# Patient Record
Sex: Female | Born: 1955
Health system: Southern US, Community
[De-identification: ages and names within clinical notes are randomized; demographics above are authoritative.]

## PROBLEM LIST (undated history)

## (undated) DIAGNOSIS — D696 Thrombocytopenia, unspecified: Secondary | ICD-10-CM

## (undated) DIAGNOSIS — R51 Headache: Secondary | ICD-10-CM

## (undated) DIAGNOSIS — M1712 Unilateral primary osteoarthritis, left knee: Secondary | ICD-10-CM

## (undated) DIAGNOSIS — E039 Hypothyroidism, unspecified: Secondary | ICD-10-CM

## (undated) DIAGNOSIS — K219 Gastro-esophageal reflux disease without esophagitis: Secondary | ICD-10-CM

## (undated) DIAGNOSIS — C801 Malignant (primary) neoplasm, unspecified: Secondary | ICD-10-CM

## (undated) DIAGNOSIS — E119 Type 2 diabetes mellitus without complications: Secondary | ICD-10-CM

## (undated) DIAGNOSIS — K589 Irritable bowel syndrome without diarrhea: Secondary | ICD-10-CM

## (undated) HISTORY — DX: Type 2 diabetes mellitus without complications: E11.9

## (undated) HISTORY — DX: Unilateral primary osteoarthritis, left knee: M17.12

## (undated) HISTORY — DX: Thrombocytopenia, unspecified: D69.6

## (undated) HISTORY — PX: ABDOMINAL HYSTERECTOMY: SHX81

## (undated) HISTORY — PX: OTHER SURGICAL HISTORY: SHX169

## (undated) HISTORY — DX: Malignant (primary) neoplasm, unspecified: C80.1

## (undated) HISTORY — PX: TONSILLECTOMY AND ADENOIDECTOMY: SHX28

---
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
## 1989-12-09 HISTORY — PX: APPENDECTOMY: SHX54

## 2004-04-17 ENCOUNTER — Encounter: Payer: Self-pay | Admitting: Internal Medicine

## 2004-05-16 ENCOUNTER — Ambulatory Visit (HOSPITAL_COMMUNITY): Admission: RE | Admit: 2004-05-16 | Discharge: 2004-05-16 | Payer: Self-pay | Admitting: Internal Medicine

## 2005-10-02 ENCOUNTER — Ambulatory Visit: Payer: Self-pay | Admitting: Internal Medicine

## 2009-02-20 ENCOUNTER — Encounter: Payer: Self-pay | Admitting: Internal Medicine

## 2009-03-22 ENCOUNTER — Encounter: Payer: Self-pay | Admitting: Internal Medicine

## 2010-12-24 ENCOUNTER — Ambulatory Visit
Admission: RE | Admit: 2010-12-24 | Discharge: 2010-12-24 | Payer: Self-pay | Source: Home / Self Care | Attending: Internal Medicine | Admitting: Internal Medicine

## 2010-12-24 ENCOUNTER — Other Ambulatory Visit: Payer: Self-pay | Admitting: Internal Medicine

## 2010-12-24 LAB — URINALYSIS
Bilirubin Urine: NEGATIVE
Ketones, ur: NEGATIVE
Leukocytes, UA: NEGATIVE
Nitrite: NEGATIVE
Specific Gravity, Urine: 1.01 (ref 1.000–1.030)
Total Protein, Urine: NEGATIVE
Urine Glucose: NEGATIVE
Urobilinogen, UA: 0.2 (ref 0.0–1.0)
pH: 6 (ref 5.0–8.0)

## 2010-12-24 LAB — BASIC METABOLIC PANEL
BUN: 10 mg/dL (ref 6–23)
CO2: 26 mEq/L (ref 19–32)
Calcium: 8.7 mg/dL (ref 8.4–10.5)
Chloride: 103 mEq/L (ref 96–112)
Creatinine, Ser: 1 mg/dL (ref 0.4–1.2)
GFR: 63.68 mL/min (ref >60.00)
Glucose, Bld: 91 mg/dL (ref 70–99)
Potassium: 3.9 mEq/L (ref 3.5–5.1)
Sodium: 137 mEq/L (ref 135–145)

## 2010-12-24 LAB — CBC WITH DIFFERENTIAL/PLATELET
Basophils Absolute: 0.1 10*3/uL (ref 0.0–0.1)
Basophils Relative: 0.8 % (ref 0.0–3.0)
Eosinophils Absolute: 0.3 10*3/uL (ref 0.0–0.7)
Eosinophils Relative: 5 % (ref 0.0–5.0)
HCT: 45.3 % (ref 36.0–46.0)
Hemoglobin: 15.6 g/dL — ABNORMAL HIGH (ref 12.0–15.0)
Lymphocytes Relative: 35.7 % (ref 12.0–46.0)
Lymphs Abs: 2.3 10*3/uL (ref 0.7–4.0)
MCHC: 34.4 g/dL (ref 30.0–36.0)
MCV: 92.3 fl (ref 78.0–100.0)
Monocytes Absolute: 0.6 10*3/uL (ref 0.1–1.0)
Monocytes Relative: 9.9 % (ref 3.0–12.0)
Neutro Abs: 3.1 10*3/uL (ref 1.4–7.7)
Neutrophils Relative %: 48.6 % (ref 43.0–77.0)
Platelets: 149 10*3/uL — ABNORMAL LOW (ref 150.0–400.0)
RBC: 4.91 Mil/uL (ref 3.87–5.11)
RDW: 13.8 % (ref 11.5–14.6)
WBC: 6.5 10*3/uL (ref 4.5–10.5)

## 2010-12-24 LAB — HEPATIC FUNCTION PANEL
ALT: 66 U/L — ABNORMAL HIGH (ref 0–35)
AST: 48 U/L — ABNORMAL HIGH (ref 0–37)
Albumin: 4 g/dL (ref 3.5–5.2)
Alkaline Phosphatase: 72 U/L (ref 39–117)
Bilirubin, Direct: 0.2 mg/dL (ref 0.0–0.3)
Total Bilirubin: 1 mg/dL (ref 0.3–1.2)
Total Protein: 6.8 g/dL (ref 6.0–8.3)

## 2010-12-24 LAB — LIPID PANEL
Cholesterol: 144 mg/dL (ref 0–200)
HDL: 63.3 mg/dL (ref >39.00)
LDL Cholesterol: 63 mg/dL (ref 0–99)
Total CHOL/HDL Ratio: 2
Triglycerides: 88 mg/dL (ref 0.0–149.0)
VLDL: 17.6 mg/dL (ref 0.0–40.0)

## 2010-12-24 LAB — TSH: TSH: 1.83 u[IU]/mL (ref 0.35–5.50)

## 2011-01-07 ENCOUNTER — Encounter: Payer: Self-pay | Admitting: Internal Medicine

## 2011-01-07 ENCOUNTER — Ambulatory Visit
Admission: RE | Admit: 2011-01-07 | Discharge: 2011-01-07 | Payer: Self-pay | Source: Home / Self Care | Attending: Internal Medicine | Admitting: Internal Medicine

## 2011-01-07 DIAGNOSIS — K760 Fatty (change of) liver, not elsewhere classified: Secondary | ICD-10-CM | POA: Insufficient documentation

## 2011-01-07 DIAGNOSIS — E039 Hypothyroidism, unspecified: Secondary | ICD-10-CM | POA: Insufficient documentation

## 2011-01-07 DIAGNOSIS — R079 Chest pain, unspecified: Secondary | ICD-10-CM | POA: Insufficient documentation

## 2011-01-07 DIAGNOSIS — K589 Irritable bowel syndrome without diarrhea: Secondary | ICD-10-CM | POA: Insufficient documentation

## 2011-01-15 ENCOUNTER — Other Ambulatory Visit: Payer: Self-pay | Admitting: Internal Medicine

## 2011-01-16 NOTE — Assessment & Plan Note (Signed)
Summary: NEW PT CPX/ CHEST PAIN WHEN EXERCISES/NWS  #   Vital Signs:  Patient profile:   55 year old female Height:      68 inches (172.72 cm) Weight:      251.12 pounds (114.15 kg) BMI:     38.32 O2 Sat:      95 % on Room air Temp:     98.4 degrees F (36.89 degrees C) oral Pulse rate:   78 / minute BP sitting:   142 / 88  (left arm) Cuff size:   regular  Vitals Entered By: Orlan Leavens RMA (January 07, 2011 2:03 PM)  O2 Flow:  Room air CC: New patient CPX Is Patient Diabetic? No Pain Assessment Patient in pain? no      Comments PT WANT SHINGLE INJECTION ALSO   Primary Care Provider:  Newt Lukes MD  CC:  New patient CPX.  History of Present Illness: new pt to me and our practice, here to est care  patient is here today for annual physical. Patient feels well and has no complaints.   c/o CP with exettion -  onset   Preventive Screening-Counseling & Management  Alcohol-Tobacco     Alcohol drinks/day: 0     Alcohol Counseling: not indicated; patient does not drink     Smoking Status: never     Tobacco Counseling: not indicated; no tobacco use  Caffeine-Diet-Exercise     Nutrition Referrals: no     Does Patient Exercise: no     Exercise Counseling: to improve exercise regimen     Depression Counseling: not indicated; screening negative for depression  Safety-Violence-Falls     Seat Belt Counseling: not indicated; patient wears seat belts     Helmet Counseling: not applicable     Firearm Counseling: not indicated; uses recommended firearm safety measures     Violence Counseling: not indicated; no violence risk noted     Fall Risk Counseling: not indicated; no significant falls noted  Clinical Review Panels:  Prevention   [facility] Impression:. Small external hemorrhoid. Otherwise normal colonscopy (05/16/2004)  Immunizations   Last Tetanus Booster:  Tdap (01/07/2011)   Last Flu Vaccine:  Historical (10/09/2010)  Lipid  Management   Cholesterol:  144 (12/24/2010)   LDL (bad choesterol):  63 (12/24/2010)   HDL (good cholesterol):  63.30 (12/24/2010)  CBC   WBC:  6.5 (12/24/2010)   RBC:  4.91 (12/24/2010)   Hgb:  15.6 (12/24/2010)   Hct:  45.3 (12/24/2010)   Platelets:  149.0 (12/24/2010)   MCV  92.3 (12/24/2010)   MCHC  34.4 (12/24/2010)   RDW  13.8 (12/24/2010)   PMN:  48.6 (12/24/2010)   Lymphs:  35.7 (12/24/2010)   Monos:  9.9 (12/24/2010)   Eosinophils:  5.0 (12/24/2010)   Basophil:  0.8 (12/24/2010)  Complete Metabolic Panel   Glucose:  91 (12/24/2010)   Sodium:  137 (12/24/2010)   Potassium:  3.9 (12/24/2010)   Chloride:  103 (12/24/2010)   CO2:  26 (12/24/2010)   BUN:  10 (12/24/2010)   Creatinine:  1.0 (12/24/2010)   Albumin:  4.0 (12/24/2010)   Total Protein:  6.8 (12/24/2010)   Calcium:  8.7 (12/24/2010)   Total Bili:  1.0 (12/24/2010)   Alk Phos:  72 (12/24/2010)   SGPT (ALT):  66 (12/24/2010)   SGOT (AST):  48 (12/24/2010)   Current Medications (verified): 1)  Levothyroxine Sodium 100 Mcg Tabs (Levothyroxine Sodium) .... Take 1 By Mouth Once Daily 2)  Hyoscyamine Sulfate 0.125 Mg Tabs (Hyoscyamine Sulfate) .... Take 1 By Mouth Once Daily  Allergies (verified): 1)  ! Darvocet 2)  ! 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 I have reviewed all other systems and they were negative.   Physical Exam  General:  overweight-appearing.  alert, well-developed, well-nourished, and cooperative to  examination.    Head:  Normocephalic and atraumatic without obvious abnormalities. No apparent alopecia or balding. Eyes:  vision grossly intact; pupils equal, round and reactive to light.  conjunctiva and lids normal.    Ears:  normal pinnae bilaterally, without erythema, swelling, or tenderness to palpation. TMs clear, without effusion, or cerumen impaction. Hearing grossly normal bilaterally  Mouth:  teeth and gums in good repair; mucous membranes moist, without lesions or ulcers. oropharynx clear without exudate, no erythema.  Neck:  supple, full ROM, no masses, no thyromegaly; no thyroid nodules or tenderness. no JVD or carotid bruits.   Lungs:  normal respiratory effort, no intercostal retractions or use of accessory muscles; normal breath sounds bilaterally - no crackles and no wheezes.    Heart:  normal rate, regular rhythm, no murmur, and no rub. BLE without edema.  Abdomen:  soft, non-tender, normal bowel sounds, no distention; no masses and no appreciable hepatomegaly or splenomegaly.   Genitalia:  defer gyn Msk:  No deformity or scoliosis noted of thoracic or lumbar spine.   Neurologic:  alert & oriented X3 and cranial nerves II-XII symetrically intact.  strength normal in all extremities, sensation intact to light touch, and gait normal. speech fluent without dysarthria or aphasia; follows commands with good comprehension.  Skin:  no rashes, vesicles, ulcers, or erythema. No nodules or irregularity to palpation.  Psych:  Oriented X3, memory intact for recent and remote, normally interactive, good eye contact, not anxious appearing, not depressed appearing, and not agitated.      Complete Medication List: 1)  Levothyroxine Sodium 100 Mcg Tabs (Levothyroxine sodium) .... Take 1 by mouth once daily 2)  Hyoscyamine Sulfate 0.125 Mg Tabs (Hyoscyamine sulfate) .... Take 1 by mouth once daily  Other Orders: Tdap => 30yrs IM (81191) Admin 1st Vaccine (47829) EKG w/ Interpretation  (93000) Cardiolite (Cardiolite) Radiology Referral (Radiology)  Patient Instructions: 1)  it was good to see you today. 2)  exam and labs reviewed today - chronic elevation liver tests but other tests ok - 3)  we'll make referral for cardiac stress test and for abdominal ultrasound. Our office will contact you regarding these appointments once made.  4)  No aggressive increase in exercise until clear from cardiac testing 5)  Please schedule a follow-up appointment in 6 months to monitor thyroid, call sooner if problems.    Orders Added: 1)  Tdap => 38yrs IM [90715] 2)  Admin 1st Vaccine [90471] 3)  EKG w/ Interpretation [93000] 4)  Cardiolite [Cardiolite] 5)  Radiology Referral [Radiology]   Immunization History:  Influenza Immunization History:    Influenza:  historical (10/09/2010)  Immunizations Administered:  Tetanus Vaccine:    Vaccine Type: Tdap    Site: right deltoid    Mfr: GlaxoSmithKline    Dose: 0.5 ml    Route: IM    Given by: Orlan Leavens RMA    Exp. Date: 09/27/2012    Lot #: [id number]    VIS given: 01/07/11   Immunization History:  Influenza Immunization History:    Influenza:  Historical (10/09/2010)  Immunizations Administered:  Tetanus Vaccine:    Vaccine Type: Tdap    Site: right deltoid    Mfr: GlaxoSmithKline    Dose: 0.5 ml    Route: IM    Given by: Orlan Leavens RMA    Exp. Date: 09/27/2012    Lot #: [id number]    VIS given: 01/07/11    Appended Document: NEW PT CPX/ CHEST PAIN WHEN EXERCISES/NWS  # above note accidentally signed rather than placed on hold -- here is addendum:  HPI -  new pt to me, here to est care patient is also here today for annual physical. Patient feels well and has no complaints.   also c/o CP onexertion onset 6 mo ago, course progressive - no symptoms at rest, only precipitated by treadmill activity pain symptoms located across SS chest  a/w DOE and occ dizzy symptoms, no syncope - +hx same but no prior  cardiac eval -  PMH/SurgHx/FH and Soc Hxas above ROS as above PE as above - labs and review as above  A/P - 1) CPX (v70.0) - Patient has been counseled on age-appropriate routine health concerns for screening and prevention. These are reviewed and up-to-date. Immunizations are up-to-date or declined. Labs and ECG reviewed.  2) CP (786.50) - nonsp exam but few mo progressive exertional CP - symptoms resolve with cessation of treadmill exercise -  may be deconditioning --but given nonsp TWI on EKG and age, feel approp to risk startify with stress test - see order above for cardiolyte - hold on plans for intense exercise routine until cardiac eval complete - consider GI eval and PPI or tx IBS if no ischemia on stress test - explained same to pt who understands and agrees  3) elev LFTs - chronic - dates back to 04/2004 per prior GI records and labs brought with pt today no clear GI or bilary symptoms - ?related to exertion CP nontender exam - ?NASH per prior GI note [place] 02/2008 reviewed - normal anatomy w/o stones, mass recehck [place] now and considerHIDA pt reports neg eval for hepatitis and other "liver probes" in 2005 by GI doc - will send for copy of these records - no EtOH or hx IVDA  4) hypothyroid (244.9)- labs reviewed - cont same   Clinical Lists Changes  Orders: Added new Service order of New Patient 40-64 years (41324) - Signed Added new Service order of New Patient Level III [phone]) - Signed Observations: Added new observation of BASOPHIL %: 1 % (03/22/2009 16:24) Added new observation of % EOS AUTO: 3 % (03/22/2009 16:24) Added new observation of MONOCYTE %: 8 % (03/22/2009 16:24) Added new observation of LYMPH %: 46 % (03/22/2009 16:24) Added new observation of PMN %: 42 % (03/22/2009 16:24) Added new observation of RDW: 13.5 % (03/22/2009 16:24) Added new observation of MCV: 92 fL (03/22/2009 16:24) Added new observation of PLATELETK/UL: 177 K/uL (03/22/2009 16:24) Added  new observation of RBC M/UL: 4.91 M/uL (03/22/2009 16:24) Added new observation of HCT: 45.3 % (03/22/2009 16:24) Added new observation of HGB: 15.2 g/dL (72/53/6644 03:47) Added  new observation of WBC COUNT: 6.0 10*3/microliter (03/22/2009 16:24) Added new observation of TSH: 1.690 microintl units/mL (03/22/2009 16:23) Added new observation of TRIGLYCERIDE: 122 mg/dL (65/78/4696 29:52) Added new observation of HDL: 59 mg/dL (84/13/2440 10:27) Added new observation of LDL: 54 mg/dL (25/36/6440 34:74) Added new observation of CHOLESTEROL: 137 mg/dL (25/95/6387 56:43) Added new observation of ALBUMIN: 4.1 g/dL (32/95/1884 16:60) Added new observation of PROTEIN, TOT: 6.6 g/dL (63/12/6008 93:23) Added new observation of CALCIUM: 8.6 mg/dL (55/73/2202 54:27) Added new observation of ALK PHOS: 80 units/L (03/22/2009 16:23) Added new observation of BILI TOTAL: 0.6 mg/dL (06/01/7627 31:51) Added new observation of SGPT (ALT): 49 units/L (03/22/2009 16:23) Added new observation of SGOT (AST): 29 units/L (03/22/2009 16:23) Added new observation of CO2 PLSM/SER: 18 meq/L (03/22/2009 16:23) Added new observation of CL SERUM: 109 meq/L (03/22/2009 16:23) Added new observation of K SERUM: 4.2 meq/L (03/22/2009 16:23) Added new observation of NA: 139 meq/L (03/22/2009 16:23) Added new observation of CREATININE: 0.81 mg/dL (76/16/0737 10:62) Added new observation of BUN: 9 mg/dL (69/48/5462 70:35) Added new observation of BG RANDOM: 105 mg/dL (00/93/8182 99:37) Added new observation of ABDOM US: Done @ [facility] in [place]  Normal examination  (02/20/2009 16:21)       [place] of Abdomen  Procedure date:  02/20/2009  Findings:      Done @ [facility] in [place]  Normal examination    -  Date:  03/22/2009    Triglycerides: 122    Lymphs: 46

## 2011-01-17 ENCOUNTER — Other Ambulatory Visit: Payer: Self-pay | Admitting: Internal Medicine

## 2011-01-17 DIAGNOSIS — R079 Chest pain, unspecified: Secondary | ICD-10-CM

## 2011-01-17 DIAGNOSIS — R7401 Elevation of levels of liver transaminase levels: Secondary | ICD-10-CM

## 2011-01-21 ENCOUNTER — Ambulatory Visit
Admission: RE | Admit: 2011-01-21 | Discharge: 2011-01-21 | Disposition: A | Discharge status: Home / Self Care | Payer: BC Managed Care – PPO | Source: Ambulatory Visit | Attending: Internal Medicine | Admitting: Internal Medicine

## 2011-01-21 DIAGNOSIS — R7401 Elevation of levels of liver transaminase levels: Secondary | ICD-10-CM

## 2011-01-21 DIAGNOSIS — R079 Chest pain, unspecified: Secondary | ICD-10-CM

## 2011-01-24 NOTE — Letter (Signed)
Summary: [facility]  [facility]   Imported By: Sherian Rein 01/18/2011 10:32:28  _____________________________________________________________________  External Attachment:    Type:   Image     Comment:   External Document

## 2011-02-04 ENCOUNTER — Telehealth (INDEPENDENT_AMBULATORY_CARE_PROVIDER_SITE_OTHER): Payer: Self-pay | Admitting: Radiology

## 2011-02-05 ENCOUNTER — Encounter: Payer: Self-pay | Admitting: Cardiology

## 2011-02-05 ENCOUNTER — Ambulatory Visit (HOSPITAL_COMMUNITY): Payer: BC Managed Care – PPO | Attending: Internal Medicine

## 2011-02-05 DIAGNOSIS — R079 Chest pain, unspecified: Secondary | ICD-10-CM | POA: Insufficient documentation

## 2011-02-05 DIAGNOSIS — R0789 Other chest pain: Secondary | ICD-10-CM

## 2011-02-05 DIAGNOSIS — R0989 Other specified symptoms and signs involving the circulatory and respiratory systems: Secondary | ICD-10-CM

## 2011-02-05 DIAGNOSIS — R0602 Shortness of breath: Secondary | ICD-10-CM

## 2011-02-06 ENCOUNTER — Ambulatory Visit (HOSPITAL_COMMUNITY): Payer: BC Managed Care – PPO

## 2011-02-06 ENCOUNTER — Encounter: Payer: Self-pay | Admitting: Cardiology

## 2011-02-06 DIAGNOSIS — R0989 Other specified symptoms and signs involving the circulatory and respiratory systems: Secondary | ICD-10-CM

## 2011-02-14 NOTE — Assessment & Plan Note (Signed)
Summary: Cardiology Nuclear Testing  Nuclear Med Background Indications for Stress Test: Evaluation for Ischemia     Symptoms: Chest Pressure, Chest Pressure with Exertion, Chest Tightness, Chest Tightness with Exertion, Dizziness, DOE, Fatigue, Palpitations, SOB    Nuclear Pre-Procedure Caffeine/Decaff Intake: none NPO After: 6:30 PM Lungs: clear IV 0.9% NS with Angio Cath: 20g     IV Site: R Hand IV Started by: Milana Na, EMT-P Chest Size (in) 44     Cup Size DD     Height (in): 68 Weight (lb): 248 BMI: 37.84  Nuclear Med Study 1 or 2 day study:  2 day     Stress Test Type:  Stress Reading MD:  Marca Ancona, MD     Referring MD:  V.Leschber Resting Radionuclide:  Technetium 34m Tetrofosmin     Resting Radionuclide Dose:  33.0 mCi  Stress Radionuclide:  Technetium 92m Tetrofosmin     Stress Radionuclide Dose:  33.0 mCi   Stress Protocol Exercise Time (min):  6:30 min     Max HR:  160 bpm     Predicted Max HR:  163 bpm  Max Systolic BP: 194 mm Hg     Percent Max HR:  98.16 %     METS: 7.7 Rate Pressure Product:  60454    Stress Test Technologist:  Milana Na, EMT-P     Nuclear Technologist:  Domenic Polite, CNMT  Rest Procedure  Myocardial perfusion imaging was performed at rest 45 minutes following the intravenous administration of Technetium 60m Tetrofosmin.  Stress Procedure  The patient exercised for 6:30. The patient stopped due to fatigue, sob,and denied any chest pain.  There were non significant ST-T wave changes.  Technetium 15m Tetrofosmin was injected at peak exercise and myocardial perfusion imaging was performed after a brief delay.  QPS Raw Data Images:  Normal; no motion artifact; normal heart/lung ratio. Stress Images:  Normal homogeneous uptake in all areas of the myocardium. Rest Images:  Normal homogeneous uptake in all areas of the myocardium. Subtraction (SDS):  There is no evidence of scar or ischemia. Transient Ischemic Dilatation:   1.06  (Normal <1.22)  Lung/Heart Ratio:  0.32  (Normal <0.45)  Quantitative Gated Spect Images QGS EDV:  63 ml QGS ESV:  15 ml QGS EF:  76 % QGS cine images:  Normal wall motion   Overall Impression  Exercise Capacity: Fair exercise capacity. BP Response: Hypertensive blood pressure response. Clinical Symptoms: Short of breath ECG Impression: No significant ST segment change suggestive of ischemia. Overall Impression: Normal stress nuclear study.

## 2011-02-14 NOTE — Progress Notes (Signed)
Summary: Nuc Pre-Procedure  Phone Note Outgoing Call Call back at Home Phone [phone]   Call placed by: Henrine Screws Call placed to: Patient Reason for Call: Confirm/change Appt Summary of Call: Left message with information on [employer] Information Sheet (see scanned document for details).      Nuclear Med Background Indications for Stress Test: Evaluation for Ischemia     Symptoms: Chest Pain with Exertion, Dizziness, DOE    Nuclear Pre-Procedure Height (in): 68

## 2011-02-25 ENCOUNTER — Telehealth: Payer: Self-pay | Admitting: Internal Medicine

## 2011-02-26 ENCOUNTER — Telehealth: Payer: Self-pay

## 2011-02-26 NOTE — Telephone Encounter (Signed)
Pt called to request results of stress test. Per VAL,results reviewed - normal nuc stress test - no ischemia or heart problems noted - ok to increase exercise program as tolerated - thanks.  Pt advised of same and requested results of [place] And. Pt states she is still having mild chest tightning and believes it is related to her stomach. Please advise if you have seen [place] results, pt was scheduled 02/13.

## 2011-02-26 NOTE — Telephone Encounter (Signed)
i reviewed [place] results from 01/2011 - normal - if continued pain sx, will need ROV to review next step - thanks

## 2011-03-07 NOTE — Progress Notes (Signed)
Summary: test results?  Phone Note Call from Patient Call back at Work Phone [phone]   Caller: Patient Summary of Call: Pt called requesting stress test results. Initial call taken by: Margaret Pyle, CMA,  February 25, 2011 2:32 PM  Follow-up for Phone Call        results reviewed - normal nuc stress test - no ischemia or heart problems noted - ok to increase exercise program as tolerated - thanks Follow-up by: Newt Lukes MD,  February 26, 2011 9:48 AM  Additional Follow-up for Phone Call Additional follow up Details #1::        Done per [facility] Additional Follow-up by: Margaret Pyle, CMA,  February 26, 2011 2:58 PM

## 2011-03-27 ENCOUNTER — Encounter: Payer: Self-pay | Admitting: Internal Medicine

## 2011-04-26 NOTE — Op Note (Signed)
NAME:  Shelly [name], Shelly [name]                      ACCOUNT NO.:  [id number]   MEDICAL RECORD NO.:  [id number]                   PATIENT TYPE:  AMB   LOCATION:  DAY                                  FACILITY:  [facility]   PHYSICIAN:  Lionel December, M.D.                 DATE OF BIRTH:  1956/10/25   DATE OF PROCEDURE:  05/16/2004  DATE OF DISCHARGE:                                 OPERATIVE REPORT   PROCEDURE:  Total colonoscopy.   INDICATIONS FOR PROCEDURE:  The patient is a 56 year old Caucasian female  who is undergoing screening colonoscopy.  While she does not have a first-  degree relative with colon carcinoma, she had a maternal aunt and uncle who  had colon carcinoma in their 13s or 20s.  The procedure and risks were  reviewed with the patient, and informed consent was obtained.   PREOPERATIVE MEDICATIONS:  Demerol 50 mg IV, Versed 6 mg IV in divided dose.   FINDINGS:  The procedure was performed in the endoscopy suite.  The  patient's vital signs and O2 saturations were monitored during the procedure  and remained stable.  The patient was placed in the left lateral recumbent  position and rectal examination performed.  No  Abnormality noted on external or digital exam.  Please note that since I saw  her in the office she had noted some pressure or fullness in her anorectal  area, but this has gotten better.  The Olympus videoscope was placed into  the rectum and advanced into the region of the sigmoid colon and beyond.  The preparation was satisfactory.  The cecum had to be washed free of the  stool to see the underlying mucosa as well as the appendiceal orifice and  ileocecal valve.  Pictures were taken for the record.  A short segment of TI  was also examined and was normal.  As the scope was withdrawn, the colonic  mucosa was once again carefully examined and was normal throughout.  The  rectal mucosa similarly was normal.  The scope was retroflexed to examine  the anorectal  junction, and she had a tiny papilla and hemorrhoid below the  dentate line.  The endoscope was straightened and withdrawn.  The patient  tolerated the procedure well.   FINAL DIAGNOSIS:  Small external hemorrhoid.  Otherwise normal colonoscopy.   RECOMMENDATIONS:  1. High fiber diet plus Citrucel one tablespoon full daily.  2. Anusol-HC suppository, one per rectum at bedtime for one to two weeks.     Prescription given for 15 with a refill.  3. She should continue yearly Hemoccults and consider having next exam in 10     years from now.      ___________________________________________  Lionel December, M.D.   NR/MEDQ  D:  05/16/2004  T:  05/17/2004  Job:  [id number]

## 2011-06-03 ENCOUNTER — Ambulatory Visit (INDEPENDENT_AMBULATORY_CARE_PROVIDER_SITE_OTHER): Payer: BC Managed Care – PPO | Admitting: Internal Medicine

## 2011-06-03 DIAGNOSIS — K6289 Other specified diseases of anus and rectum: Secondary | ICD-10-CM

## 2011-07-01 ENCOUNTER — Encounter (INDEPENDENT_AMBULATORY_CARE_PROVIDER_SITE_OTHER): Payer: Self-pay | Admitting: *Deleted

## 2011-07-03 ENCOUNTER — Telehealth (INDEPENDENT_AMBULATORY_CARE_PROVIDER_SITE_OTHER): Payer: Self-pay | Admitting: *Deleted

## 2011-07-03 NOTE — Telephone Encounter (Signed)
Caryn Bee called about Shelly [name]. She went to Pharmacy to ask about over the counter medications to help with hemorrhoids. When he was going over the ones ,she had already tried them. He is wanting to know if she could try the Rectal Rocket, this is something that they do compound and it requires a RX.

## 2011-07-03 NOTE — Telephone Encounter (Signed)
Rx for Rectal Rocket has been called in to [facility] pharmacy

## 2011-07-19 DIAGNOSIS — K6289 Other specified diseases of anus and rectum: Secondary | ICD-10-CM

## 2011-07-19 DIAGNOSIS — K644 Residual hemorrhoidal skin tags: Secondary | ICD-10-CM

## 2011-07-19 DIAGNOSIS — D126 Benign neoplasm of colon, unspecified: Secondary | ICD-10-CM

## 2011-07-23 ENCOUNTER — Encounter (HOSPITAL_COMMUNITY)
Admission: RE | Disposition: A | Discharge status: Home / Self Care | Payer: Self-pay | Source: Ambulatory Visit | Attending: Internal Medicine

## 2011-07-23 ENCOUNTER — Ambulatory Visit (HOSPITAL_COMMUNITY)
Admission: RE | Admit: 2011-07-23 | Discharge: 2011-07-23 | Disposition: A | Discharge status: Home / Self Care | Payer: BC Managed Care – PPO | Source: Ambulatory Visit | Attending: Internal Medicine | Admitting: Internal Medicine

## 2011-07-23 ENCOUNTER — Other Ambulatory Visit (INDEPENDENT_AMBULATORY_CARE_PROVIDER_SITE_OTHER): Payer: Self-pay | Admitting: Internal Medicine

## 2011-07-23 ENCOUNTER — Encounter (HOSPITAL_COMMUNITY): Payer: Self-pay | Admitting: *Deleted

## 2011-07-23 DIAGNOSIS — D126 Benign neoplasm of colon, unspecified: Secondary | ICD-10-CM | POA: Insufficient documentation

## 2011-07-23 DIAGNOSIS — K644 Residual hemorrhoidal skin tags: Secondary | ICD-10-CM | POA: Insufficient documentation

## 2011-07-23 DIAGNOSIS — K6289 Other specified diseases of anus and rectum: Secondary | ICD-10-CM | POA: Insufficient documentation

## 2011-07-23 HISTORY — DX: Hypothyroidism, unspecified: E03.9

## 2011-07-23 HISTORY — DX: Irritable bowel syndrome, unspecified: K58.9

## 2011-07-23 HISTORY — DX: Gastro-esophageal reflux disease without esophagitis: K21.9

## 2011-07-23 HISTORY — PX: FLEXIBLE SIGMOIDOSCOPY: SHX5431

## 2011-07-23 HISTORY — DX: Headache: R51

## 2011-07-23 LAB — HM COLONOSCOPY

## 2011-07-23 SURGERY — SIGMOIDOSCOPY, FLEXIBLE
Anesthesia: Moderate Sedation

## 2011-07-23 MED ORDER — MIDAZOLAM HCL 5 MG/5ML IJ SOLN
INTRAMUSCULAR | Status: AC
  Filled 2011-07-23: qty 10

## 2011-07-23 MED ORDER — MEPERIDINE HCL 25 MG/ML IJ SOLN
INTRAMUSCULAR | Status: DC | PRN
  Administered 2011-07-23 (×2): 25 mg via INTRAVENOUS

## 2011-07-23 MED ORDER — MEPERIDINE HCL 50 MG/ML IJ SOLN
INTRAMUSCULAR | Status: AC
  Filled 2011-07-23: qty 1

## 2011-07-23 MED ORDER — SODIUM CHLORIDE 0.45 % IV SOLN
Freq: Once | INTRAVENOUS | Status: AC
  Administered 2011-07-23: 07:00:00 via INTRAVENOUS

## 2011-07-23 MED ORDER — MIDAZOLAM HCL 5 MG/5ML IJ SOLN
INTRAMUSCULAR | Status: DC | PRN
  Administered 2011-07-23: 3 mg via INTRAVENOUS
  Administered 2011-07-23: 2 mg via INTRAVENOUS

## 2011-07-23 NOTE — H&P (Signed)
Shelly [name] is an 55 y.o. female.   Chief Complaint: Recurrent rectal pain; patient is here for diagnostic flexible sigmoidoscopy HPI: Patient is 55 year old female who's been experiencing intermittent rectal pain which started suddenly about 3 months ago he has not noted any change in her bowel habits melena or rectal bleeding he has history of IBS and has diarrhea constipation normal stool. She remains on high fiber diet. She denies vaginal discharge or bleeding. Recent pelvic exam was normal. She has been using preparation H. which has helped somewhat; this symptom is worse if she sits for prolonged periods. Rectal exam in the office 7 weeks ago was normal and stool was guaiac-negative Patient's loss colonoscopy was in June 2005 revealing external hemorrhoids only.  Past Medical History  Diagnosis Date  . Hypothyroidism   . Headache   . GERD (gastroesophageal reflux disease)   . Irritable bowel syndrome (IBS)     Past Surgical History  Procedure Date  . Abdominal hysterectomy   . Cyst removed     right arm    Family History  Problem Relation Age of Onset  . Colon cancer Maternal Aunt   . Colon cancer Maternal Uncle    Social History:  reports that she has never smoked. She does not have any smokeless tobacco history on file. She reports that she drinks alcohol. She reports that she does not use illicit drugs.  Allergies:  Allergies  Allergen Reactions  . Propoxyphene Hcl   . Propoxyphene N-Acetaminophen     Medications Prior to Admission  Medication Dose Route Frequency Provider Last Rate Last Dose  . 0.45 % sodium chloride infusion   Intravenous Once Malissa Hippo, MD 20 mL/hr at 07/23/11 0727    . meperidine (DEMEROL) 50 MG/ML injection           . midazolam (VERSED) 5 MG/5ML injection            Medications Prior to Admission  Medication Sig Dispense Refill  . hyoscyamine (LEVBID) 0.375 MG 12 hr tablet Take 0.375 mg by mouth daily.        Marland Kitchen levothyroxine  (SYNTHROID, LEVOTHROID) 100 MCG tablet Take 100 mcg by mouth daily.        Marland Kitchen aluminum hydroxide-magnesium-alginic acid (GENATON 80-20) chewable tablet Chew 1 tablet by mouth 3 (three) times daily as needed.        Marland Kitchen ibuprofen (ADVIL,MOTRIN) 200 MG tablet Take 200 mg by mouth every 6 (six) hours as needed.          No results found for this or any previous visit (from the past 48 hour(s)). No results found.  Review of Systems  Constitutional: Negative for fever and weight loss.  Gastrointestinal: Positive for diarrhea and constipation. Negative for nausea, vomiting, abdominal pain, blood in stool and melena. Heartburn: occasional heartburn with certain foods.    Blood pressure 147/81, pulse 76, temperature 97 F (36.1 C), temperature source Oral, resp. rate 22, height 5\' 8"  (1.727 m), weight 250 lb (113.399 kg), SpO2 97.00%. Physical Exam  Constitutional: She appears well-developed.       Mildly obese  HENT:  Mouth/Throat: Oropharynx is clear and moist.  Eyes: Conjunctivae are normal. No scleral icterus.  Neck: No thyromegaly present.  Cardiovascular: Normal rate, regular rhythm and normal heart sounds.   No murmur heard. Respiratory: Breath sounds normal.  GI: Soft. She exhibits no distension and no mass. There is no tenderness.  Musculoskeletal: She exhibits no edema.  Lymphadenopathy:  She has no cervical adenopathy.  Neurological: She is alert.  Skin: Skin is warm and dry.     Assessment/Plan Recurrent rectal pain. Diagnostic flexible sigmoidoscopy  Erendida Wrenn U 07/23/2011, 7:42 AM

## 2011-07-23 NOTE — Op Note (Signed)
PROCEDURE REPORT   PATIENT:  Shelly [name]  MR#:  [id number] Birthdate:  [birth year], 55 y.o., female Endoscopist:  Dr. Malissa Hippo, MD Procedure Date: 07/23/2011  Procedure:   Flexible Sigmoidoscopy.  Indications: Intermittent rectal pain for the last 3 months. Patient's last colonoscopy was in June 2005 revealing external hemorrhoids.  Informed Consent:  Procedure and risks were reviewed with the patient and informed consent was obtained.  Medications:  Demerol 50  mg IV Versed 5  mg IV  Description of procedure: Patient was placed in left lateral recumbent position and rectal examination performed. No abnormality noted on external or digital exam. Pantex video scope was placed in the rectum and advanced  into the sigmoid colon and beyond.  Scope was passed into mid transverse colon where  she had formed stool. As the scope was slowly withdrawn mucosa was carefully examined. There was 5-6 mm polyp at splenic flexure. It was ablated via cold biopsy. Mucosa of rest of the colon was normal. Rectal mucosa similarly was normal. Scope was retroflexed to examine the anorectal junction and small hemorrhoids noted below the dentate line.  Scope was withdrawn. Patient tolerated the procedure well.  Findings:  5-6 mm polyp at splenic flexure. Mall external hemorrhoids.  Therapeutic/Diagnostic Maneuvers Performed:  Polyp ablated via cold biopsy  Complications:  None    Impression:  No evidence of proctitis. Single small polyp ablated via cold biopsy from splenic flexure. Mall external hemorrhoids.  Recommendations:  Resume usual medications. Continue high-fiber diet. Physician will contact you with biopsy results and further recommendations.  REHMAN,NAJEEB U  07/23/2011 8:10 AM  CC: Dr. No primary provider on file. & Dr. No ref. provider found

## 2011-07-30 ENCOUNTER — Encounter (INDEPENDENT_AMBULATORY_CARE_PROVIDER_SITE_OTHER): Payer: Self-pay | Admitting: *Deleted

## 2011-07-30 ENCOUNTER — Encounter (HOSPITAL_COMMUNITY): Payer: Self-pay | Admitting: Internal Medicine

## 2011-08-08 ENCOUNTER — Telehealth (INDEPENDENT_AMBULATORY_CARE_PROVIDER_SITE_OTHER): Payer: Self-pay | Admitting: *Deleted

## 2011-08-08 NOTE — Telephone Encounter (Signed)
Dr. Karilyn Cota , Lanora Manis left a voicemail for me stating that she was calling you with a progress report after having been on Ibuprofen for 10 days. She says that this did not help her pain and thought from a previous conversation with you that if it did not you were going to order other test. Please advise , Thank You.  Patient also left this number [phone].

## 2011-08-10 NOTE — Telephone Encounter (Signed)
Shelly [name]  please schedule her for abdominopelvic CT with contrast.

## 2011-08-13 ENCOUNTER — Other Ambulatory Visit (INDEPENDENT_AMBULATORY_CARE_PROVIDER_SITE_OTHER): Payer: Self-pay | Admitting: Internal Medicine

## 2011-08-13 DIAGNOSIS — R52 Pain, unspecified: Secondary | ICD-10-CM

## 2011-08-13 NOTE — Telephone Encounter (Signed)
CT A/P sch'd 08/15/11 @ 4:15 (4:00), need to pick up contrast day before -- auth# [id number], patient aware

## 2011-08-13 NOTE — Telephone Encounter (Signed)
Forwarded to Ann Tilley to address 

## 2011-08-15 ENCOUNTER — Ambulatory Visit (HOSPITAL_COMMUNITY)
Admission: RE | Admit: 2011-08-15 | Discharge: 2011-08-15 | Disposition: A | Discharge status: Home / Self Care | Payer: BC Managed Care – PPO | Source: Ambulatory Visit | Attending: Internal Medicine | Admitting: Internal Medicine

## 2011-08-15 DIAGNOSIS — K6289 Other specified diseases of anus and rectum: Secondary | ICD-10-CM | POA: Insufficient documentation

## 2011-08-15 DIAGNOSIS — R52 Pain, unspecified: Secondary | ICD-10-CM

## 2011-08-15 MED ORDER — IOHEXOL 300 MG/ML  SOLN
100.0000 mL | Freq: Once | INTRAMUSCULAR | Status: AC | PRN
  Administered 2011-08-15: 100 mL via INTRAVENOUS

## 2012-05-09 LAB — HM PAP SMEAR

## 2012-05-10 LAB — HM MAMMOGRAPHY

## 2013-03-10 LAB — BASIC METABOLIC PANEL: Potassium: 4.2 mmol/L (ref 3.4–5.3)

## 2013-03-10 LAB — LIPID PANEL
Cholesterol: 147 mg/dL (ref 0–200)
HDL: 63 mg/dL (ref 35–70)
LDL Cholesterol: 59 mg/dL

## 2013-03-10 LAB — CBC AND DIFFERENTIAL
Hemoglobin: 15.3 g/dL (ref 12.0–16.0)
Platelets: 152 10*3/uL (ref 150–399)

## 2013-03-10 LAB — HEPATIC FUNCTION PANEL: ALT: 49 U/L — AB (ref 7–35)

## 2013-04-22 ENCOUNTER — Other Ambulatory Visit (INDEPENDENT_AMBULATORY_CARE_PROVIDER_SITE_OTHER): Payer: Self-pay | Admitting: Internal Medicine

## 2013-06-18 ENCOUNTER — Encounter: Payer: Self-pay | Admitting: Internal Medicine

## 2013-06-18 ENCOUNTER — Ambulatory Visit (INDEPENDENT_AMBULATORY_CARE_PROVIDER_SITE_OTHER): Payer: BC Managed Care – PPO | Admitting: Internal Medicine

## 2013-06-18 ENCOUNTER — Other Ambulatory Visit (INDEPENDENT_AMBULATORY_CARE_PROVIDER_SITE_OTHER): Payer: BC Managed Care – PPO

## 2013-06-18 VITALS — BP 126/88 | HR 82 | Temp 98.2°F | Resp 16 | Wt 269.8 lb

## 2013-06-18 DIAGNOSIS — R2241 Localized swelling, mass and lump, right lower limb: Secondary | ICD-10-CM

## 2013-06-18 DIAGNOSIS — M722 Plantar fascial fibromatosis: Secondary | ICD-10-CM

## 2013-06-18 DIAGNOSIS — R229 Localized swelling, mass and lump, unspecified: Secondary | ICD-10-CM

## 2013-06-18 DIAGNOSIS — R7309 Other abnormal glucose: Secondary | ICD-10-CM

## 2013-06-18 DIAGNOSIS — E039 Hypothyroidism, unspecified: Secondary | ICD-10-CM

## 2013-06-18 DIAGNOSIS — R739 Hyperglycemia, unspecified: Secondary | ICD-10-CM

## 2013-06-18 LAB — HEMOGLOBIN A1C: Hgb A1c MFr Bld: 6.8 % — ABNORMAL HIGH (ref 4.6–6.5)

## 2013-06-18 NOTE — Progress Notes (Signed)
  Subjective:    Patient ID: Shelly [name], female    DOB: 12/08/1956, 57 y.o.   MRN: [id number]  HPI  Last OV with me 12/2010 See CC above  Past Medical History  Diagnosis Date  . Hypothyroidism   . Headache(784.0)   . GERD (gastroesophageal reflux disease)   . Irritable bowel syndrome (IBS)     Review of Systems  Constitutional: Positive for unexpected weight change. Negative for fever and fatigue.  Cardiovascular: Positive for leg swelling. Negative for chest pain and palpitations.  Skin: Negative for color change, pallor, rash and wound.       Objective:   Physical Exam BP 126/88  Pulse 82  Temp(Src) 98.2 F (36.8 C) (Oral)  Resp 16  Wt 269 lb 12.8 oz (122.38 kg)  BMI 41.03 kg/m2  SpO2 95% Wt Readings from Last 3 Encounters:  06/18/13 269 lb 12.8 oz (122.38 kg)  07/23/11 250 lb (113.399 kg)  07/23/11 250 lb (113.399 kg)   Constitutional: She is overweight, but appears well-developed and well-nourished. No distress. Neck: Normal range of motion. Neck supple. No JVD present. No thyromegaly present.  Cardiovascular: Normal rate, regular rhythm and normal heart sounds.  No murmur heard. No BLE edema. Pulmonary/Chest: Effort normal and breath sounds normal. No respiratory distress. She has no wheezes.  Musculoskeletal: palpable and visible soft tissue mass right leg, distal to knee on medial surface. Covers proximal tibial region 8 cm x 10 cm -nontender to palpation. Knee with normal range of motion, no joint effusions. No gross deformities Psychiatric: She has a normal mood and affect. Her behavior is normal. Judgment and thought content normal.   Lab Results  Component Value Date   WBC 6.5 12/24/2010   HGB 15.6* 12/24/2010   HCT 45.3 12/24/2010   PLT 149.0* 12/24/2010   GLUCOSE 91 12/24/2010   CHOL 144 12/24/2010   TRIG 88.0 12/24/2010   HDL 63.30 12/24/2010   LDLCALC 63 12/24/2010   ALT 66* 12/24/2010   AST 48* 12/24/2010   NA 137 12/24/2010   K 3.9 12/24/2010   CL  103 12/24/2010   CREATININE 1.0 12/24/2010   BUN 10 12/24/2010   CO2 26 12/24/2010   TSH 1.83 12/24/2010          Assessment & Plan:   Soft tissue mass, right leg, distal to knee/prox tibia, medial side  Suspect lipoma - reports slowly increasing size greater than 12 months Check MRI to rule out lipoma, cyst, sarcoma or other abnormality Masses otherwise asymptomatic, advised reassurance and conservative observation if lipoma or cyst Further evaluation if MRI suggest other abnormality

## 2013-06-18 NOTE — Assessment & Plan Note (Signed)
Fasting glucose 135 03/2013 reviewed With patient's BMI, high risk for diabetes Check A1c now, initiate DM therapy if greater than 7 No results found for this basename: HGBA1C   The patient is asked to make an attempt to improve diet and exercise patterns to aid in medical management of this problem.

## 2013-06-18 NOTE — Assessment & Plan Note (Signed)
Lab Results  Component Value Date   TSH 3.21 03/10/2013   The current medical regimen is effective;  continue present plan and medications.

## 2013-06-18 NOTE — Patient Instructions (Signed)
It was good to see you today. We have reviewed your prior records including labs and tests today Test(s) ordered today. Your results will be released to MyChart (or called to you) after review, usually within 72hours after test completion. If any changes need to be made, you will be notified at that same time. we'll make referral for MRI of your lef to evaluate the swelling . Our office will contact you regarding appointment(s) once made. You will then be contacted regarding results after review Work on lifestyle changes as discussed (low fat, low carb, increased protein diet; improved exercise efforts; weight loss) to control sugar, blood pressure and cholesterol levels and/or reduce risk of developing other medical problems. Look into [email or web address] or other type of food journal to assist you in this process.

## 2013-07-01 ENCOUNTER — Ambulatory Visit
Admission: RE | Admit: 2013-07-01 | Discharge: 2013-07-01 | Disposition: A | Discharge status: Home / Self Care | Payer: BC Managed Care – PPO | Source: Ambulatory Visit | Attending: Internal Medicine | Admitting: Internal Medicine

## 2013-07-01 DIAGNOSIS — R2241 Localized swelling, mass and lump, right lower limb: Secondary | ICD-10-CM

## 2013-07-01 MED ORDER — GADOBENATE DIMEGLUMINE 529 MG/ML IV SOLN
10.0000 mL | Freq: Once | INTRAVENOUS | Status: AC | PRN
  Administered 2013-07-01: 10 mL via INTRAVENOUS

## 2013-08-20 ENCOUNTER — Other Ambulatory Visit (INDEPENDENT_AMBULATORY_CARE_PROVIDER_SITE_OTHER): Payer: Self-pay | Admitting: Internal Medicine

## 2013-10-20 ENCOUNTER — Encounter: Payer: Self-pay | Admitting: Internal Medicine

## 2013-11-03 ENCOUNTER — Encounter: Payer: Self-pay | Admitting: Internal Medicine

## 2013-12-16 ENCOUNTER — Other Ambulatory Visit (INDEPENDENT_AMBULATORY_CARE_PROVIDER_SITE_OTHER): Payer: Self-pay | Admitting: Internal Medicine

## 2013-12-16 DIAGNOSIS — K589 Irritable bowel syndrome without diarrhea: Secondary | ICD-10-CM

## 2014-03-23 ENCOUNTER — Other Ambulatory Visit (INDEPENDENT_AMBULATORY_CARE_PROVIDER_SITE_OTHER): Payer: Self-pay | Admitting: Internal Medicine

## 2014-03-23 LAB — BASIC METABOLIC PANEL WITH GFR
BUN: 14 mg/dL (ref 4–21)
Creatinine: 0.8 mg/dL (ref 0.5–1.1)
Glucose: 132 mg/dL
Potassium: 4.3 mmol/L (ref 3.4–5.3)
Sodium: 140 mmol/L (ref 137–147)

## 2014-03-23 LAB — LIPID PANEL
Cholesterol: 158 mg/dL (ref 0–200)
HDL: 69 mg/dL (ref 35–70)
LDL Cholesterol: 64 mg/dL
TRIGLYCERIDES: 124 mg/dL (ref 40–160)

## 2014-03-23 LAB — HEPATIC FUNCTION PANEL
ALT: 44 U/L — AB (ref 7–35)
AST: 37 U/L — AB (ref 13–35)
Alkaline Phosphatase: 91 U/L (ref 25–125)
BILIRUBIN, TOTAL: 0.6 mg/dL

## 2014-03-23 LAB — CBC AND DIFFERENTIAL
HCT: 48 % — AB (ref 36–46)
Hemoglobin: 15.7 g/dL (ref 12.0–16.0)
PLATELETS: 149 10*3/uL — AB (ref 150–399)
WBC: 6.5 10^3/mL

## 2014-03-23 LAB — TSH: TSH: 2.7 u[IU]/mL (ref 0.41–5.90)

## 2014-03-23 LAB — HEMOGLOBIN A1C: HEMOGLOBIN A1C: 6.4 % — AB (ref 4.0–6.0)

## 2014-03-24 NOTE — Telephone Encounter (Signed)
Per Dr.Rehman may refill . 

## 2014-06-02 ENCOUNTER — Encounter (INDEPENDENT_AMBULATORY_CARE_PROVIDER_SITE_OTHER): Payer: Self-pay | Admitting: Internal Medicine

## 2014-06-02 ENCOUNTER — Ambulatory Visit (INDEPENDENT_AMBULATORY_CARE_PROVIDER_SITE_OTHER): Payer: BC Managed Care – PPO | Admitting: Internal Medicine

## 2014-06-02 VITALS — BP 100/72 | HR 64 | Temp 97.9°F | Ht 68.0 in | Wt 260.2 lb

## 2014-06-02 DIAGNOSIS — K219 Gastro-esophageal reflux disease without esophagitis: Secondary | ICD-10-CM | POA: Insufficient documentation

## 2014-06-02 MED ORDER — OMEPRAZOLE 40 MG PO CPDR: 40.0000 mg | DELAYED_RELEASE_CAPSULE | Freq: Every day | ORAL | Status: DC

## 2014-06-02 NOTE — Patient Instructions (Signed)
H. Pylori. Omeprazole 40mg  daily

## 2014-06-02 NOTE — Progress Notes (Signed)
Subjective:     Patient ID: Shelly [name], female   DOB: [birth year], 58 y.o.   MRN: [id number]  HPI Presents today with c/o.  Retired in February.   She tells me when you stop the Levsin she started having multiple BMs. She was having 8 BMs a day with urgency. This was around the 1st of March. She started the Levsin within a week.  She says it took about 3 weeks before she was regulated. She tells me she now has burning epigastric region about 2 months ago. Does not occur during the day. If she take the Zantac before she goes to bed, she will not have the pain.  She does not have acid reflux. She avoids gravy, carbonated drinks, tomato based sauces.  Her appetite is good. No unintentional weight loss. No epigastric pain today.  Usually has a BM about twice a day. No melena or bright red rectal bleeding  07/23/2011 Colonoscopy: rectal pain: Impression:  No evidence of proctitis.  Single small polyp ablated via cold biopsy from splenic flexure.  Mall external hemorrhoids. Biopsy: Tubular adenoma.  Next colonoscopy in 5 yrs.   Review of Systems Past Medical History  Diagnosis Date  . Hypothyroidism   . Headache(784.0)   . GERD (gastroesophageal reflux disease)   . Irritable bowel syndrome (IBS)     Past Surgical History  Procedure Laterality Date  . Abdominal hysterectomy    . Cyst removed      right arm  . Flexible sigmoidoscopy  07/23/2011    Procedure: FLEXIBLE SIGMOIDOSCOPY;  Surgeon: Rogene Houston, MD;  Location: [facility];  Service: Endoscopy;  Laterality: N/A;    Allergies  Allergen Reactions  . Propoxyphene Hcl   . Propoxyphene N-Acetaminophen     Current Outpatient Prescriptions on File Prior to Visit  Medication Sig Dispense Refill  . hyoscyamine (LEVSIN SL) 0.125 MG SL tablet PLACE 1 TABLET UNDER THE TONGUE AS NEEDED.  90 tablet  0  . levothyroxine (SYNTHROID, LEVOTHROID) 100 MCG tablet Take 100 mcg by mouth daily.         No current facility-administered  medications on file prior to visit.        Objective:   Physical Exam  Filed Vitals:   06/02/14 1028  BP: 100/72  Pulse: 64  Temp: 97.9 F (36.6 C)  Height: 5\' 8"  (1.727 m)  Weight: 260 lb 3.2 oz (118.026 kg)  Alert and oriented. Skin warm and dry. Oral mucosa is moist.   . Sclera anicteric, conjunctivae is pink. Thyroid not enlarged. No cervical lymphadenopathy. Lungs clear. Heart regular rate and rhythm.  Abdomen is soft. Bowel sounds are positive. No hepatomegaly. No abdominal masses felt. No tenderness.  No edema to lower extremities.        Assessment:    GERD. No symptoms during the day. Some epigastric tenderness at night.     Plan:     H. Pylori. omperazole 40mg  daily. HOB up. GERD diet reviewed with patient.  Do not eat after 8pm.

## 2014-06-03 LAB — H. PYLORI ANTIBODY, IGG: H Pylori IgG: <0.4 {ISR}

## 2014-09-20 ENCOUNTER — Encounter: Payer: Self-pay | Admitting: Internal Medicine

## 2014-09-20 ENCOUNTER — Ambulatory Visit (INDEPENDENT_AMBULATORY_CARE_PROVIDER_SITE_OTHER): Payer: BC Managed Care – PPO | Admitting: Internal Medicine

## 2014-09-20 VITALS — BP 132/88 | HR 74 | Temp 97.9°F | Ht 68.0 in | Wt 264.2 lb

## 2014-09-20 DIAGNOSIS — Z23 Encounter for immunization: Secondary | ICD-10-CM

## 2014-09-20 DIAGNOSIS — E669 Obesity, unspecified: Secondary | ICD-10-CM

## 2014-09-20 DIAGNOSIS — E039 Hypothyroidism, unspecified: Secondary | ICD-10-CM

## 2014-09-20 DIAGNOSIS — R739 Hyperglycemia, unspecified: Secondary | ICD-10-CM

## 2014-09-20 DIAGNOSIS — Z0000 Encounter for general adult medical examination without abnormal findings: Secondary | ICD-10-CM

## 2014-09-20 DIAGNOSIS — Z6835 Body mass index (BMI) 35.0-35.9, adult: Secondary | ICD-10-CM | POA: Insufficient documentation

## 2014-09-20 NOTE — Progress Notes (Signed)
Subjective:    Patient ID: Shelly [name], female    DOB: [birth year], 58 y.o.   MRN: [id number]  HPI  patient is here today for annual physical. Patient feels well and has no complaints.  Also reviewed chronic medical issues and interval medical events  Past Medical History  Diagnosis Date  . Hypothyroidism   . Headache(784.0)   . GERD (gastroesophageal reflux disease)   . Irritable bowel syndrome (IBS)    Family History  Problem Relation Age of Onset  . Colon cancer Maternal Aunt 80  . Colon cancer Maternal Uncle 82  . Diabetes Mother   . Breast cancer Mother 42  . Breast cancer Maternal Grandmother 80  . Atrial fibrillation Mother 65    s/p ablation   History  Substance Use Topics  . Smoking status: Never Smoker   . Smokeless tobacco: Not on file  . Alcohol Use: Yes     Comment: social    Review of Systems  Constitutional: Negative for fatigue and unexpected weight change.  Respiratory: Negative for cough, shortness of breath and wheezing.   Cardiovascular: Negative for chest pain, palpitations and leg swelling.  Gastrointestinal: Negative for nausea, abdominal pain and diarrhea.  Neurological: Negative for dizziness, weakness, light-headedness and headaches.  Psychiatric/Behavioral: Negative for dysphoric mood. The patient is not nervous/anxious.   All other systems reviewed and are negative.      Objective:   Physical Exam  BP 132/88  Pulse 74  Temp(Src) 97.9 F (36.6 C) (Oral)  Ht 5\' 8"  (1.727 m)  Wt 264 lb 4 oz (119.863 kg)  BMI 40.19 kg/m2  SpO2 96% Wt Readings from Last 3 Encounters:  09/20/14 264 lb 4 oz (119.863 kg)  06/02/14 260 lb 3.2 oz (118.026 kg)  06/18/13 269 lb 12.8 oz (122.38 kg)   Constitutional: She is obese, and appears well-developed and well-nourished. No distress.  HENT: Head: Normocephalic and atraumatic. Ears: B TMs ok, no erythema or effusion; Nose: Nose normal. Mouth/Throat: Oropharynx is clear and moist. No  oropharyngeal exudate.  Eyes: Conjunctivae and EOM are normal. Pupils are equal, round, and reactive to light. No scleral icterus.  Neck: Normal range of motion. Neck supple. No JVD present. No thyromegaly present.  Cardiovascular: Normal rate, regular rhythm and normal heart sounds.  No murmur heard. No BLE edema. Pulmonary/Chest: Effort normal and breath sounds normal. No respiratory distress. She has no wheezes.  Abdominal: Soft. Bowel sounds are normal. She exhibits no distension. There is no tenderness. no masses GU/breast: defer to gyn Musculoskeletal: Normal range of motion, no joint effusions. No gross deformities Neurological: She is alert and oriented to person, place, and time. No cranial nerve deficit. Coordination, balance, strength, speech and gait are normal.  Skin: Skin is warm and dry. No rash noted. No erythema.  Psychiatric: She has a normal mood and affect. Her behavior is normal. Judgment and thought content normal.   Lab Results  Component Value Date   WBC 6.5 03/23/2014   HGB 15.7 03/23/2014   HCT 48* 03/23/2014   PLT 149* 03/23/2014   GLUCOSE 91 12/24/2010   CHOL 158 03/23/2014   TRIG 124 03/23/2014   HDL 69 03/23/2014   LDLCALC 64 03/23/2014   ALT 44* 03/23/2014   AST 37* 03/23/2014   NA 140 03/23/2014   K 4.3 03/23/2014   CL 103 12/24/2010   CREATININE 0.8 03/23/2014   BUN 14 03/23/2014   CO2 26 12/24/2010   TSH 2.70 03/23/2014   HGBA1C  6.4* 03/23/2014    Mr Tibia Fibula Right W Wo Contrast  07/01/2013   *RADIOLOGY REPORT*  Clinical Data:  Right leg mass medially.  Increasing in size.  No history of surgery.  MRI RIGHT LEG WITH AND WITHOUT CONTRAST  Technique:  Multiplanar, multisequence MR imaging of the right leg was performed before and after the administration of intravenous contrast.  Contrast: 57mL MULTIHANCE GADOBENATE DIMEGLUMINE 529 MG/ML IV SOLN  Comparison:   None.  Findings: There is slightly more prominent subcutaneous fat along the medial right leg when  compared to the left.  The left leg was included in the field of view for comparison.  There is no mass lesion.  The area of perceived abnormality was marked with a superficial skin markers. Minimal stranding minimal low signal is present within the subcutaneous fat, probably representing scarring associated with prior trauma and / or fat necrosis.  The tibia and fibula appear within normal limits.  Incidental visualization of the left leg demonstrates mild pretibial edema. Less pronounced subcutaneous edema is present in the right leg, probably representing dependent edema. Muscular compartments appear normal and symmetric bilaterally.  IMPRESSION: No mass lesion.  Normal subcutaneous fat identified in the region of palpable abnormality with a tiny area of fat stranding, probably representing fat necrosis associated with prior trauma. Mild subcutaneous edema bilaterally, likely representing dependent edema.   Original Report Authenticated By: Dereck Ligas, M.D.       Assessment & Plan:   CPX/z00.00 - Patient has been counseled on age-appropriate routine health concerns for screening and prevention. These are reviewed and up-to-date. Immunizations are up-to-date or declined. Labs reviewed.  Problem List Items Addressed This Visit   Hyperglycemia      Fasting hyperglycemia (130s) 03/2013 and 03/2014 reviewed With patient's BMI and FH, high risk for diabetes  Lab Results  Component Value Date   HGBA1C 6.4* 03/23/2014   The patient is asked to make an attempt to improve diet and exercise patterns to aid in medical management of this problem.     Hypothyroidism      Lab Results  Component Value Date   TSH 2.70 03/23/2014   The current medical regimen is effective;  continue present plan and medications.    Obese      Wt Readings from Last 3 Encounters:  09/20/14 264 lb 4 oz (119.863 kg)  06/02/14 260 lb 3.2 oz (118.026 kg)  06/18/13 269 lb 12.8 oz (122.38 kg)   The patient is asked to  make an attempt to improve diet and exercise patterns to aid in medical management of this problem.      Other Visit Diagnoses   Routine general medical examination at a health care facility    -  Primary    Need for prophylactic vaccination and inoculation against influenza        Relevant Orders       Flu Vaccine QUAD 36+ mos PF IM (Fluarix Quad PF) (Completed)

## 2014-09-20 NOTE — Patient Instructions (Addendum)
It was good to see you today.  We have reviewed your prior records including labs and tests today  Tdap (tetanus diphtheria and pertussis to cover whooping cough) administered today  Other Health Maintenance reviewed - all other recommended immunizations and age-appropriate screenings are up-to-date.  Medications reviewed and updated, no changes recommended at this time.  Please schedule followup in 12 months for annual exam and labs, call sooner if problems.  Work on lifestyle changes as discussed (low fat, low carb, increased protein diet; improved exercise efforts; weight loss) to control sugar, blood pressure and cholesterol levels and/or reduce risk of developing other medical problems. Look into http://vang.[email or web address] or other type of food journal to assist you in this process.  Health Maintenance Adopting a healthy lifestyle and getting preventive care can go a long way to promote health and wellness. Talk with your health care provider about what schedule of regular examinations is right for you. This is a good chance for you to check in with your provider about disease prevention and staying healthy. In between checkups, there are plenty of things you can do on your own. Experts have done a lot of research about which lifestyle changes and preventive measures are most likely to keep you healthy. Ask your health care provider for more information. WEIGHT AND DIET  Eat a healthy diet  Be sure to include plenty of vegetables, fruits, low-fat dairy products, and lean protein.  Do not eat a lot of foods high in solid fats, added sugars, or salt.  Get regular exercise. This is one of the most important things you can do for your health.  Most adults should exercise for at least 150 minutes each week. The exercise should increase your heart rate and make you sweat (moderate-intensity exercise).  Most adults should also do strengthening exercises at least twice a week. This is in addition to  the moderate-intensity exercise.  Maintain a healthy weight  Body mass index (BMI) is a measurement that can be used to identify possible weight problems. It estimates body fat based on height and weight. Your health care provider can help determine your BMI and help you achieve or maintain a healthy weight.  For females 68 years of age and older:   A BMI below 18.5 is considered underweight.  A BMI of 18.5 to 24.9 is normal.  A BMI of 25 to 29.9 is considered overweight.  A BMI of 30 and above is considered obese.  Watch levels of cholesterol and blood lipids  You should start having your blood tested for lipids and cholesterol at 60 years of age, then have this test every 5 years.  You may need to have your cholesterol levels checked more often if:  Your lipid or cholesterol levels are high.  You are older than 60 years of age.  You are at high risk for heart disease.  CANCER SCREENING   Lung Cancer  Lung cancer screening is recommended for adults 82-86 years old who are at high risk for lung cancer because of a history of smoking.  A yearly low-dose CT scan of the lungs is recommended for people who:  Currently smoke.  Have quit within the past 15 years.  Have at least a 30-pack-year history of smoking. A pack year is smoking an average of one pack of cigarettes a day for 1 year.  Yearly screening should continue until it has been 15 years since you quit.  Yearly screening should stop if you develop a health  problem that would prevent you from having lung cancer treatment.  Breast Cancer  Practice breast self-awareness. This means understanding how your breasts normally appear and feel.  It also means doing regular breast self-exams. Let your health care provider know about any changes, no matter how small.  If you are in your 20s or 30s, you should have a clinical breast exam (CBE) by a health care provider every 1-3 years as part of a regular health  exam.  If you are 73 or older, have a CBE every year. Also consider having a breast X-ray (mammogram) every year.  If you have a family history of breast cancer, talk to your health care provider about genetic screening.  If you are at high risk for breast cancer, talk to your health care provider about having an MRI and a mammogram every year.  Breast cancer gene (BRCA) assessment is recommended for women who have family members with BRCA-related cancers. BRCA-related cancers include:  Breast.  Ovarian.  Tubal.  Peritoneal cancers.  Results of the assessment will determine the need for genetic counseling and BRCA1 and BRCA2 testing. Cervical Cancer Routine pelvic examinations to screen for cervical cancer are no longer recommended for nonpregnant women who are considered low risk for cancer of the pelvic organs (ovaries, uterus, and vagina) and who do not have symptoms. A pelvic examination may be necessary if you have symptoms including those associated with pelvic infections. Ask your health care provider if a screening pelvic exam is right for you.   The Pap test is the screening test for cervical cancer for women who are considered at risk.  If you had a hysterectomy for a problem that was not cancer or a condition that could lead to cancer, then you no longer need Pap tests.  If you are older than 65 years, and you have had normal Pap tests for the past 10 years, you no longer need to have Pap tests.  If you have had past treatment for cervical cancer or a condition that could lead to cancer, you need Pap tests and screening for cancer for at least 20 years after your treatment.  If you no longer get a Pap test, assess your risk factors if they change (such as having a new sexual partner). This can affect whether you should start being screened again.  Some women have medical problems that increase their chance of getting cervical cancer. If this is the case for you, your health  care provider may recommend more frequent screening and Pap tests.  The human papillomavirus (HPV) test is another test that may be used for cervical cancer screening. The HPV test looks for the virus that can cause cell changes in the cervix. The cells collected during the Pap test can be tested for HPV.  The HPV test can be used to screen women 69 years of age and older. Getting tested for HPV can extend the interval between normal Pap tests from three to five years.  An HPV test also should be used to screen women of any age who have unclear Pap test results.  After 60 years of age, women should have HPV testing as often as Pap tests.  Colorectal Cancer  This type of cancer can be detected and often prevented.  Routine colorectal cancer screening usually begins at 60 years of age and continues through 60 years of age.  Your health care provider may recommend screening at an earlier age if you have risk factors for  colon cancer.  Your health care provider may also recommend using home test kits to check for hidden blood in the stool.  A small camera at the end of a tube can be used to examine your colon directly (sigmoidoscopy or colonoscopy). This is done to check for the earliest forms of colorectal cancer.  Routine screening usually begins at age 54.  Direct examination of the colon should be repeated every 5-10 years through 60 years of age. However, you may need to be screened more often if early forms of precancerous polyps or small growths are found. Skin Cancer  Check your skin from head to toe regularly.  Tell your health care provider about any new moles or changes in moles, especially if there is a change in a mole's shape or color.  Also tell your health care provider if you have a mole that is larger than the size of a pencil eraser.  Always use sunscreen. Apply sunscreen liberally and repeatedly throughout the day.  Protect yourself by wearing long sleeves, pants, a  wide-brimmed hat, and sunglasses whenever you are outside. HEART DISEASE, DIABETES, AND HIGH BLOOD PRESSURE   Have your blood pressure checked at least every 1-2 years. High blood pressure causes heart disease and increases the risk of stroke.  If you are between 75 years and 27 years old, ask your health care provider if you should take aspirin to prevent strokes.  Have regular diabetes screenings. This involves taking a blood sample to check your fasting blood sugar level.  If you are at a normal weight and have a low risk for diabetes, have this test once every three years after 60 years of age.  If you are overweight and have a high risk for diabetes, consider being tested at a younger age or more often. PREVENTING INFECTION  Hepatitis B  If you have a higher risk for hepatitis B, you should be screened for this virus. You are considered at high risk for hepatitis B if:  You were born in a country where hepatitis B is common. Ask your health care provider which countries are considered high risk.  Your parents were born in a high-risk country, and you have not been immunized against hepatitis B (hepatitis B vaccine).  You have HIV or AIDS.  You use needles to inject street drugs.  You live with someone who has hepatitis B.  You have had sex with someone who has hepatitis B.  You get hemodialysis treatment.  You take certain medicines for conditions, including cancer, organ transplantation, and autoimmune conditions. Hepatitis C  Blood testing is recommended for:  Everyone born from [birth year] through 1967.  Anyone with known risk factors for hepatitis C. Sexually transmitted infections (STIs)  You should be screened for sexually transmitted infections (STIs) including gonorrhea and chlamydia if:  You are sexually active and are younger than 60 years of age.  You are older than 60 years of age and your health care provider tells you that you are at risk for this type of  infection.  Your sexual activity has changed since you were last screened and you are at an increased risk for chlamydia or gonorrhea. Ask your health care provider if you are at risk.  If you do not have HIV, but are at risk, it may be recommended that you take a prescription medicine daily to prevent HIV infection. This is called pre-exposure prophylaxis (PrEP). You are considered at risk if:  You are sexually active and do  not regularly use condoms or know the HIV status of your partner(s).  You take drugs by injection.  You are sexually active with a partner who has HIV. Talk with your health care provider about whether you are at high risk of being infected with HIV. If you choose to begin PrEP, you should first be tested for HIV. You should then be tested every 3 months for as long as you are taking PrEP.  PREGNANCY   If you are premenopausal and you may become pregnant, ask your health care provider about preconception counseling.  If you may become pregnant, take 400 to 800 micrograms (mcg) of folic acid every day.  If you want to prevent pregnancy, talk to your health care provider about birth control (contraception). OSTEOPOROSIS AND MENOPAUSE   Osteoporosis is a disease in which the bones lose minerals and strength with aging. This can result in serious bone fractures. Your risk for osteoporosis can be identified using a bone density scan.  If you are 56 years of age or older, or if you are at risk for osteoporosis and fractures, ask your health care provider if you should be screened.  Ask your health care provider whether you should take a calcium or vitamin D supplement to lower your risk for osteoporosis.  Menopause may have certain physical symptoms and risks.  Hormone replacement therapy may reduce some of these symptoms and risks. Talk to your health care provider about whether hormone replacement therapy is right for you.  HOME CARE INSTRUCTIONS   Schedule regular  health, dental, and eye exams.  Stay current with your immunizations.   Do not use any tobacco products including cigarettes, chewing tobacco, or electronic cigarettes.  If you are pregnant, do not drink alcohol.  If you are breastfeeding, limit how much and how often you drink alcohol.  Limit alcohol intake to no more than 1 drink per day for nonpregnant women. One drink equals 12 ounces of beer, 5 ounces of wine, or 1 ounces of hard liquor.  Do not use street drugs.  Do not share needles.  Ask your health care provider for help if you need support or information about quitting drugs.  Tell your health care provider if you often feel depressed.  Tell your health care provider if you have ever been abused or do not feel safe at home. Document Released: 06/10/2011 Document Revised: 04/11/2014 Document Reviewed: 10/27/2013 [facility] Patient Information 2015 [place], [place]. This information is not intended to replace advice given to you by your health care provider. Make sure you discuss any questions you have with your health care provider. Exercise to Lose Weight Exercise and a healthy diet may help you lose weight. Your doctor may suggest specific exercises. EXERCISE IDEAS AND TIPS  Choose low-cost things you enjoy doing, such as walking, bicycling, or exercising to workout videos.  Take stairs instead of the elevator.  Walk during your lunch break.  Park your car further away from work or school.  Go to a gym or an exercise class.  Start with 5 to 10 minutes of exercise each day. Build up to 30 minutes of exercise 4 to 6 days a week.  Wear shoes with good support and comfortable clothes.  Stretch before and after working out.  Work out until you breathe harder and your heart beats faster.  Drink extra water when you exercise.  Do not do so much that you hurt yourself, feel dizzy, or get very short of breath. Exercises that  burn about 150 calories:  Running 1   miles in 15 minutes.  Playing volleyball for 45 to 60 minutes.  Washing and waxing a car for 45 to 60 minutes.  Playing touch football for 45 minutes.  Walking 1  miles in 35 minutes.  Pushing a stroller 1  miles in 30 minutes.  Playing basketball for 30 minutes.  Raking leaves for 30 minutes.  Bicycling 5 miles in 30 minutes.  Walking 2 miles in 30 minutes.  Dancing for 30 minutes.  Shoveling snow for 15 minutes.  Swimming laps for 20 minutes.  Walking up stairs for 15 minutes.  Bicycling 4 miles in 15 minutes.  Gardening for 30 to 45 minutes.  Jumping rope for 15 minutes.  Washing windows or floors for 45 to 60 minutes. Document Released: 12/28/2010 Document Revised: 02/17/2012 Document Reviewed: 12/28/2010 [facility] Patient Information 2015 [place], [place]. This information is not intended to replace advice given to you by your health care provider. Make sure you discuss any questions you have with your health care provider.

## 2014-09-20 NOTE — Assessment & Plan Note (Signed)
Lab Results  Component Value Date   TSH 2.70 03/23/2014   The current medical regimen is effective;  continue present plan and medications.

## 2014-09-20 NOTE — Assessment & Plan Note (Signed)
Wt Readings from Last 3 Encounters:  09/20/14 264 lb 4 oz (119.863 kg)  06/02/14 260 lb 3.2 oz (118.026 kg)  06/18/13 269 lb 12.8 oz (122.38 kg)   The patient is asked to make an attempt to improve diet and exercise patterns to aid in medical management of this problem.

## 2014-09-20 NOTE — Assessment & Plan Note (Signed)
Fasting hyperglycemia (130s) 03/2013 and 03/2014 reviewed With patient's BMI and FH, high risk for diabetes  Lab Results  Component Value Date   HGBA1C 6.4* 03/23/2014   The patient is asked to make an attempt to improve diet and exercise patterns to aid in medical management of this problem.

## 2014-09-20 NOTE — Progress Notes (Signed)
Pre visit review using our clinic review tool, if applicable. No additional management support is needed unless otherwise documented below in the visit note. 

## 2014-10-07 ENCOUNTER — Ambulatory Visit (INDEPENDENT_AMBULATORY_CARE_PROVIDER_SITE_OTHER): Payer: BC Managed Care – PPO | Admitting: *Deleted

## 2014-10-07 DIAGNOSIS — Z23 Encounter for immunization: Secondary | ICD-10-CM

## 2014-10-17 ENCOUNTER — Encounter: Payer: Self-pay | Admitting: Cardiology

## 2014-12-15 ENCOUNTER — Other Ambulatory Visit (INDEPENDENT_AMBULATORY_CARE_PROVIDER_SITE_OTHER): Payer: Self-pay | Admitting: Internal Medicine

## 2015-01-02 ENCOUNTER — Other Ambulatory Visit (INDEPENDENT_AMBULATORY_CARE_PROVIDER_SITE_OTHER): Payer: Self-pay | Admitting: Internal Medicine

## 2015-03-20 ENCOUNTER — Other Ambulatory Visit (INDEPENDENT_AMBULATORY_CARE_PROVIDER_SITE_OTHER): Payer: Self-pay | Admitting: Internal Medicine

## 2015-05-24 ENCOUNTER — Ambulatory Visit (INDEPENDENT_AMBULATORY_CARE_PROVIDER_SITE_OTHER): Payer: BC Managed Care – PPO | Admitting: Internal Medicine

## 2015-05-24 ENCOUNTER — Other Ambulatory Visit (INDEPENDENT_AMBULATORY_CARE_PROVIDER_SITE_OTHER): Payer: Self-pay | Admitting: *Deleted

## 2015-05-24 ENCOUNTER — Encounter (INDEPENDENT_AMBULATORY_CARE_PROVIDER_SITE_OTHER): Payer: Self-pay | Admitting: Internal Medicine

## 2015-05-24 VITALS — BP 108/72 | HR 80 | Temp 98.1°F | Ht 68.0 in | Wt 261.7 lb

## 2015-05-24 DIAGNOSIS — Z1211 Encounter for screening for malignant neoplasm of colon: Secondary | ICD-10-CM | POA: Diagnosis not present

## 2015-05-24 DIAGNOSIS — K6289 Other specified diseases of anus and rectum: Secondary | ICD-10-CM

## 2015-05-24 MED ORDER — HYDROCORTISONE 2.5 % RE CREA: 1.0000 "application " | TOPICAL_CREAM | Freq: Two times a day (BID) | RECTAL | Status: DC

## 2015-05-24 NOTE — Progress Notes (Signed)
Subjective:    Patient ID: Shelly [name], female    DOB: [birth year], 59 y.o.   MRN: [id number]  HPI Here today with c/o pressure in her rectum.  She has been using a lot of Preparation H. She says she can feel a pee size knot just inside of her rectum. She says by the end of the day her rectum is burning and uncomfortable. She is having 1-2 BMs a day. No melena or BRRB. Appetite is good. No weight loss.  No abdominal pain. She does occasionally have irritable.  Her acid reflux is controlled with the Omeprazole.  Hx of colon cancer in her mother's sister age 63 Hx of elevated liver enzymes and fatty liver.  Recent labs were normal. 02/28/2015 ALP 101, AST 26, ALT 29, bili 0.5, H and H 15.8 and 48, MCV 91, Platelet ct 157    07/23/2011 Flexible Sigmoidoscopy.  Indications: Intermittent rectal pain for the last 3 months. Patient's last colonoscopy was in June 2005 revealing external hemorrhoids. Impression:  No evidence of proctitis. Single small polyp ablated via cold biopsy from splenic flexure. Mall external hemorrhoids.    :     05/16/2004 Total colonoscopy.  INDICATIONS FOR PROCEDURE: The patient is a 59 year old Caucasian female who is undergoing screening colonoscopy. While she does not have a first- degree relative with colon carcinoma, she had a maternal aunt and uncle who had colon carcinoma in their 46s or 91s. The procedure and risks were reviewed with the patient, and informed consent was obtained. FINAL DIAGNOSIS: Small external hemorrhoid. Otherwise normal colonoscopy.  Review of Systems Past Medical History  Diagnosis Date  . Hypothyroidism   . Headache(784.0)   . GERD (gastroesophageal reflux disease)   . Irritable bowel syndrome (IBS)     Past Surgical History  Procedure Laterality Date  . Abdominal hysterectomy    . Cyst removed      right arm  . Flexible sigmoidoscopy  07/23/2011    Procedure: FLEXIBLE  SIGMOIDOSCOPY;  Surgeon: Rogene Houston, MD;  Location: [facility];  Service: Endoscopy;  Laterality: N/A;    Allergies  Allergen Reactions  . Propoxyphene Hcl   . Propoxyphene N-Acetaminophen     Current Outpatient Prescriptions on File Prior to Visit  Medication Sig Dispense Refill  . hyoscyamine (LEVSIN SL) 0.125 MG SL tablet PLACE 1 TABLET UNDER THE TONGUE AS NEEDED. 90 tablet 0  . levothyroxine (SYNTHROID, LEVOTHROID) 100 MCG tablet Take 100 mcg by mouth daily.      Marland Kitchen omeprazole (PRILOSEC) 40 MG capsule TAKE 1 TABLET DAILY. 30 capsule 3   No current facility-administered medications on file prior to visit.        Objective:   Physical Exam Blood pressure 108/72, pulse 80, temperature 98.1 F (36.7 C), height 5\' 8"  (1.727 m), weight 261 lb 11.2 oz (118.706 kg). Alert and oriented. Skin warm and dry. Oral mucosa is moist.   . Sclera anicteric, conjunctivae is pink. Thyroid not enlarged. No cervical lymphadenopathy. Lungs clear. Heart regular rate and rhythm.  Abdomen is soft. Bowel sounds are positive. No hepatomegaly. No abdominal masses felt. No tenderness.  No edema to lower extremities.  No mass or bump felt in rectum.        Assessment & Plan:  Rectal pain. ? Etiology. I did not feel any masses today. Suspect hemorrhoid. She is due for a colonoscopy. Will go ahead and schedule.  Fatty liver/elevated liver enzymes. Liver enzymes normal with report today. Patient was  encouraged to diet and exericse. Rx for Anusol cream BID to [facility] pharmacy

## 2015-05-24 NOTE — Patient Instructions (Signed)
Colonoscopy.  The risks and benefits such as perforation, bleeding, and infection were reviewed with the patient and is agreeable. 

## 2015-05-29 ENCOUNTER — Telehealth (INDEPENDENT_AMBULATORY_CARE_PROVIDER_SITE_OTHER): Payer: Self-pay | Admitting: *Deleted

## 2015-05-29 NOTE — Telephone Encounter (Signed)
Patient needs osmo prep -- has salt sensitivity can't do trilyte or movi prep

## 2015-06-05 MED ORDER — SOD PHOS MONO-SOD PHOS DIBASIC 1.102-0.398 G PO TABS: 32.0000 | ORAL_TABLET | Freq: Once | ORAL | Status: DC

## 2015-06-19 ENCOUNTER — Encounter (HOSPITAL_COMMUNITY)
Admission: RE | Disposition: A | Discharge status: Home / Self Care | Payer: Self-pay | Source: Ambulatory Visit | Attending: Internal Medicine

## 2015-06-19 ENCOUNTER — Encounter (HOSPITAL_COMMUNITY): Payer: Self-pay | Admitting: *Deleted

## 2015-06-19 ENCOUNTER — Ambulatory Visit (HOSPITAL_COMMUNITY)
Admission: RE | Admit: 2015-06-19 | Discharge: 2015-06-19 | Disposition: A | Discharge status: Home / Self Care | Payer: BC Managed Care – PPO | Source: Ambulatory Visit | Attending: Internal Medicine | Admitting: Internal Medicine

## 2015-06-19 DIAGNOSIS — D123 Benign neoplasm of transverse colon: Secondary | ICD-10-CM | POA: Insufficient documentation

## 2015-06-19 DIAGNOSIS — Z79899 Other long term (current) drug therapy: Secondary | ICD-10-CM | POA: Diagnosis not present

## 2015-06-19 DIAGNOSIS — K6389 Other specified diseases of intestine: Secondary | ICD-10-CM | POA: Diagnosis not present

## 2015-06-19 DIAGNOSIS — K644 Residual hemorrhoidal skin tags: Secondary | ICD-10-CM | POA: Diagnosis not present

## 2015-06-19 DIAGNOSIS — K6289 Other specified diseases of anus and rectum: Secondary | ICD-10-CM

## 2015-06-19 DIAGNOSIS — K219 Gastro-esophageal reflux disease without esophagitis: Secondary | ICD-10-CM | POA: Insufficient documentation

## 2015-06-19 DIAGNOSIS — Z800 Family history of malignant neoplasm of digestive organs: Secondary | ICD-10-CM | POA: Insufficient documentation

## 2015-06-19 DIAGNOSIS — K589 Irritable bowel syndrome without diarrhea: Secondary | ICD-10-CM | POA: Insufficient documentation

## 2015-06-19 DIAGNOSIS — Z1211 Encounter for screening for malignant neoplasm of colon: Secondary | ICD-10-CM | POA: Insufficient documentation

## 2015-06-19 DIAGNOSIS — K648 Other hemorrhoids: Secondary | ICD-10-CM | POA: Diagnosis not present

## 2015-06-19 DIAGNOSIS — Z86010 Personal history of colonic polyps: Secondary | ICD-10-CM | POA: Insufficient documentation

## 2015-06-19 DIAGNOSIS — E039 Hypothyroidism, unspecified: Secondary | ICD-10-CM | POA: Diagnosis not present

## 2015-06-19 HISTORY — PX: COLONOSCOPY: SHX5424

## 2015-06-19 SURGERY — COLONOSCOPY
Anesthesia: Moderate Sedation

## 2015-06-19 MED ORDER — MEPERIDINE HCL 50 MG/ML IJ SOLN
INTRAMUSCULAR | Status: AC
  Filled 2015-06-19: qty 1

## 2015-06-19 MED ORDER — MIDAZOLAM HCL 5 MG/5ML IJ SOLN
INTRAMUSCULAR | Status: DC | PRN
  Administered 2015-06-19 (×5): 2 mg via INTRAVENOUS

## 2015-06-19 MED ORDER — MIDAZOLAM HCL 5 MG/5ML IJ SOLN
INTRAMUSCULAR | Status: AC
  Filled 2015-06-19: qty 10

## 2015-06-19 MED ORDER — SODIUM CHLORIDE 0.9 % IV SOLN
INTRAVENOUS | Status: DC
  Administered 2015-06-19: 08:00:00 via INTRAVENOUS

## 2015-06-19 MED ORDER — STERILE WATER FOR IRRIGATION IR SOLN
Status: DC | PRN
  Administered 2015-06-19: 09:00:00

## 2015-06-19 MED ORDER — MEPERIDINE HCL 50 MG/ML IJ SOLN
INTRAMUSCULAR | Status: DC | PRN
  Administered 2015-06-19 (×2): 25 mg via INTRAVENOUS

## 2015-06-19 MED ORDER — SIMETHICONE 40 MG/0.6ML PO SUSP
ORAL | Status: AC
  Filled 2015-06-19: qty 0.6

## 2015-06-19 NOTE — Op Note (Signed)
COLONOSCOPY PROCEDURE REPORT  PATIENT:  Shelly [name]  MR#:  [id number] Birthdate:  20-Jan-1956, 59 y.o., female Endoscopist:  Dr. Rogene Houston, MD Referred By:  Dr. Gwendolyn Grant, MD  Procedure Date: 06/19/2015  Procedure:   Colonoscopy  Indications:  Patient is 59 year old Caucasian female was undergoing screening colonoscopy. Last colonoscopy was in August 2005. She had flexible sigmoidoscopy in August 2012 with removal of single small polyp. She denies abdominal pain or rectal bleeding. Family history significant for colonic polyps in a sister and her maternal aunt had colon carcinoma in her late 92s. She does not have first-degree relative with CRC.  Informed Consent:  The procedure and risks were reviewed with the patient and informed consent was obtained.  Medications:  Demerol 50 mg IV Versed 10 mg IV  Description of procedure:  After a digital rectal exam was performed, that colonoscope was advanced from the anus through the rectum and colon to the area of the cecum, ileocecal valve and appendiceal orifice. The cecum was deeply intubated. These structures were well-seen and photographed for the record. From the level of the cecum and ileocecal valve, the scope was slowly and cautiously withdrawn. The mucosal surfaces were carefully surveyed utilizing scope tip to flexion to facilitate fold flattening as needed. The scope was pulled down into the rectum where a thorough exam including retroflexion was performed.  Findings:   Prep excellent. 4 mm polyp ablated via cold biopsy from proximal transverse colon. Mucosa of rest of the colon and rectum was normal. Small hemorrhoids below the dentate line along with anal papilla.   Therapeutic/Diagnostic Maneuvers Performed:  See above  Complications:  None  Cecal Withdrawal Time:  10 minutes  Impression:  Examination performed to cecum. 4 mm polyp ablated via cold biopsy from occipital transverse colon. Small external  hemorrhoids and single anal papilla.  Recommendations:  Standard instructions given. I will contact patient with biopsy results and further recommendations.  REHMAN,NAJEEB U  06/19/2015 9:16 AM  CC: Dr. Gwendolyn Grant, MD & Dr. Rayne Du ref. provider found

## 2015-06-19 NOTE — H&P (Signed)
Shelly [name] is an 59 y.o. female.   Chief Complaint: Patient is here for colonoscopy. HPI: She is 59 year old Caucasian female who is here for colonoscopy primarily for screening purposes. Her last exam was in August 2005. She had flexible sigmoidoscopy in August 2012 because or rectal pain. Small polyp was removed on that exam. She denies abdominal pain change in bowel habits or rectal bleeding. History significant for colonic polyps in a sister. Maternal aunt had colon carcinoma in her late 23s.  Past Medical History  Diagnosis Date  . Hypothyroidism   . Headache(784.0)   . GERD (gastroesophageal reflux disease)   . Irritable bowel syndrome (IBS)     Past Surgical History  Procedure Laterality Date  . Abdominal hysterectomy    . Cyst removed      right arm  . Flexible sigmoidoscopy  07/23/2011    Procedure: FLEXIBLE SIGMOIDOSCOPY;  Surgeon: Rogene Houston, MD;  Location: [facility];  Service: Endoscopy;  Laterality: N/A;    Family History  Problem Relation Age of Onset  . Colon cancer Maternal Aunt 80  . Colon cancer Maternal Uncle 71  . Diabetes Mother   . Breast cancer Mother 7  . Breast cancer Maternal Grandmother 80  . Atrial fibrillation Mother 84    s/p ablation   Social History:  reports that she has never smoked. She does not have any smokeless tobacco history on file. She reports that she drinks alcohol. She reports that she does not use illicit drugs.  Allergies:  Allergies  Allergen Reactions  . Propoxyphene N-Acetaminophen Hives and Rash    Medications Prior to Admission  Medication Sig Dispense Refill  . hydrocortisone (ANUSOL-HC) 2.5 % rectal cream Place 1 application rectally 2 (two) times daily. 30 g 1  . hyoscyamine (LEVSIN SL) 0.125 MG SL tablet PLACE 1 TABLET UNDER THE TONGUE AS NEEDED. (Patient taking differently: PLACE 1 TABLET UNDER THE TONGUE AS NEEDED ABDOMINAL PAIN) 90 tablet 0  . levothyroxine (SYNTHROID, LEVOTHROID) 100 MCG tablet  Take 100 mcg by mouth daily before breakfast.    . loratadine (CLARITIN) 10 MG tablet Take 10 mg by mouth daily.    Marland Kitchen omeprazole (PRILOSEC) 40 MG capsule TAKE 1 TABLET DAILY. 30 capsule 3  . sodium phosphates (OSMOPREP) 1.102-0.398 G TABS Take 32 tablets by mouth once. 32 tablet 0    No results found for this or any previous visit (from the past 48 hour(s)). No results found.  ROS  Blood pressure 124/86, pulse 72, temperature 97.8 F (36.6 C), temperature source Oral, resp. rate 18, height 5\' 9"  (1.753 m), weight 255 lb (115.667 kg), SpO2 96 %. Physical Exam  Constitutional:  Well-developed obese Caucasian female in NAD  HENT:  Mouth/Throat: Oropharynx is clear and moist.  Eyes: Conjunctivae are normal. No scleral icterus.  Neck: No thyromegaly present.  Cardiovascular: Normal rate, regular rhythm and normal heart sounds.   No murmur heard. Respiratory: Effort normal and breath sounds normal.  GI: Soft. She exhibits no distension and no mass. There is no tenderness.  Musculoskeletal: She exhibits no edema.  Lymphadenopathy:    She has no cervical adenopathy.  Neurological: She is alert.  Skin: Skin is warm and dry.     Assessment/Plan Screening colonoscopy.  Daveion Robar U 06/19/2015, 8:37 AM

## 2015-06-19 NOTE — Discharge Instructions (Signed)
Resume usual medications and diet. No driving for 24 hours. Physician will call with biopsy results.  Colonoscopy, Care After Refer to this sheet in the next few weeks. These instructions provide you with information on caring for yourself after your procedure. Your health care provider may also give you more specific instructions. Your treatment has been planned according to current medical practices, but problems sometimes occur. Call your health care provider if you have any problems or questions after your procedure. WHAT TO EXPECT AFTER THE PROCEDURE  After your procedure, it is typical to have the following:  A small amount of blood in your stool.  Moderate amounts of gas and mild abdominal cramping or bloating. HOME CARE INSTRUCTIONS  Do not drive, operate machinery, or sign important documents for 24 hours.  You may shower and resume your regular physical activities, but move at a slower pace for the first 24 hours.  Take frequent rest periods for the first 24 hours.  Walk around or put a warm pack on your abdomen to help reduce abdominal cramping and bloating.  Drink enough fluids to keep your urine clear or pale yellow.  You may resume your normal diet as instructed by your health care provider. Avoid heavy or fried foods that are hard to digest.  Avoid drinking alcohol for 24 hours or as instructed by your health care provider.  Only take over-the-counter or prescription medicines as directed by your health care provider.  If a tissue sample (biopsy) was taken during your procedure:  Do not take aspirin or blood thinners for 7 days, or as instructed by your health care provider.  Do not drink alcohol for 7 days, or as instructed by your health care provider.  Eat soft foods for the first 24 hours. SEEK MEDICAL CARE IF: You have persistent spotting of blood in your stool 2-3 days after the procedure. SEEK IMMEDIATE MEDICAL CARE IF:  You have more than a small  spotting of blood in your stool.  You pass large blood clots in your stool.  Your abdomen is swollen (distended).  You have nausea or vomiting.  You have a fever.  You have increasing abdominal pain that is not relieved with medicine. Document Released: 07/09/2004 Document Revised: 09/15/2013 Document Reviewed: 08/02/2013 [facility] Patient Information 2015 [place], [place]. This information is not intended to replace advice given to you by your health care provider. Make sure you discuss any questions you have with your health care provider.

## 2015-06-20 ENCOUNTER — Encounter (HOSPITAL_COMMUNITY): Payer: Self-pay | Admitting: Internal Medicine

## 2016-04-09 ENCOUNTER — Other Ambulatory Visit (INDEPENDENT_AMBULATORY_CARE_PROVIDER_SITE_OTHER): Payer: Self-pay | Admitting: Internal Medicine

## 2016-05-07 ENCOUNTER — Other Ambulatory Visit (INDEPENDENT_AMBULATORY_CARE_PROVIDER_SITE_OTHER): Payer: Self-pay | Admitting: Internal Medicine

## 2016-09-04 ENCOUNTER — Encounter: Payer: Self-pay | Admitting: Student

## 2016-12-16 ENCOUNTER — Other Ambulatory Visit (INDEPENDENT_AMBULATORY_CARE_PROVIDER_SITE_OTHER): Payer: BC Managed Care – PPO

## 2016-12-16 ENCOUNTER — Other Ambulatory Visit (HOSPITAL_COMMUNITY)
Admission: RE | Admit: 2016-12-16 | Discharge: 2016-12-16 | Disposition: A | Discharge status: Home / Self Care | Payer: BC Managed Care – PPO | Source: Ambulatory Visit | Attending: Nurse Practitioner | Admitting: Nurse Practitioner

## 2016-12-16 ENCOUNTER — Encounter: Payer: Self-pay | Admitting: Nurse Practitioner

## 2016-12-16 ENCOUNTER — Ambulatory Visit (INDEPENDENT_AMBULATORY_CARE_PROVIDER_SITE_OTHER): Payer: BC Managed Care – PPO | Admitting: Nurse Practitioner

## 2016-12-16 VITALS — BP 132/68 | HR 64 | Temp 98.7°F | Resp 16 | Ht 68.0 in | Wt 270.0 lb

## 2016-12-16 DIAGNOSIS — Z0001 Encounter for general adult medical examination with abnormal findings: Secondary | ICD-10-CM | POA: Diagnosis not present

## 2016-12-16 DIAGNOSIS — Z1231 Encounter for screening mammogram for malignant neoplasm of breast: Secondary | ICD-10-CM | POA: Diagnosis not present

## 2016-12-16 DIAGNOSIS — E039 Hypothyroidism, unspecified: Secondary | ICD-10-CM

## 2016-12-16 DIAGNOSIS — R7401 Elevation of levels of liver transaminase levels: Secondary | ICD-10-CM

## 2016-12-16 DIAGNOSIS — R740 Nonspecific elevation of levels of transaminase and lactic acid dehydrogenase [LDH]: Secondary | ICD-10-CM | POA: Diagnosis not present

## 2016-12-16 DIAGNOSIS — Z0000 Encounter for general adult medical examination without abnormal findings: Secondary | ICD-10-CM

## 2016-12-16 DIAGNOSIS — E669 Obesity, unspecified: Secondary | ICD-10-CM

## 2016-12-16 DIAGNOSIS — IMO0001 Reserved for inherently not codable concepts without codable children: Secondary | ICD-10-CM

## 2016-12-16 DIAGNOSIS — R739 Hyperglycemia, unspecified: Secondary | ICD-10-CM | POA: Diagnosis not present

## 2016-12-16 DIAGNOSIS — Z01419 Encounter for gynecological examination (general) (routine) without abnormal findings: Secondary | ICD-10-CM | POA: Insufficient documentation

## 2016-12-16 DIAGNOSIS — Z1151 Encounter for screening for human papillomavirus (HPV): Secondary | ICD-10-CM | POA: Insufficient documentation

## 2016-12-16 DIAGNOSIS — K219 Gastro-esophageal reflux disease without esophagitis: Secondary | ICD-10-CM

## 2016-12-16 DIAGNOSIS — Z6841 Body Mass Index (BMI) 40.0 and over, adult: Secondary | ICD-10-CM

## 2016-12-16 DIAGNOSIS — Z124 Encounter for screening for malignant neoplasm of cervix: Secondary | ICD-10-CM | POA: Diagnosis not present

## 2016-12-16 DIAGNOSIS — R7402 Elevation of levels of lactic acid dehydrogenase (LDH): Secondary | ICD-10-CM

## 2016-12-16 LAB — CBC WITH DIFFERENTIAL/PLATELET
Basophils Absolute: 0.1 10*3/uL (ref 0.0–0.1)
Basophils Relative: 0.8 % (ref 0.0–3.0)
Eosinophils Absolute: 0.2 10*3/uL (ref 0.0–0.7)
Eosinophils Relative: 3 % (ref 0.0–5.0)
HCT: 43.2 % (ref 36.0–46.0)
Hemoglobin: 15 g/dL (ref 12.0–15.0)
Lymphocytes Relative: 35.9 % (ref 12.0–46.0)
Lymphs Abs: 2.5 10*3/uL (ref 0.7–4.0)
MCHC: 34.8 g/dL (ref 30.0–36.0)
MCV: 86.5 fl (ref 78.0–100.0)
MONO ABS: 0.6 10*3/uL (ref 0.1–1.0)
MONOS PCT: 8 % (ref 3.0–12.0)
NEUTROS ABS: 3.7 10*3/uL (ref 1.4–7.7)
Neutrophils Relative %: 52.3 % (ref 43.0–77.0)
PLATELETS: 145 10*3/uL — AB (ref 150.0–400.0)
RBC: 5 Mil/uL (ref 3.87–5.11)
RDW: 14.3 % (ref 11.5–15.5)
WBC: 7 10*3/uL (ref 4.0–10.5)

## 2016-12-16 LAB — TSH: TSH: 2.71 u[IU]/mL (ref 0.35–4.50)

## 2016-12-16 LAB — LIPID PANEL
CHOL/HDL RATIO: 3
Cholesterol: 177 mg/dL (ref 0–200)
HDL: 65.8 mg/dL (ref >39.00)
LDL CALC: 86 mg/dL (ref 0–99)
NonHDL: 111.64
TRIGLYCERIDES: 129 mg/dL (ref 0.0–149.0)
VLDL: 25.8 mg/dL (ref 0.0–40.0)

## 2016-12-16 LAB — HEPATIC FUNCTION PANEL
ALT: 51 U/L — ABNORMAL HIGH (ref 0–35)
AST: 40 U/L — ABNORMAL HIGH (ref 0–37)
Albumin: 3.8 g/dL (ref 3.5–5.2)
Alkaline Phosphatase: 96 U/L (ref 39–117)
BILIRUBIN TOTAL: 0.8 mg/dL (ref 0.2–1.2)
Bilirubin, Direct: 0.2 mg/dL (ref 0.0–0.3)
Total Protein: 6.6 g/dL (ref 6.0–8.3)

## 2016-12-16 LAB — BASIC METABOLIC PANEL
BUN: 10 mg/dL (ref 6–23)
CHLORIDE: 106 meq/L (ref 96–112)
CO2: 25 meq/L (ref 19–32)
Calcium: 9 mg/dL (ref 8.4–10.5)
Creatinine, Ser: 0.71 mg/dL (ref 0.40–1.20)
GFR: 88.39 mL/min (ref >60.00)
Glucose, Bld: 129 mg/dL — ABNORMAL HIGH (ref 70–99)
POTASSIUM: 3.9 meq/L (ref 3.5–5.1)
SODIUM: 140 meq/L (ref 135–145)

## 2016-12-16 LAB — HEMOGLOBIN A1C: Hgb A1c MFr Bld: 6.7 % — ABNORMAL HIGH (ref 4.6–6.5)

## 2016-12-16 LAB — T4, FREE: FREE T4: 1.01 ng/dL (ref 0.60–1.60)

## 2016-12-16 MED ORDER — LEVOTHYROXINE SODIUM 100 MCG PO TABS: 100.0000 ug | ORAL_TABLET | Freq: Every day | ORAL | 1 refills | Status: DC

## 2016-12-16 NOTE — Progress Notes (Signed)
Pre visit review using our clinic review tool, if applicable. No additional management support is needed unless otherwise documented below in the visit note. 

## 2016-12-16 NOTE — Patient Instructions (Addendum)
Send synthroid prescription after lab results review.  Exercising to Lose Weight Introduction Exercising can help you to lose weight. In order to lose weight through exercise, you need to do vigorous-intensity exercise. You can tell that you are exercising with vigorous intensity if you are breathing very hard and fast and cannot hold a conversation while exercising. Moderate-intensity exercise helps to maintain your current weight. You can tell that you are exercising at a moderate level if you have a higher heart rate and faster breathing, but you are still able to hold a conversation. How often should I exercise? Choose an activity that you enjoy and set realistic goals. Your health care provider can help you to make an activity plan that works for you. Exercise regularly as directed by your health care provider. This may include:  Doing resistance training twice each week, such as:  Push-ups.  Sit-ups.  Lifting weights.  Using resistance bands.  Doing a given intensity of exercise for a given amount of time. Choose from these options:  150 minutes of moderate-intensity exercise every week.  75 minutes of vigorous-intensity exercise every week.  A mix of moderate-intensity and vigorous-intensity exercise every week. Children, pregnant women, people who are out of shape, people who are overweight, and older adults may need to consult a health care provider for individual recommendations. If you have any sort of medical condition, be sure to consult your health care provider before starting a new exercise program. What are some activities that can help me to lose weight?  Walking at a rate of at least 4.5 miles an hour.  Jogging or running at a rate of 5 miles per hour.  Biking at a rate of at least 10 miles per hour.  Lap swimming.  Roller-skating or in-line skating.  Cross-country skiing.  Vigorous competitive sports, such as football, basketball, and soccer.  Jumping  rope.  Aerobic dancing. How can I be more active in my day-to-day activities?  Use the stairs instead of the elevator.  Take a walk during your lunch break.  If you drive, park your car farther away from work or school.  If you take public transportation, get off one stop early and walk the rest of the way.  Make all of your phone calls while standing up and walking around.  Get up, stretch, and walk around every 30 minutes throughout the day. What guidelines should I follow while exercising?  Do not exercise so much that you hurt yourself, feel dizzy, or get very short of breath.  Consult your health care provider prior to starting a new exercise program.  Wear comfortable clothes and shoes with good support.  Drink plenty of water while you exercise to prevent dehydration or heat stroke. Body water is lost during exercise and must be replaced.  Work out until you breathe faster and your heart beats faster. This information is not intended to replace advice given to you by your health care provider. Make sure you discuss any questions you have with your health care provider. Document Released: 12/28/2010 Document Revised: 05/02/2016 Document Reviewed: 04/28/2014  2017 [employer]vier

## 2016-12-16 NOTE — Assessment & Plan Note (Addendum)
Persistent elevated Hgb A1c of 6.7. Encourage weight loss through diet and regular exercise.

## 2016-12-16 NOTE — Assessment & Plan Note (Addendum)
Persistent elevated AST and ALT for last 5years. CT ABD/Pelvis 2012: no liver abnormality. ABD [place] 2012: no liver abnormality.  Patient declined hepatitis C screening stating it was done 62yrs ago and was negative. Per patient level were associated to excessive use of acetaminophen.

## 2016-12-16 NOTE — Assessment & Plan Note (Signed)
Wt Readings from Last 3 Encounters:  12/16/16 270 lb (122.5 kg)  06/19/15 255 lb (115.7 kg)  05/24/15 261 lb 11.2 oz (118.7 kg)   Needs aggressive weight loss due to hyperglycemia

## 2016-12-16 NOTE — Assessment & Plan Note (Signed)
Stable thyroid function. Maintain current dose of synthroid 159mcg.

## 2016-12-16 NOTE — Assessment & Plan Note (Signed)
Stable symptoms. Continue omeprazole 20mg .

## 2016-12-16 NOTE — Progress Notes (Signed)
Subjective:    Patient ID: Shelly [name], female    DOB: 23-Dec-1955, 61 y.o.   MRN: [id number]  Patient presents today for complete physical or establish care (new patient) and medication refill.  HPI   Denies any acute complains.  Immunizations: (TDAP, Hep C screen, Pneumovax, Influenza, zoster)  Health Maintenance  Topic Date Due  . Pap Smear  05/10/2015  . Mammogram  10/15/2015  .  Hepatitis C: One time screening is recommended by Center for Disease Control  (CDC) for  adults born from [birth year] through 1967.   12/15/2017*  . HIV Screening  12/15/2017*  . Tetanus Vaccine  09/20/2024  . Colon Cancer Screening  06/18/2025  . Flu Shot  Completed  . Shingles Vaccine  Completed  *Topic was postponed. The date shown is not the original due date.   Diet:regular Weight:  Wt Readings from Last 3 Encounters:  12/16/16 270 lb (122.5 kg)  06/19/15 255 lb (115.7 kg)  05/24/15 261 lb 11.2 oz (118.7 kg)   Exercise:none Fall Risk:denies No flowsheet data found. Home Safety:home with husband Depression/Suicide:denies No flowsheet data found. No flowsheet data found. Colonoscopy (every 5-41yrs, >50-36yrs):up to date Pap Smear (every 58yrs for >21-29 without HPV, every 54yrs for >30-28yrs with HPV):needed Mammogram (yearly, >37yrs):needed Vision:up to date Dental:uo to date Advanced Directive: Advanced Directives 06/19/2015  Does Patient Have a Medical Advance Directive? No  Would patient like information on creating a medical advance directive? No - patient declined information   Sexual History (birth control, marital status, STD):sexually active, married, s/p hysterectomy  Medications and allergies reviewed with patient and updated if appropriate.  Patient Active Problem List   Diagnosis Date Noted  . Obese   . GERD (gastroesophageal reflux disease) 06/02/2014  . Hyperglycemia 06/18/2013  . Hypothyroidism 01/07/2011  . IRRITABLE BOWEL SYNDROME 01/07/2011  . TRANSAMINASES,  SERUM, ELEVATED 01/07/2011    Current Outpatient Prescriptions on File Prior to Visit  Medication Sig Dispense Refill  . hyoscyamine (LEVSIN SL) 0.125 MG SL tablet PLACE 1 TABLET UNDER THE TONGUE AS NEEDED. (Patient taking differently: PLACE 1 TABLET UNDER THE TONGUE AS NEEDED ABDOMINAL PAIN) 90 tablet 0  . omeprazole (PRILOSEC) 40 MG capsule TAKE 1 TABLET DAILY. 30 capsule 3  . hydrocortisone (ANUSOL-HC) 2.5 % rectal cream Place 1 application rectally 2 (two) times daily. (Patient not taking: Reported on 12/16/2016) 30 g 1  . loratadine (CLARITIN) 10 MG tablet Take 10 mg by mouth daily.    Marland Kitchen omeprazole (PRILOSEC) 40 MG capsule TAKE 1 CAPSULE BY MOUTH ONCE A DAY. (Patient not taking: Reported on 12/16/2016) 30 capsule 5   No current facility-administered medications on file prior to visit.     Past Medical History:  Diagnosis Date  . GERD (gastroesophageal reflux disease)   . Headache(784.0)   . Hypothyroidism   . Irritable bowel syndrome (IBS)     Past Surgical History:  Procedure Laterality Date  . ABDOMINAL HYSTERECTOMY    . COLONOSCOPY N/A 06/19/2015   Procedure: COLONOSCOPY;  Surgeon: Rogene Houston, MD;  Location: [facility];  Service: Endoscopy;  Laterality: N/A;  8:30-9:30  . cyst removed     right arm  . FLEXIBLE SIGMOIDOSCOPY  07/23/2011   Procedure: FLEXIBLE SIGMOIDOSCOPY;  Surgeon: Rogene Houston, MD;  Location: [facility];  Service: Endoscopy;  Laterality: N/A;    Social History   Social History  . Marital status: Married    Spouse name: N/A  . Number of children: N/A  .  Years of education: N/A   Social History Main Topics  . Smoking status: Never Smoker  . Smokeless tobacco: Never Used  . Alcohol use Yes     Comment: social  . Drug use: No  . Sexual activity: Yes    Birth control/ protection: Surgical   Other Topics Concern  . None   Social History Narrative  . None    Family History  Problem Relation Age of Onset  . Diabetes Mother   .  Breast cancer Mother 3  . Atrial fibrillation Mother 21    s/p ablation  . Breast cancer Maternal Grandmother 80  . Colon cancer Maternal Aunt 80  . Colon cancer Maternal Uncle 79        Review of Systems  Constitutional: Negative for fever, malaise/fatigue and weight loss.  HENT: Negative for congestion and sore throat.   Eyes:       Negative for visual changes  Respiratory: Negative for cough and shortness of breath.   Cardiovascular: Negative for chest pain, palpitations and leg swelling.  Gastrointestinal: Negative for blood in stool, constipation, diarrhea and heartburn.  Genitourinary: Negative for dysuria, frequency and urgency.  Musculoskeletal: Negative for falls, joint pain and myalgias.  Skin: Negative for rash.  Neurological: Negative for dizziness, sensory change and headaches.  Endo/Heme/Allergies: Does not bruise/bleed easily.  Psychiatric/Behavioral: Negative for depression, substance abuse and suicidal ideas. The patient is not nervous/anxious.     Objective:   Vitals:   12/16/16 1108  BP: 132/68  Pulse: 64  Resp: 16  Temp: 98.7 F (37.1 C)    Body mass index is 41.05 kg/m.   Physical Examination:  Physical Exam  Constitutional: She is oriented to person, place, and time and well-developed, well-nourished, and in no distress. No distress.  HENT:  Right Ear: External ear normal.  Left Ear: External ear normal.  Nose: Nose normal.  Mouth/Throat: No oropharyngeal exudate.  Eyes: Conjunctivae and EOM are normal. Pupils are equal, round, and reactive to light. No scleral icterus.  Neck: Normal range of motion. Neck supple. No thyromegaly present.  Cardiovascular: Normal rate, regular rhythm, normal heart sounds and intact distal pulses.   Pulmonary/Chest: Effort normal and breath sounds normal. Right breast exhibits no inverted nipple, no mass, no nipple discharge, no skin change and no tenderness. Left breast exhibits no inverted nipple, no mass, no  nipple discharge, no skin change and no tenderness. Breasts are symmetrical.  Abdominal: Soft. Bowel sounds are normal. She exhibits no distension. There is no tenderness.  Genitourinary: Rectum normal, vagina normal, cervix normal, right adnexa normal, left adnexa normal and vulva normal. Cervix exhibits no motion tenderness. No vaginal discharge found.  Genitourinary Comments: S/p hystrectomy  Musculoskeletal: Normal range of motion. She exhibits no edema or tenderness.  Lymphadenopathy:    She has no cervical adenopathy.  Neurological: She is alert and oriented to person, place, and time. Gait normal.  Skin: Skin is warm and dry.  Psychiatric: Affect and judgment normal.  Vitals reviewed.   ASSESSMENT and PLAN:  Bendetta was seen today for annual exam.  Diagnoses and all orders for this visit:  Encounter for preventative adult health care exam with abnormal findings -     MM DIGITAL SCREENING BILATERAL; Future -     Cancel: Cytology - PAP -     CBC w/Diff; Future -     Hepatic function panel; Future -     Basic Metabolic Panel (BMET); Future -     Lipid  panel; Future -     Cytology - PAP; Future  Hyperglycemia -     Hemoglobin A1c; Future  Hypothyroidism, unspecified type -     TSH; Future -     T4, free; Future -     levothyroxine (SYNTHROID, LEVOTHROID) 100 MCG tablet; Take 1 tablet (100 mcg total) by mouth daily before breakfast.  TRANSAMINASES, SERUM, ELEVATED -     Hepatic function panel; Future  Encounter for screening mammogram for breast cancer -     MM DIGITAL SCREENING BILATERAL; Future  Encounter for Papanicolaou smear for cervical cancer screening -     Cancel: Cytology - PAP -     Cytology - PAP; Future  Gastroesophageal reflux disease without esophagitis  Class 3 obesity with body mass index (BMI) of 40.0 to 44.9 in adult, unspecified obesity type, unspecified whether serious comorbidity present (HCC)    TRANSAMINASES, SERUM, ELEVATED Persistent  elevated AST and ALT for last 5years. CT ABD/Pelvis 2012: no liver abnormality. ABD [place] 2012: no liver abnormality.  Patient declined hepatitis C screening stating it was done 98yrs ago and was negative. Per patient level were associated to excessive use of acetaminophen.  Hyperglycemia Persistent elevated Hgb A1c of 6.7. Encourage weight loss through diet and regular exercise.   Hypothyroidism Stable thyroid function. Maintain current dose of synthroid 178mcg.  GERD (gastroesophageal reflux disease) Stable symptoms. Continue omeprazole 20mg .  Obese Wt Readings from Last 3 Encounters:  12/16/16 270 lb (122.5 kg)  06/19/15 255 lb (115.7 kg)  05/24/15 261 lb 11.2 oz (118.7 kg)   Needs aggressive weight loss due to hyperglycemia    Recent Results (from the past 2160 hour(s))  TSH     Status: None   Collection Time: 12/16/16 12:10 PM  Result Value Ref Range   TSH 2.71 0.35 - 4.50 uIU/mL  T4, free     Status: None   Collection Time: 12/16/16 12:10 PM  Result Value Ref Range   Free T4 1.01 0.60 - 1.60 ng/dL    Comment: Specimens from patients who are undergoing biotin therapy and /or ingesting biotin supplements may contain high levels of biotin.  The higher biotin concentration in these specimens interferes with this Free T4 assay.  Specimens that contain high levels  of biotin may cause false high results for this Free T4 assay.  Please interpret results in light of the total clinical presentation of the patient.    CBC w/Diff     Status: Abnormal   Collection Time: 12/16/16 12:10 PM  Result Value Ref Range   WBC 7.0 4.0 - 10.5 K/uL   RBC 5.00 3.87 - 5.11 Mil/uL   Hemoglobin 15.0 12.0 - 15.0 g/dL   HCT 43.2 36.0 - 46.0 %   MCV 86.5 78.0 - 100.0 fl   MCHC 34.8 30.0 - 36.0 g/dL   RDW 14.3 11.5 - 15.5 %   Platelets 145.0 (L) 150.0 - 400.0 K/uL   Neutrophils Relative % 52.3 43.0 - 77.0 %   Lymphocytes Relative 35.9 12.0 - 46.0 %   Monocytes Relative 8.0 3.0 - 12.0 %    Eosinophils Relative 3.0 0.0 - 5.0 %   Basophils Relative 0.8 0.0 - 3.0 %   Neutro Abs 3.7 1.4 - 7.7 K/uL   Lymphs Abs 2.5 0.7 - 4.0 K/uL   Monocytes Absolute 0.6 0.1 - 1.0 K/uL   Eosinophils Absolute 0.2 0.0 - 0.7 K/uL   Basophils Absolute 0.1 0.0 - 0.1 K/uL  Hepatic function panel  Status: Abnormal   Collection Time: 12/16/16 12:10 PM  Result Value Ref Range   Total Bilirubin 0.8 0.2 - 1.2 mg/dL   Bilirubin, Direct 0.2 0.0 - 0.3 mg/dL   Alkaline Phosphatase 96 39 - 117 U/L   AST 40 (H) 0 - 37 U/L   ALT 51 (H) 0 - 35 U/L   Total Protein 6.6 6.0 - 8.3 g/dL   Albumin 3.8 3.5 - 5.2 g/dL  Basic Metabolic Panel (BMET)     Status: Abnormal   Collection Time: 12/16/16 12:10 PM  Result Value Ref Range   Sodium 140 135 - 145 mEq/L   Potassium 3.9 3.5 - 5.1 mEq/L   Chloride 106 96 - 112 mEq/L   CO2 25 19 - 32 mEq/L   Glucose, Bld 129 (H) 70 - 99 mg/dL   BUN 10 6 - 23 mg/dL   Creatinine, Ser 0.71 0.40 - 1.20 mg/dL   Calcium 9.0 8.4 - 10.5 mg/dL   GFR 88.39 >60.00 mL/min  Lipid panel     Status: None   Collection Time: 12/16/16 12:10 PM  Result Value Ref Range   Cholesterol 177 0 - 200 mg/dL    Comment: ATP III Classification       Desirable:  < 200 mg/dL               Borderline High:  200 - 239 mg/dL          High:  > = 240 mg/dL   Triglycerides 129.0 0.0 - 149.0 mg/dL    Comment: Normal:  <150 mg/dLBorderline High:  150 - 199 mg/dL   HDL 65.80 >39.00 mg/dL   VLDL 25.8 0.0 - 40.0 mg/dL   LDL Cholesterol 86 0 - 99 mg/dL   Total CHOL/HDL Ratio 3     Comment:                Men          Women1/2 Average Risk     3.4          3.3Average Risk          5.0          4.42X Average Risk          9.6          7.13X Average Risk          15.0          11.0                       NonHDL 111.64     Comment: NOTE:  Non-HDL goal should be 30 mg/dL higher than patient's LDL goal (i.e. LDL goal of < 70 mg/dL, would have non-HDL goal of < 100 mg/dL)  Hemoglobin A1c     Status: Abnormal    Collection Time: 12/16/16 12:10 PM  Result Value Ref Range   Hgb A1c MFr Bld 6.7 (H) 4.6 - 6.5 %    Comment: Glycemic Control Guidelines for People with Diabetes:Non Diabetic:  <6%Goal of Therapy: <7%Additional Action Suggested:  >8%    Follow up: Return in about 6 months (around 06/15/2017) for  hyperglycemia (repeat Hgb A1c POCT).Wilfred Lacy, NP

## 2016-12-18 LAB — CYTOLOGY - PAP
Diagnosis: NEGATIVE
HPV (WINDOPATH): NOT DETECTED

## 2016-12-18 NOTE — Progress Notes (Signed)
Normal results

## 2017-01-22 ENCOUNTER — Ambulatory Visit: Payer: BC Managed Care – PPO

## 2017-01-22 ENCOUNTER — Ambulatory Visit
Admission: RE | Admit: 2017-01-22 | Discharge: 2017-01-22 | Disposition: A | Discharge status: Home / Self Care | Payer: BC Managed Care – PPO | Source: Ambulatory Visit | Attending: Nurse Practitioner | Admitting: Nurse Practitioner

## 2017-01-22 DIAGNOSIS — Z0001 Encounter for general adult medical examination with abnormal findings: Secondary | ICD-10-CM

## 2017-01-22 DIAGNOSIS — Z1231 Encounter for screening mammogram for malignant neoplasm of breast: Secondary | ICD-10-CM

## 2017-02-08 ENCOUNTER — Other Ambulatory Visit (INDEPENDENT_AMBULATORY_CARE_PROVIDER_SITE_OTHER): Payer: Self-pay | Admitting: Internal Medicine

## 2017-02-10 NOTE — Telephone Encounter (Signed)
Patient will need to have an appointment prior to further refills. It appears the patient was last seen 02/22/2015.

## 2017-02-11 ENCOUNTER — Encounter (INDEPENDENT_AMBULATORY_CARE_PROVIDER_SITE_OTHER): Payer: Self-pay | Admitting: Internal Medicine

## 2017-02-11 NOTE — Telephone Encounter (Signed)
An appointment was given for 03/24/17 at 10:30am with Deberah Castle, NP.  A letter was mailed to the patient.

## 2017-03-24 ENCOUNTER — Encounter (INDEPENDENT_AMBULATORY_CARE_PROVIDER_SITE_OTHER): Payer: Self-pay

## 2017-03-24 ENCOUNTER — Encounter (INDEPENDENT_AMBULATORY_CARE_PROVIDER_SITE_OTHER): Payer: Self-pay | Admitting: Internal Medicine

## 2017-03-24 ENCOUNTER — Ambulatory Visit (INDEPENDENT_AMBULATORY_CARE_PROVIDER_SITE_OTHER): Payer: BC Managed Care – PPO | Admitting: Internal Medicine

## 2017-03-24 VITALS — BP 132/86 | HR 64 | Temp 98.1°F | Ht 68.0 in | Wt 268.7 lb

## 2017-03-24 DIAGNOSIS — K219 Gastro-esophageal reflux disease without esophagitis: Secondary | ICD-10-CM | POA: Diagnosis not present

## 2017-03-24 DIAGNOSIS — K580 Irritable bowel syndrome with diarrhea: Secondary | ICD-10-CM | POA: Diagnosis not present

## 2017-03-24 MED ORDER — HYOSCYAMINE SULFATE 0.125 MG SL SUBL
0.1250 mg | SUBLINGUAL_TABLET | Freq: Every day | SUBLINGUAL | 3 refills | Status: DC
Start: 2017-03-24 — End: 2018-04-16

## 2017-03-24 MED ORDER — OMEPRAZOLE 40 MG PO CPDR
40.0000 mg | DELAYED_RELEASE_CAPSULE | Freq: Every day | ORAL | 3 refills | Status: DC
Start: 2017-03-24 — End: 2018-04-16

## 2017-03-24 NOTE — Progress Notes (Signed)
   Subjective:    Patient ID: Shelly [name], female    DOB: 11/07/1956, 61 y.o.   MRN: [id number]  HPI Here today for f/u. She tells me she is doing good. Acid reflux for the most part controlled with Omeprazole. Her appetite is good. No weight loss. She has a BM just about every day. 06/24/2015 Colonoscopy: screening.  Examination performed to cecum. 4 mm polyp ablated via cold biopsy from occipital transverse colon. Small external hemorrhoids and single anal papilla.  Polyp is tubular adenoma. Next TCS in 7 years. Report to PCP   Review of Systems Past Medical History:  Diagnosis Date  . GERD (gastroesophageal reflux disease)   . Headache(784.0)   . Hypothyroidism   . Irritable bowel syndrome (IBS)     Past Surgical History:  Procedure Laterality Date  . ABDOMINAL HYSTERECTOMY    . COLONOSCOPY N/A 06/19/2015   Procedure: COLONOSCOPY;  Surgeon: Rogene Houston, MD;  Location: [facility];  Service: Endoscopy;  Laterality: N/A;  8:30-9:30  . cyst removed     right arm  . FLEXIBLE SIGMOIDOSCOPY  07/23/2011   Procedure: FLEXIBLE SIGMOIDOSCOPY;  Surgeon: Rogene Houston, MD;  Location: [facility];  Service: Endoscopy;  Laterality: N/A;    Allergies  Allergen Reactions  . Propoxyphene N-Acetaminophen Hives and Rash    Current Outpatient Prescriptions on File Prior to Visit  Medication Sig Dispense Refill  . fexofenadine (ALLEGRA) 180 MG tablet Take 180 mg by mouth daily.    . hyoscyamine (LEVSIN SL) 0.125 MG SL tablet PLACE 1 TABLET UNDER THE TONGUE AS NEEDED. (Patient taking differently: PLACE 1 TABLET UNDER THE TONGUE AS NEEDED ABDOMINAL PAIN) 90 tablet 0  . levothyroxine (SYNTHROID, LEVOTHROID) 100 MCG tablet Take 1 tablet (100 mcg total) by mouth daily before breakfast. 90 tablet 1  . omeprazole (PRILOSEC) 40 MG capsule TAKE 1 TABLET DAILY. 30 capsule 3   No current facility-administered medications on file prior to visit.        Objective:   Physical Exam   Blood pressure 132/86, pulse 64, temperature 98.1 F (36.7 C), height 5\' 8"  (1.727 m), weight 268 lb 11.2 oz (121.9 kg).  Alert and oriented. Skin warm and dry. Oral mucosa is moist.   . Sclera anicteric, conjunctivae is pink. Thyroid not enlarged. No cervical lymphadenopathy. Lungs clear. Heart regular rate and rhythm.  Abdomen is soft. Bowel sounds are positive. No hepatomegaly. No abdominal masses felt. No tenderness.  No edema to lower extremities.          Assessment & Plan:  IBS. Continue the Levson. GERD: Continue the Omeprazole. OV in 1 year.

## 2017-03-24 NOTE — Patient Instructions (Signed)
OV in 1 year.  

## 2017-12-23 ENCOUNTER — Ambulatory Visit: Payer: BC Managed Care – PPO | Admitting: Physician Assistant

## 2017-12-23 ENCOUNTER — Ambulatory Visit: Payer: BC Managed Care – PPO | Admitting: Family Medicine

## 2017-12-23 ENCOUNTER — Encounter: Payer: Self-pay | Admitting: Family Medicine

## 2017-12-23 VITALS — BP 148/90 | HR 74 | Temp 97.9°F | Ht 68.0 in | Wt 270.5 lb

## 2017-12-23 DIAGNOSIS — R635 Abnormal weight gain: Secondary | ICD-10-CM

## 2017-12-23 DIAGNOSIS — R350 Frequency of micturition: Secondary | ICD-10-CM

## 2017-12-23 DIAGNOSIS — R300 Dysuria: Secondary | ICD-10-CM

## 2017-12-23 DIAGNOSIS — E039 Hypothyroidism, unspecified: Secondary | ICD-10-CM

## 2017-12-23 DIAGNOSIS — R739 Hyperglycemia, unspecified: Secondary | ICD-10-CM

## 2017-12-23 LAB — COMPREHENSIVE METABOLIC PANEL
ALBUMIN: 3.7 g/dL (ref 3.5–5.2)
ALK PHOS: 100 U/L (ref 39–117)
ALT: 49 U/L — ABNORMAL HIGH (ref 0–35)
AST: 49 U/L — ABNORMAL HIGH (ref 0–37)
BUN: 10 mg/dL (ref 6–23)
CO2: 24 mEq/L (ref 19–32)
Calcium: 8.5 mg/dL (ref 8.4–10.5)
Chloride: 106 mEq/L (ref 96–112)
Creatinine, Ser: 0.75 mg/dL (ref 0.40–1.20)
GFR: 82.7 mL/min (ref >60.00)
Glucose, Bld: 185 mg/dL — ABNORMAL HIGH (ref 70–99)
POTASSIUM: 3.7 meq/L (ref 3.5–5.1)
SODIUM: 138 meq/L (ref 135–145)
TOTAL PROTEIN: 6.4 g/dL (ref 6.0–8.3)
Total Bilirubin: 0.7 mg/dL (ref 0.2–1.2)

## 2017-12-23 LAB — CBC WITH DIFFERENTIAL/PLATELET
Basophils Absolute: 0.1 10*3/uL (ref 0.0–0.1)
Basophils Relative: 1.3 % (ref 0.0–3.0)
EOS ABS: 0.2 10*3/uL (ref 0.0–0.7)
EOS PCT: 3.5 % (ref 0.0–5.0)
HEMATOCRIT: 43 % (ref 36.0–46.0)
HEMOGLOBIN: 14.1 g/dL (ref 12.0–15.0)
LYMPHS PCT: 40.4 % (ref 12.0–46.0)
Lymphs Abs: 2.4 10*3/uL (ref 0.7–4.0)
MCHC: 32.8 g/dL (ref 30.0–36.0)
MCV: 87.7 fl (ref 78.0–100.0)
MONO ABS: 0.5 10*3/uL (ref 0.1–1.0)
Monocytes Relative: 9 % (ref 3.0–12.0)
Neutro Abs: 2.7 10*3/uL (ref 1.4–7.7)
Neutrophils Relative %: 45.8 % (ref 43.0–77.0)
Platelets: 135 10*3/uL — ABNORMAL LOW (ref 150.0–400.0)
RBC: 4.91 Mil/uL (ref 3.87–5.11)
RDW: 15.3 % (ref 11.5–15.5)
WBC: 6 10*3/uL (ref 4.0–10.5)

## 2017-12-23 LAB — POCT URINALYSIS DIPSTICK
BILIRUBIN UA: NEGATIVE
Blood, UA: NEGATIVE
GLUCOSE UA: NEGATIVE
KETONES UA: NEGATIVE
Leukocytes, UA: NEGATIVE
Nitrite, UA: NEGATIVE
Protein, UA: NEGATIVE
Spec Grav, UA: >=1.03 — AB (ref 1.010–1.025)
UROBILINOGEN UA: 0.2 U/dL
pH, UA: 5.5 (ref 5.0–8.0)

## 2017-12-23 LAB — POCT GLUCOSE (DEVICE FOR HOME USE): POC Glucose: 193 mg/dl — AB (ref 70–99)

## 2017-12-23 LAB — LIPID PANEL
CHOLESTEROL: 132 mg/dL (ref 0–200)
HDL: 61.3 mg/dL (ref >39.00)
LDL CALC: 38 mg/dL (ref 0–99)
NonHDL: 70.79
TRIGLYCERIDES: 162 mg/dL — AB (ref 0.0–149.0)
Total CHOL/HDL Ratio: 2
VLDL: 32.4 mg/dL (ref 0.0–40.0)

## 2017-12-23 LAB — TSH: TSH: 2.04 u[IU]/mL (ref 0.35–4.50)

## 2017-12-23 LAB — HEMOGLOBIN A1C: Hgb A1c MFr Bld: 7.4 % — ABNORMAL HIGH (ref 4.6–6.5)

## 2017-12-23 MED ORDER — FLUCONAZOLE 150 MG PO TABS: ORAL_TABLET | ORAL | 0 refills | Status: DC

## 2017-12-23 NOTE — Progress Notes (Signed)
Subjective   CC:  Chief Complaint  Patient presents with  . Urinary Frequency    x 1.5 weeks  . Dysuria    at end of stream    HPI: Shelly [name] is a 62 y.o. female who presents to the office today to address the problems listed above in the chief complaint. She would like to transfer her care here.  Patient reports dysuria and urinary frequency. She reports intermittent sxs over the last 1-2 weeks, and pain is only at the end of the urination. She c/o dark urine. Endorses polydypsia. By chart review, a1c was 6.7 12/2016; hasn't had any f/u since then.  She denies fevers flank pain nausea vomiting or gross hematuria.  Symptoms have been present for several hours to days.  She denies history of interstitial cystitis.  She admits to vaginal symptoms including vaginal redness with mild soreness w/o discharge, she is monogamous.   I reviewed the patients updated PMH, FH, and SocHx.    Patient Active Problem List   Diagnosis Date Noted  . Obese   . GERD (gastroesophageal reflux disease) 06/02/2014  . Hyperglycemia 06/18/2013  . Hypothyroidism 01/07/2011  . IRRITABLE BOWEL SYNDROME 01/07/2011  . TRANSAMINASES, SERUM, ELEVATED 01/07/2011   No outpatient medications have been marked as taking for the 12/23/17 encounter (Office Visit) with Leamon Arnt, MD.    Review of Systems: Cardiovascular: negative for chest pain Respiratory: negative for SOB or persistent cough Gastrointestinal: negative for abdominal pain Constitutional: Negative for fever malaise or anorexia  Objective  Vitals: BP (!) 148/90 (BP Location: Left Arm, Patient Position: Sitting, Cuff Size: Large)   Pulse 74   Temp 97.9 F (36.6 C) (Oral)   Ht 5\' 8"  (1.727 m)   Wt 270 lb 8 oz (122.7 kg)   SpO2 95%   BMI 41.13 kg/m  General: no acute distress  Psych:  Alert and oriented, normal mood and affect Cardiovascular:  RRR without murmur or gallop. no peripheral edema Respiratory:  Good breath sounds  bilaterally, CTAB with normal respiratory effort Gastrointestinal: soft, flat abdomen, normal active bowel sounds, no palpable masses, no hepatosplenomegaly, no appreciated hernias, NO CVAT, No suprapubic ttp w/o rebound or guarding Skin:  Warm, no rashes Neurologic:   Mental status is normal. normal gait Office Visit on 12/23/2017  Component Date Value Ref Range Status  . Color, UA 12/23/2017 yellow   Final  . Clarity, UA 12/23/2017 clear   Final  . Glucose, UA 12/23/2017 negative   Final  . Bilirubin, UA 12/23/2017 negative   Final  . Ketones, UA 12/23/2017 negative   Final  . Spec Grav, UA 12/23/2017 >=1.030* 1.010 - 1.025 Final  . Blood, UA 12/23/2017 negative   Final  . pH, UA 12/23/2017 5.5  5.0 - 8.0 Final  . Protein, UA 12/23/2017 negative   Final  . Urobilinogen, UA 12/23/2017 0.2  0.2 or 1.0 E.U./dL Final  . Nitrite, UA 12/23/2017 negative   Final  . Leukocytes, UA 12/23/2017 Negative  Negative Final  . POC Glucose 12/23/2017 193* 70 - 99 mg/dl Final    Assessment  1. Frequency of urination   2. Dysuria   3. Hyperglycemia   4. Weight gain   5. Acquired hypothyroidism      Plan   Most likely urinary frequency due to worsening hyperglycemia; will check diabetes labs and lipids. Treat for yeast vaginitis as cause of dysuria. Counseling done. To return in 2-4 weeks for establish care visit and discuss  lab results. To stop desserts/sweets in the interim.   Follow up: 2-4 weeks    Commons side effects, risks, benefits, and alternatives for medications and treatment plan prescribed today were discussed, and the patient expressed understanding of the given instructions. Patient is instructed to call or message via MyChart if he/she has any questions or concerns regarding our treatment plan. No barriers to understanding were identified. We discussed Red Flag symptoms and signs in detail. Patient expressed understanding regarding what to do in case of urgent or emergency type  symptoms.   Medication list was reconciled, printed and provided to the patient in AVS. Patient instructions and summary information was reviewed with the patient as documented in the AVS. This note was prepared with assistance of Dragon voice recognition software. Occasional wrong-word or sound-a-like substitutions may have occurred due to the inherent limitations of voice recognition software  Orders Placed This Encounter  Procedures  . Hemoglobin A1c  . Comprehensive metabolic panel  . CBC with Differential/Platelet  . Lipid panel  . TSH  . POCT urinalysis dipstick   Meds ordered this encounter  Medications  . fluconazole (DIFLUCAN) 150 MG tablet    Sig: Take one tablet today; may repeat in 3 days if symptoms persist    Dispense:  2 tablet    Refill:  0

## 2017-12-23 NOTE — Patient Instructions (Addendum)
Please return in the next 2-4 weeks for a new patient appointment and to follow up on your labs.   It was a pleasure meeting you today! Thank you for choosing [place] to meet your healthcare needs! I truly look forward to working with you. If you have any questions or concerns, please send me a message via Mychart or call the office at [phone].   Diabetes Mellitus and Nutrition When you have diabetes (diabetes mellitus), it is very important to have healthy eating habits because your blood sugar (glucose) levels are greatly affected by what you eat and drink. Eating healthy foods in the appropriate amounts, at about the same times every day, can help you:  Control your blood glucose.  Lower your risk of heart disease.  Improve your blood pressure.  Reach or maintain a healthy weight.  Every person with diabetes is different, and each person has different needs for a meal plan. Your health care provider may recommend that you work with a diet and nutrition specialist (dietitian) to make a meal plan that is best for you. Your meal plan may vary depending on factors such as:  The calories you need.  The medicines you take.  Your weight.  Your blood glucose, blood pressure, and cholesterol levels.  Your activity level.  Other health conditions you have, such as heart or kidney disease.  How do carbohydrates affect me? Carbohydrates affect your blood glucose level more than any other type of food. Eating carbohydrates naturally increases the amount of glucose in your blood. Carbohydrate counting is a method for keeping track of how many carbohydrates you eat. Counting carbohydrates is important to keep your blood glucose at a healthy level, especially if you use insulin or take certain oral diabetes medicines. It is important to know how many carbohydrates you can safely have in each meal. This is different for every person. Your dietitian can help you calculate how many carbohydrates you  should have at each meal and for snack. Foods that contain carbohydrates include:  Bread, cereal, rice, pasta, and crackers.  Potatoes and corn.  Peas, beans, and lentils.  Milk and yogurt.  Fruit and juice.  Desserts, such as cakes, cookies, ice cream, and candy.  How does alcohol affect me? Alcohol can cause a sudden decrease in blood glucose (hypoglycemia), especially if you use insulin or take certain oral diabetes medicines. Hypoglycemia can be a life-threatening condition. Symptoms of hypoglycemia (sleepiness, dizziness, and confusion) are similar to symptoms of having too much alcohol. If your health care provider says that alcohol is safe for you, follow these guidelines:  Limit alcohol intake to no more than 1 drink per day for nonpregnant women and 2 drinks per day for men. One drink equals 12 oz of beer, 5 oz of wine, or 1 oz of hard liquor.  Do not drink on an empty stomach.  Keep yourself hydrated with water, diet soda, or unsweetened iced tea.  Keep in mind that regular soda, juice, and other mixers may contain a lot of sugar and must be counted as carbohydrates.  What are tips for following this plan? Reading food labels  Start by checking the serving size on the label. The amount of calories, carbohydrates, fats, and other nutrients listed on the label are based on one serving of the food. Many foods contain more than one serving per package.  Check the total grams (g) of carbohydrates in one serving. You can calculate the number of servings of carbohydrates in one  serving by dividing the total carbohydrates by 15. For example, if a food has 30 g of total carbohydrates, it would be equal to 2 servings of carbohydrates.  Check the number of grams (g) of saturated and trans fats in one serving. Choose foods that have low or no amount of these fats.  Check the number of milligrams (mg) of sodium in one serving. Most people should limit total sodium intake to less  than 2,300 mg per day.  Always check the nutrition information of foods labeled as "low-fat" or "nonfat". These foods may be higher in added sugar or refined carbohydrates and should be avoided.  Talk to your dietitian to identify your daily goals for nutrients listed on the label. Shopping  Avoid buying canned, premade, or processed foods. These foods tend to be high in fat, sodium, and added sugar.  Shop around the outside edge of the grocery store. This includes fresh fruits and vegetables, bulk grains, fresh meats, and fresh dairy. Cooking  Use low-heat cooking methods, such as baking, instead of high-heat cooking methods like deep frying.  Cook using healthy oils, such as olive, canola, or sunflower oil.  Avoid cooking with butter, cream, or high-fat meats. Meal planning  Eat meals and snacks regularly, preferably at the same times every day. Avoid going long periods of time without eating.  Eat foods high in fiber, such as fresh fruits, vegetables, beans, and whole grains. Talk to your dietitian about how many servings of carbohydrates you can eat at each meal.  Eat 4-6 ounces of lean protein each day, such as lean meat, chicken, fish, eggs, or tofu. 1 ounce is equal to 1 ounce of meat, chicken, or fish, 1 egg, or 1/4 cup of tofu.  Eat some foods each day that contain healthy fats, such as avocado, nuts, seeds, and fish. Lifestyle   Check your blood glucose regularly.  Exercise at least 30 minutes 5 or more days each week, or as told by your health care provider.  Take medicines as told by your health care provider.  Do not use any products that contain nicotine or tobacco, such as cigarettes and e-cigarettes. If you need help quitting, ask your health care provider.  Work with a Social worker or diabetes educator to identify strategies to manage stress and any emotional and social challenges. What are some questions to ask my health care provider?  Do I need to meet with a  diabetes educator?  Do I need to meet with a dietitian?  What number can I call if I have questions?  When are the best times to check my blood glucose? Where to find more information:  American Diabetes Association: diabetes.org/food-and-fitness/food  Academy of Nutrition and Dietetics: PokerClues.dk  [employer] of Diabetes and Digestive and Kidney Diseases (NIH): ContactWire.be Summary  A healthy meal plan will help you control your blood glucose and maintain a healthy lifestyle.  Working with a diet and nutrition specialist (dietitian) can help you make a meal plan that is best for you.  Keep in mind that carbohydrates and alcohol have immediate effects on your blood glucose levels. It is important to count carbohydrates and to use alcohol carefully. This information is not intended to replace advice given to you by your health care provider. Make sure you discuss any questions you have with your health care provider. Document Released: 08/22/2005 Document Revised: 12/30/2016 Document Reviewed: 12/30/2016 Elsevier Interactive Patient Education  [employer].

## 2017-12-28 NOTE — Progress Notes (Signed)
Please call patient: I have reviewed his/her lab results. She does have diabetes and this is likely the cause of her symptoms. Her liver tests are elevated but his is chronic. Other labs look stable and we can discuss things more at her establish care visit on 01/16/2018. For now, limit sugar intake.

## 2018-01-16 ENCOUNTER — Encounter: Payer: Self-pay | Admitting: Family Medicine

## 2018-01-16 ENCOUNTER — Other Ambulatory Visit: Payer: Self-pay

## 2018-01-16 ENCOUNTER — Ambulatory Visit: Payer: BC Managed Care – PPO | Admitting: Family Medicine

## 2018-01-16 VITALS — BP 138/80 | HR 77 | Temp 98.3°F | Ht 67.25 in | Wt 265.0 lb

## 2018-01-16 DIAGNOSIS — R030 Elevated blood-pressure reading, without diagnosis of hypertension: Secondary | ICD-10-CM | POA: Insufficient documentation

## 2018-01-16 DIAGNOSIS — Z803 Family history of malignant neoplasm of breast: Secondary | ICD-10-CM | POA: Insufficient documentation

## 2018-01-16 DIAGNOSIS — Z23 Encounter for immunization: Secondary | ICD-10-CM | POA: Diagnosis not present

## 2018-01-16 DIAGNOSIS — E119 Type 2 diabetes mellitus without complications: Secondary | ICD-10-CM | POA: Diagnosis not present

## 2018-01-16 DIAGNOSIS — K760 Fatty (change of) liver, not elsewhere classified: Secondary | ICD-10-CM

## 2018-01-16 DIAGNOSIS — E039 Hypothyroidism, unspecified: Secondary | ICD-10-CM | POA: Diagnosis not present

## 2018-01-16 DIAGNOSIS — Z800 Family history of malignant neoplasm of digestive organs: Secondary | ICD-10-CM | POA: Insufficient documentation

## 2018-01-16 LAB — MICROALBUMIN / CREATININE URINE RATIO
Creatinine,U: 145.7 mg/dL
MICROALB/CREAT RATIO: 0.5 mg/g (ref 0.0–30.0)

## 2018-01-16 LAB — GLUCOSE, POCT (MANUAL RESULT ENTRY): POC GLUCOSE: 135 mg/dL — AB (ref 70–99)

## 2018-01-16 MED ORDER — LEVOTHYROXINE SODIUM 100 MCG PO TABS: 100.0000 ug | ORAL_TABLET | Freq: Every day | ORAL | 3 refills | Status: DC

## 2018-01-16 NOTE — Addendum Note (Signed)
Addended bySigurd Sos on: 01/16/2018 10:14 AM   Modules accepted: Orders

## 2018-01-16 NOTE — Addendum Note (Signed)
Addended bySigurd Sos on: 01/16/2018 09:46 AM   Modules accepted: Orders

## 2018-01-16 NOTE — Progress Notes (Signed)
Subjective  CC:  Chief Complaint  Patient presents with  . Establish Care    Transferred from Shelba Flake     HPI: Shelly [name] is a 62 y.o. female who presents to the office today for follow up of diabetes and problems listed above in the chief complaint.   Diabetes follow up: New dx of diabetes last month after c/o of urinary frequency.  Her diabetic control is reported as Improved. She has lost 6 pounds and is eating a healthy diabetic diet. She declines diabetic education now but has a family member who is a nutritionist who is helping her.  She denies exertional CP or SOB or symptomatic hypoglycemia. She denies foot sores or paresthesias. She'd prefer to see if she can get her diabetes under control by diet changes and weight loss alone. She feels significantly better now with less nocturia/polyuria, less joint pain and more energy.   H/o fatty liver dxd and monitored by GI: negative hep B and C evaluation in past. Levels are improved.   She denies h/o HTN and usually runs low; has white coat response.   Hypothyroidism: stable and needs refill.   HM: due mammogram and she is scheduling. Due pnuemovax and shingrix. Other imms up to date.   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   Immunization History  Administered Date(s) Administered  . Influenza Whole 10/09/2010  . Influenza,inj,Quad PF,6+ Mos 09/20/2014  . Influenza-Unspecified 10/02/2016, 09/29/2017  . Td 12/09/1998, 01/07/2011  . Tdap 09/20/2014  . Varicella Zoster Immune Globulin 10/07/2014  . Zoster 09/08/2014    Diabetes Related Lab Review: Lab Results  Component Value Date   HGBA1C 7.4 (H) 12/23/2017   No results found for: Derl Barrow Lab Results  Component Value Date   CREATININE 0.75 12/23/2017   BUN 10 12/23/2017   NA 138 12/23/2017   K 3.7 12/23/2017   CL 106 12/23/2017   CO2 24 12/23/2017   Lab Results  Component Value Date     CHOL 132 12/23/2017   CHOL 177 12/16/2016   CHOL 158 03/23/2014   Lab Results  Component Value Date   HDL 61.30 12/23/2017   HDL 65.80 12/16/2016   HDL 69 03/23/2014   Lab Results  Component Value Date   LDLCALC 38 12/23/2017   LDLCALC 86 12/16/2016   LDLCALC 64 03/23/2014   Lab Results  Component Value Date   TRIG 162.0 (H) 12/23/2017   TRIG 129.0 12/16/2016   TRIG 124 03/23/2014   Lab Results  Component Value Date   CHOLHDL 2 12/23/2017   CHOLHDL 3 12/16/2016   CHOLHDL 2 12/24/2010   Lab Results  Component Value Date   TSH 2.04 12/23/2017   Hepatic Function Latest Ref Rng & Units 12/23/2017 12/16/2016 03/23/2014  Total Protein 6.0 - 8.3 g/dL 6.4 6.6 -  Albumin 3.5 - 5.2 g/dL 3.7 3.8 -  AST 0 - 37 U/L 49(H) 40(H) 37(A)  ALT 0 - 35 U/L 49(H) 51(H) 44(A)  Alk Phosphatase 39 - 117 U/L 100 96 91  Total Bilirubin 0.2 - 1.2 mg/dL 0.7 0.8 -  Bilirubin, Direct 0.0 - 0.3 mg/dL - 0.2 -    The 10-year ASCVD risk score Mikey Bussing DC Jr., et al., 2013) is: 4.3%   Values used to calculate the score:     Age: 29 years     Sex: Female     Is Non-Hispanic African American: No  Diabetic: No     Tobacco smoker: No     Systolic Blood Pressure: 983 mmHg     Is BP treated: No     HDL Cholesterol: 61.3 mg/dL     Total Cholesterol: 132 mg/dL I have reviewed the PMH, Fam and Soc history. Patient Active Problem List   Diagnosis Date Noted  . White coat syndrome without diagnosis of hypertension 01/16/2018    Priority: High  . Morbid obesity ([place])     Priority: High  . Acquired hypothyroidism 01/07/2011    Priority: High  . Family history of colon cancer 01/16/2018    Priority: Medium  . Family history of breast cancer 01/16/2018    Priority: Medium  . GERD (gastroesophageal reflux disease) 06/02/2014    Priority: Medium  . Irritable bowel syndrome 01/07/2011    Priority: Medium  . Fatty liver 01/07/2011    Priority: Medium    Social History: Patient  reports that  has  never smoked. she has never used smokeless tobacco. She reports that she drinks alcohol. She reports that she does not use drugs.  Review of Systems: Ophthalmic: negative for eye pain, loss of vision or double vision Cardiovascular: negative for chest pain Respiratory: negative for SOB or persistent cough Gastrointestinal: negative for abdominal pain Genitourinary: negative for dysuria or gross hematuria MSK: negative for foot lesions Neurologic: negative for weakness or gait disturbance  Objective  Vitals: BP 138/80   Pulse 77   Temp 98.3 F (36.8 C)   Ht 5' 7.25" (1.708 m)   Wt 265 lb (120.2 kg)   BMI 41.20 kg/m  General: well appearing, no acute distress  Psych:  Alert and oriented, normal mood and affect HEENT:  Normocephalic, atraumatic, moist mucous membranes, supple neck  Cardiovascular:  Nl S1 and S2, RRR without murmur, gallop or rub. no edema Respiratory:  Good breath sounds bilaterally, CTAB with normal effort, no rales Gastrointestinal: normal BS, soft, nontender Skin:  Warm, no rashes Neurologic:   Mental status is normal. normal gait Foot exam: no erythema, pallor, or cyanosis visible nl proprioception and sensation to monofilament testing bilaterally, +2 distal pulses bilaterally  Assessment  1. Type 2 diabetes mellitus without complication, without long-term current use of insulin ([place])   2. Acquired hypothyroidism   3. Fatty liver   4. Morbid obesity ([place])   5. White coat syndrome without diagnosis of hypertension      Plan   Diabetes is currently marginally controlled. Counseling and education done. Continue diabetic diet and weight loss: recheck 2-3 months and to start metformin if remains uncontrolled. Check urine to day and start ACE if indicated. Monitor bp at home and bring in log. Cholesterol is good w/o meds. Updated pneumovax today.  Hypothyroidism f/u: stable and refilled meds   Fatty liver - recommend weight loss.   HM - get mammogram.    Diabetic education: ongoing education regarding chronic disease management for diabetes was given today. We continue to reinforce the ABC's of diabetic management: A1c (<7 or 8 dependent upon patient), tight blood pressure control, and cholesterol management with goal LDL < 100 minimally. We discuss diet strategies, exercise recommendations, medication options and possible side effects. At each visit, we review recommended immunizations and preventive care recommendations for diabetics and stress that good diabetic control can prevent other problems. See below for this patient's data.  Follow up: 2-3 months for DM recheck..   Commons side effects, risks, benefits, and alternatives for medications and treatment plan prescribed today  were discussed, and the patient expressed understanding of the given instructions. Patient is instructed to call or message via MyChart if he/she has any questions or concerns regarding our treatment plan. No barriers to understanding were identified. We discussed Red Flag symptoms and signs in detail. Patient expressed understanding regarding what to do in case of urgent or emergency type symptoms.   Medication list was reconciled, printed and provided to the patient in AVS. Patient instructions and summary information was reviewed with the patient as documented in the AVS. This note was prepared with assistance of Dragon voice recognition software. Occasional wrong-word or sound-a-like substitutions may have occurred due to the inherent limitations of voice recognition software  Orders Placed This Encounter  Procedures  . Microalbumin / creatinine urine ratio   Meds ordered this encounter  Medications  . levothyroxine (SYNTHROID, LEVOTHROID) 100 MCG tablet    Sig: Take 1 tablet (100 mcg total) by mouth daily before breakfast.    Dispense:  90 tablet    Refill:  3

## 2018-01-16 NOTE — Patient Instructions (Addendum)
Please return in 2-3 months for recheck on your diabetes. Keep up the good work with your diet changes and weight loss. Start walking again.  If you have any questions or concerns, please don't hesitate to send me a message via MyChart or call the office at [phone]. Thank you for visiting with [place] today! It's our pleasure caring for you.   Diabetes Mellitus and Standards of Medical Care Managing diabetes (diabetes mellitus) can be complicated. Your diabetes treatment may be managed by a team of health care providers, including:  A diet and nutrition specialist (registered dietitian).  A nurse.  A certified diabetes educator (CDE).  A diabetes specialist (endocrinologist).  An eye doctor.  A primary care provider.  A dentist.  Your health care providers follow a schedule in order to help you get the best quality of care. The following schedule is a general guideline for your diabetes management plan. Your health care providers may also give you more specific instructions. HbA1c ( hemoglobin A1c) test This test provides information about blood sugar (glucose) control over the previous 2-3 months. It is used to check whether your diabetes management plan needs to be adjusted.  If you are meeting your treatment goals, this test is done at least 2 times a year.  If you are not meeting treatment goals or if your treatment goals have changed, this test is done 4 times a year.  Blood pressure test  This test is done at every routine medical visit. For most people, the goal is less than 130/80. Ask your health care provider what your goal blood pressure should be. Dental and eye exams  Visit your dentist two times a year.  If you have type 1 diabetes, get an eye exam 3-5 years after you are diagnosed, and then once a year after your first exam. ? If you were diagnosed with type 1 diabetes as a child, get an eye exam when you are age 54 or older and have had diabetes for 3-5 years.  After the first exam, you should get an eye exam once a year.  If you have type 2 diabetes, have an eye exam as soon as you are diagnosed, and then once a year after your first exam. Foot care exam  Visual foot exams are done at every routine medical visit. The exams check for cuts, bruises, redness, blisters, sores, or other problems with the feet.  A complete foot exam is done by your health care provider once a year. This exam includes an inspection of the structure and skin of your feet, and a check of the pulses and sensation in your feet. ? Type 1 diabetes: Get your first exam 3-5 years after diagnosis. ? Type 2 diabetes: Get your first exam as soon as you are diagnosed.  Check your feet every day for cuts, bruises, redness, blisters, or sores. If you have any of these or other problems that are not healing, contact your health care provider. Kidney function test ( urine microalbumin)  This test is done once a year. ? Type 1 diabetes: Get your first test 5 years after diagnosis. ? Type 2 diabetes: Get your first test as soon as you are diagnosed.  If you have chronic kidney disease (CKD), get a serum creatinine and estimated glomerular filtration rate (eGFR) test once a year. Lipid profile (cholesterol, HDL, LDL, triglycerides)  This test should be done when you are diagnosed with diabetes, and every 5 years after the first test. If you  are on medicines to lower your cholesterol, you may need to get this test done every year. ? The goal for LDL is less than 100 mg/dL (5.5 mmol/L). If you are at high risk, the goal is less than 70 mg/dL (3.9 mmol/L). ? The goal for HDL is 40 mg/dL (2.2 mmol/L) for men and 50 mg/dL(2.8 mmol/L) for women. An HDL cholesterol of 60 mg/dL (3.3 mmol/L) or higher gives some protection against heart disease. ? The goal for triglycerides is less than 150 mg/dL (8.3 mmol/L). Immunizations  The yearly flu (influenza) vaccine is recommended for everyone 6 months  or older who has diabetes.  The pneumonia (pneumococcal) vaccine is recommended for everyone 2 years or older who has diabetes. If you are 29 or older, you may get the pneumonia vaccine as a series of two separate shots.  The hepatitis B vaccine is recommended for adults shortly after they have been diagnosed with diabetes.  The Tdap (tetanus, diphtheria, and pertussis) vaccine should be given: ? According to normal childhood vaccination schedules, for children. ? Every 10 years, for adults who have diabetes.  The shingles vaccine is recommended for people who have had chicken pox and are 50 years or older. Mental and emotional health  Screening for symptoms of eating disorders, anxiety, and depression is recommended at the time of diagnosis and afterward as needed. If your screening shows that you have symptoms (you have a positive screening result), you may need further evaluation and be referred to a mental health care provider. Diabetes self-management education  Education about how to manage your diabetes is recommended at diagnosis and ongoing as needed. Treatment plan  Your treatment plan will be reviewed at every medical visit. Summary  Managing diabetes (diabetes mellitus) can be complicated. Your diabetes treatment may be managed by a team of health care providers.  Your health care providers follow a schedule in order to help you get the best quality of care.  Standards of care including having regular physical exams, blood tests, blood pressure monitoring, immunizations, screening tests, and education about how to manage your diabetes.  Your health care providers may also give you more specific instructions based on your individual health. This information is not intended to replace advice given to you by your health care provider. Make sure you discuss any questions you have with your health care provider. Document Released: 09/22/2009 Document Revised: 08/23/2016 Document  Reviewed: 08/23/2016 Elsevier Interactive Patient Education  2018 [employer].   Diabetes and Foot Care Diabetes may cause you to have problems because of poor blood supply (circulation) to your feet and legs. This may cause the skin on your feet to become thinner, break easier, and heal more slowly. Your skin may become dry, and the skin may peel and crack. You may also have nerve damage in your legs and feet causing decreased feeling in them. You may not notice minor injuries to your feet that could lead to infections or more serious problems. Taking care of your feet is one of the most important things you can do for yourself. Follow these instructions at home:  Wear shoes at all times, even in the house. Do not go barefoot. Bare feet are easily injured.  Check your feet daily for blisters, cuts, and redness. If you cannot see the bottom of your feet, use a mirror or ask someone for help.  Wash your feet with warm water (do not use hot water) and mild soap. Then pat your feet and the  areas between your toes until they are completely dry. Do not soak your feet as this can dry your skin.  Apply a moisturizing lotion or petroleum jelly (that does not contain alcohol and is unscented) to the skin on your feet and to dry, brittle toenails. Do not apply lotion between your toes.  Trim your toenails straight across. Do not dig under them or around the cuticle. File the edges of your nails with an emery board or nail file.  Do not cut corns or calluses or try to remove them with medicine.  Wear clean socks or stockings every day. Make sure they are not too tight. Do not wear knee-high stockings since they may decrease blood flow to your legs.  Wear shoes that fit properly and have enough cushioning. To break in new shoes, wear them for just a few hours a day. This prevents you from injuring your feet. Always look in your shoes before you put them on to be sure there are no objects inside.  Do  not cross your legs. This may decrease the blood flow to your feet.  If you find a minor scrape, cut, or break in the skin on your feet, keep it and the skin around it clean and dry. These areas may be cleansed with mild soap and water. Do not cleanse the area with peroxide, alcohol, or iodine.  When you remove an adhesive bandage, be sure not to damage the skin around it.  If you have a wound, look at it several times a day to make sure it is healing.  Do not use heating pads or hot water bottles. They may burn your skin. If you have lost feeling in your feet or legs, you may not know it is happening until it is too late.  Make sure your health care provider performs a complete foot exam at least annually or more often if you have foot problems. Report any cuts, sores, or bruises to your health care provider immediately. Contact a health care provider if:  You have an injury that is not healing.  You have cuts or breaks in the skin.  You have an ingrown nail.  You notice redness on your legs or feet.  You feel burning or tingling in your legs or feet.  You have pain or cramps in your legs and feet.  Your legs or feet are numb.  Your feet always feel cold. Get help right away if:  There is increasing redness, swelling, or pain in or around a wound.  There is a red line that goes up your leg.  Pus is coming from a wound.  You develop a fever or as directed by your health care provider.  You notice a bad smell coming from an ulcer or wound. This information is not intended to replace advice given to you by your health care provider. Make sure you discuss any questions you have with your health care provider. Document Released: 11/22/2000 Document Revised: 05/02/2016 Document Reviewed: 05/04/2013 Elsevier Interactive Patient Education  2017 [employer].   Diabetes and Foot Care Diabetes may cause you to have problems because of poor blood supply (circulation) to your feet  and legs. This may cause the skin on your feet to become thinner, break easier, and heal more slowly. Your skin may become dry, and the skin may peel and crack. You may also have nerve damage in your legs and feet causing decreased feeling in them. You may not notice minor injuries  to your feet that could lead to infections or more serious problems. Taking care of your feet is one of the most important things you can do for yourself. Follow these instructions at home:  Wear shoes at all times, even in the house. Do not go barefoot. Bare feet are easily injured.  Check your feet daily for blisters, cuts, and redness. If you cannot see the bottom of your feet, use a mirror or ask someone for help.  Wash your feet with warm water (do not use hot water) and mild soap. Then pat your feet and the areas between your toes until they are completely dry. Do not soak your feet as this can dry your skin.  Apply a moisturizing lotion or petroleum jelly (that does not contain alcohol and is unscented) to the skin on your feet and to dry, brittle toenails. Do not apply lotion between your toes.  Trim your toenails straight across. Do not dig under them or around the cuticle. File the edges of your nails with an emery board or nail file.  Do not cut corns or calluses or try to remove them with medicine.  Wear clean socks or stockings every day. Make sure they are not too tight. Do not wear knee-high stockings since they may decrease blood flow to your legs.  Wear shoes that fit properly and have enough cushioning. To break in new shoes, wear them for just a few hours a day. This prevents you from injuring your feet. Always look in your shoes before you put them on to be sure there are no objects inside.  Do not cross your legs. This may decrease the blood flow to your feet.  If you find a minor scrape, cut, or break in the skin on your feet, keep it and the skin around it clean and dry. These areas may be  cleansed with mild soap and water. Do not cleanse the area with peroxide, alcohol, or iodine.  When you remove an adhesive bandage, be sure not to damage the skin around it.  If you have a wound, look at it several times a day to make sure it is healing.  Do not use heating pads or hot water bottles. They may burn your skin. If you have lost feeling in your feet or legs, you may not know it is happening until it is too late.  Make sure your health care provider performs a complete foot exam at least annually or more often if you have foot problems. Report any cuts, sores, or bruises to your health care provider immediately. Contact a health care provider if:  You have an injury that is not healing.  You have cuts or breaks in the skin.  You have an ingrown nail.  You notice redness on your legs or feet.  You feel burning or tingling in your legs or feet.  You have pain or cramps in your legs and feet.  Your legs or feet are numb.  Your feet always feel cold. Get help right away if:  There is increasing redness, swelling, or pain in or around a wound.  There is a red line that goes up your leg.  Pus is coming from a wound.  You develop a fever or as directed by your health care provider.  You notice a bad smell coming from an ulcer or wound. This information is not intended to replace advice given to you by your health care provider. Make sure you discuss any  questions you have with your health care provider. Document Released: 11/22/2000 Document Revised: 05/02/2016 Document Reviewed: 05/04/2013 Elsevier Interactive Patient Education  2017 [employer].

## 2018-03-17 ENCOUNTER — Ambulatory Visit: Payer: BC Managed Care – PPO | Admitting: Family Medicine

## 2018-03-26 ENCOUNTER — Ambulatory Visit (INDEPENDENT_AMBULATORY_CARE_PROVIDER_SITE_OTHER): Payer: BC Managed Care – PPO | Admitting: Internal Medicine

## 2018-03-27 ENCOUNTER — Ambulatory Visit (INDEPENDENT_AMBULATORY_CARE_PROVIDER_SITE_OTHER): Payer: BC Managed Care – PPO | Admitting: Internal Medicine

## 2018-04-01 ENCOUNTER — Ambulatory Visit (INDEPENDENT_AMBULATORY_CARE_PROVIDER_SITE_OTHER): Payer: BC Managed Care – PPO | Admitting: Internal Medicine

## 2018-04-07 ENCOUNTER — Encounter: Payer: Self-pay | Admitting: Family Medicine

## 2018-04-07 ENCOUNTER — Ambulatory Visit: Payer: BC Managed Care – PPO | Admitting: Family Medicine

## 2018-04-07 ENCOUNTER — Other Ambulatory Visit: Payer: Self-pay

## 2018-04-07 VITALS — BP 124/78 | HR 68 | Temp 98.2°F | Ht 67.25 in | Wt 247.8 lb

## 2018-04-07 DIAGNOSIS — R030 Elevated blood-pressure reading, without diagnosis of hypertension: Secondary | ICD-10-CM

## 2018-04-07 DIAGNOSIS — E119 Type 2 diabetes mellitus without complications: Secondary | ICD-10-CM

## 2018-04-07 LAB — POCT GLYCOSYLATED HEMOGLOBIN (HGB A1C): Hemoglobin A1C: 5.8

## 2018-04-07 NOTE — Patient Instructions (Addendum)
Please return in 6 months for recheck.  Congratulations! You are doing a fantastic job with controlling your health. Keep up the good work!  Start logging your food: [email or web address] and [email or web address], [email or web address]   If you have any questions or concerns, please don't hesitate to send me a message via MyChart or call the office at [phone]. Thank you for visiting with [place] today! It's our pleasure caring for you.

## 2018-04-07 NOTE — Progress Notes (Signed)
Subjective  CC:  Chief Complaint  Patient presents with  . Diabetes    patient states that she has modified her diet, she states that she has cut out all sugars and has very little carbs     HPI: Shelly [name] is a 62 y.o. female who presents to the office today for follow up of diabetes and problems listed above in the chief complaint.   Diabetes follow up: Her diabetic control is reported as Improved. She has lost > 20 pounds and is eating healthy. Maybe overrestricting at this point.  She denies exertional CP or SOB or symptomatic hypoglycemia. She denies foot sores or paresthesias.   Immunization History  Administered Date(s) Administered  . Influenza Whole 10/09/2010  . Influenza,inj,Quad PF,6+ Mos 09/20/2014, 09/29/2017  . Influenza-Unspecified 10/02/2016, 09/29/2017  . Pneumococcal Polysaccharide-23 01/16/2018  . Td 12/09/1998, 01/07/2011  . Tdap 09/20/2014  . Varicella Zoster Immune Globulin 10/07/2014  . Zoster 09/08/2014  . Zoster Recombinat (Shingrix) 01/16/2018    Diabetes Related Lab Review: Lab Results  Component Value Date   HGBA1C 5.8 04/07/2018   HGBA1C 7.4 (H) 12/23/2017   HGBA1C 6.7 (H) 12/16/2016    Lab Results  Component Value Date   MICROALBUR <0.7 01/16/2018   Lab Results  Component Value Date   CREATININE 0.75 12/23/2017   BUN 10 12/23/2017   NA 138 12/23/2017   K 3.7 12/23/2017   CL 106 12/23/2017   CO2 24 12/23/2017   Lab Results  Component Value Date   CHOL 132 12/23/2017   CHOL 177 12/16/2016   CHOL 158 03/23/2014   Lab Results  Component Value Date   HDL 61.30 12/23/2017   HDL 65.80 12/16/2016   HDL 69 03/23/2014   Lab Results  Component Value Date   LDLCALC 38 12/23/2017   LDLCALC 86 12/16/2016   LDLCALC 64 03/23/2014   Lab Results  Component Value Date   TRIG 162.0 (H) 12/23/2017   TRIG 129.0 12/16/2016   TRIG 124 03/23/2014   Lab Results  Component Value Date   CHOLHDL 2 12/23/2017   CHOLHDL 3 12/16/2016     CHOLHDL 2 12/24/2010   No results found for: LDLDIRECT The 10-year ASCVD risk score Mikey Bussing DC Jr., et al., 2013) is: 6.7%   Values used to calculate the score:     Age: 56 years     Sex: Female     Is Non-Hispanic African American: No     Diabetic: Yes     Tobacco smoker: No     Systolic Blood Pressure: 253 mmHg     Is BP treated: No     HDL Cholesterol: 61.3 mg/dL     Total Cholesterol: 132 mg/dL I have reviewed the PMH, Fam and Soc history. Patient Active Problem List   Diagnosis Date Noted  . White coat syndrome without diagnosis of hypertension 01/16/2018    Priority: High  . Morbid obesity ([place])     Priority: High  . Acquired hypothyroidism 01/07/2011    Priority: High    Qualifier: Diagnosis of  By: Asa Lente MD, Jannifer Rodney Family history of colon cancer 01/16/2018    Priority: Medium    Grandparents, elderly   . Family history of breast cancer 01/16/2018    Priority: Medium    Mom and MGM, 80s   . GERD (gastroesophageal reflux disease) 06/02/2014    Priority: Medium  . Irritable bowel syndrome 01/07/2011    Priority: Medium  Qualifier: Diagnosis of  By: Asa Lente MD, Jannifer Rodney Fatty liver 01/07/2011    Priority: Medium    Qualifier: Diagnosis of  By: Asa Lente MD, Jannifer Rodney Type 2 diabetes mellitus without complication, without long-term current use of insulin ([place]) 04/07/2018    Social History: Patient  reports that she has never smoked. She has never used smokeless tobacco. She reports that she drinks alcohol. She reports that she does not use drugs.  Review of Systems: Ophthalmic: negative for eye pain, loss of vision or double vision Cardiovascular: negative for chest pain Respiratory: negative for SOB or persistent cough Gastrointestinal: negative for abdominal pain Genitourinary: negative for dysuria or gross hematuria MSK: negative for foot lesions Neurologic: negative for weakness or gait disturbance  Wt Readings from Last  3 Encounters:  04/07/18 247 lb 12.8 oz (112.4 kg)  01/16/18 265 lb (120.2 kg)  12/23/17 270 lb 8 oz (122.7 kg)   Objective  Vitals: BP 124/78   Pulse 68   Temp 98.2 F (36.8 C)   Ht 5' 7.25" (1.708 m)   Wt 247 lb 12.8 oz (112.4 kg)   BMI 38.52 kg/m  General: well appearing, no acute distress  Psych:  Alert and oriented, normal mood and affect Cardiovascular:  Nl S1 and S2, RRR without murmur, gallop or rub. no edema Respiratory:  Good breath sounds bilaterally, CTAB with normal effort, no rales   Assessment  1. Type 2 diabetes mellitus without complication, without long-term current use of insulin ([place])   2. White coat syndrome without diagnosis of hypertension      Plan   Diabetes is currently very well controlled. Now resolved. Praised and discussed for behavior changes. DM is resolving. To continue diet and weight loss; recheck 6 months.   BP even looks good now.  Diabetic education: ongoing education regarding chronic disease management for diabetes was given today. We continue to reinforce the ABC's of diabetic management: A1c (<7 or 8 dependent upon patient), tight blood pressure control, and cholesterol management with goal LDL < 100 minimally. We discuss diet strategies, exercise recommendations, medication options and possible side effects. At each visit, we review recommended immunizations and preventive care recommendations for diabetics and stress that good diabetic control can prevent other problems. See below for this patient's data.  Follow up: 6 months for recheck bp and dm and weight.   Commons side effects, risks, benefits, and alternatives for medications and treatment plan prescribed today were discussed, and the patient expressed understanding of the given instructions. Patient is instructed to call or message via MyChart if he/she has any questions or concerns regarding our treatment plan. No barriers to understanding were identified. We discussed Red Flag  symptoms and signs in detail. Patient expressed understanding regarding what to do in case of urgent or emergency type symptoms.   Medication list was reconciled, printed and provided to the patient in AVS. Patient instructions and summary information was reviewed with the patient as documented in the AVS. This note was prepared with assistance of Dragon voice recognition software. Occasional wrong-word or sound-a-like substitutions may have occurred due to the inherent limitations of voice recognition software  Orders Placed This Encounter  Procedures  . POCT glycosylated hemoglobin (Hb A1C)   No orders of the defined types were placed in this encounter.

## 2018-04-08 ENCOUNTER — Ambulatory Visit (INDEPENDENT_AMBULATORY_CARE_PROVIDER_SITE_OTHER): Payer: BC Managed Care – PPO | Admitting: Internal Medicine

## 2018-04-16 ENCOUNTER — Encounter (INDEPENDENT_AMBULATORY_CARE_PROVIDER_SITE_OTHER): Payer: Self-pay | Admitting: Internal Medicine

## 2018-04-16 ENCOUNTER — Ambulatory Visit (INDEPENDENT_AMBULATORY_CARE_PROVIDER_SITE_OTHER): Payer: BC Managed Care – PPO | Admitting: Internal Medicine

## 2018-04-16 VITALS — BP 150/80 | HR 60 | Temp 98.0°F | Ht 68.0 in | Wt 248.5 lb

## 2018-04-16 DIAGNOSIS — K219 Gastro-esophageal reflux disease without esophagitis: Secondary | ICD-10-CM | POA: Diagnosis not present

## 2018-04-16 DIAGNOSIS — K589 Irritable bowel syndrome without diarrhea: Secondary | ICD-10-CM

## 2018-04-16 MED ORDER — OMEPRAZOLE 40 MG PO CPDR: 40.0000 mg | DELAYED_RELEASE_CAPSULE | Freq: Every day | ORAL | 3 refills | Status: DC

## 2018-04-16 MED ORDER — HYOSCYAMINE SULFATE 0.125 MG SL SUBL: 0.1250 mg | SUBLINGUAL_TABLET | Freq: Every day | SUBLINGUAL | 3 refills | Status: DC

## 2018-04-16 NOTE — Patient Instructions (Signed)
OV in 1 year.  

## 2018-04-16 NOTE — Progress Notes (Signed)
Subjective:    Patient ID: Shelly [name], female    DOB: [birth year], 62 y.o.   MRN: [id number] [facility] 03/2017 268 Today her weight is 248.5. HPI Here today for f/u. Last seen in April  of 2018. Hx of chronic GERD, IBS.  Diagnosed in January with DM and she decided to lose weigh. She has lost 20 pounds.  She is on a heart healthy diet.  Her HA1C was 5.7. She is not taking any diabetic medication and exercise.  She is doing good.  Acid reflux is controlled with the Omeprazole. Sometimes if she eat strawberries, she get a little diarrhea. If she eat strawberries in moderation with a meal she is good.  BM x 1 a day.     06/24/2015 Colonoscopy: screening.  Examination performed to cecum. 4 mm polyp ablated via cold biopsy from occipital transverse colon. Small external hemorrhoids and single anal papilla.  Polyp is tubular adenoma. Next TCS in 7 years. Report to PCP  Review of Systems Past Medical History:  Diagnosis Date  . Cancer (HCC)    Basal Cell   . Diabetes mellitus without complication ([place])   . GERD (gastroesophageal reflux disease)   . Headache(784.0)   . Hypothyroidism   . Irritable bowel syndrome (IBS)     Past Surgical History:  Procedure Laterality Date  . ABDOMINAL HYSTERECTOMY    . APPENDECTOMY  1991  . COLONOSCOPY N/A 06/19/2015   Procedure: COLONOSCOPY;  Surgeon: Rogene Houston, MD;  Location: [facility];  Service: Endoscopy;  Laterality: N/A;  8:30-9:30  . cyst removed     right arm  . FLEXIBLE SIGMOIDOSCOPY  07/23/2011   Procedure: FLEXIBLE SIGMOIDOSCOPY;  Surgeon: Rogene Houston, MD;  Location: [facility];  Service: Endoscopy;  Laterality: N/A;  . TONSILLECTOMY AND ADENOIDECTOMY  1963    Allergies  Allergen Reactions  . Propoxyphene N-Acetaminophen Hives and Rash    Current Outpatient Medications on File Prior to Visit  Medication Sig Dispense Refill  . Artificial Tear Solution (SOOTHE XP OP) Apply to eye.    . calcium-vitamin D (OSCAL  WITH D) 500-200 MG-UNIT tablet Take 1 tablet by mouth as needed.     . fexofenadine (ALLEGRA) 180 MG tablet Take 180 mg by mouth daily.    . hyoscyamine (LEVSIN SL) 0.125 MG SL tablet Place 1 tablet (0.125 mg total) under the tongue daily. 90 tablet 3  . levothyroxine (SYNTHROID, LEVOTHROID) 100 MCG tablet Take 1 tablet (100 mcg total) by mouth daily before breakfast. 90 tablet 3  . omeprazole (PRILOSEC) 40 MG capsule Take 1 capsule (40 mg total) by mouth daily. 90 capsule 3  . tretinoin (RETIN-A) 0.05 % cream     . vitamin C (ASCORBIC ACID) 500 MG tablet Take 500 mg by mouth as needed.      No current facility-administered medications on file prior to visit.         Objective:   Physical Exam Blood pressure (!) 150/80, pulse 60, temperature 98 F (36.7 C), height 5\' 8"  (1.727 m), weight 248 lb 8 oz (112.7 kg). Alert and oriented. Skin warm and dry. Oral mucosa is moist.   . Sclera anicteric, conjunctivae is pink. Thyroid not enlarged. No cervical lymphadenopathy. Lungs clear. Heart regular rate and rhythm.  Abdomen is soft. Bowel sounds are positive. No hepatomegaly. No abdominal masses felt. No tenderness.  No edema to lower extremities.          Assessment & Plan:  IBS.  Continue the Levsin. She is doi ng well. GERD: Continue the Omeprazole.

## 2018-05-05 ENCOUNTER — Other Ambulatory Visit: Payer: Self-pay | Admitting: Nurse Practitioner

## 2018-05-05 ENCOUNTER — Other Ambulatory Visit: Payer: Self-pay | Admitting: Family Medicine

## 2018-05-05 DIAGNOSIS — Z1231 Encounter for screening mammogram for malignant neoplasm of breast: Secondary | ICD-10-CM

## 2018-05-25 ENCOUNTER — Ambulatory Visit: Payer: BC Managed Care – PPO

## 2018-06-04 ENCOUNTER — Ambulatory Visit
Admission: RE | Admit: 2018-06-04 | Discharge: 2018-06-04 | Disposition: A | Discharge status: Home / Self Care | Payer: BC Managed Care – PPO | Source: Ambulatory Visit | Attending: Family Medicine | Admitting: Family Medicine

## 2018-06-04 DIAGNOSIS — Z1231 Encounter for screening mammogram for malignant neoplasm of breast: Secondary | ICD-10-CM

## 2018-10-05 ENCOUNTER — Ambulatory Visit: Payer: BC Managed Care – PPO | Admitting: Family Medicine

## 2018-10-05 ENCOUNTER — Other Ambulatory Visit: Payer: Self-pay

## 2018-10-05 ENCOUNTER — Encounter: Payer: Self-pay | Admitting: Family Medicine

## 2018-10-05 VITALS — BP 124/80 | HR 57 | Temp 97.8°F | Ht 68.0 in | Wt 240.0 lb

## 2018-10-05 DIAGNOSIS — E119 Type 2 diabetes mellitus without complications: Secondary | ICD-10-CM | POA: Diagnosis not present

## 2018-10-05 DIAGNOSIS — K760 Fatty (change of) liver, not elsewhere classified: Secondary | ICD-10-CM

## 2018-10-05 DIAGNOSIS — Z23 Encounter for immunization: Secondary | ICD-10-CM | POA: Diagnosis not present

## 2018-10-05 DIAGNOSIS — R030 Elevated blood-pressure reading, without diagnosis of hypertension: Secondary | ICD-10-CM

## 2018-10-05 LAB — POCT GLYCOSYLATED HEMOGLOBIN (HGB A1C): HEMOGLOBIN A1C: 5.4 % (ref 4.0–5.6)

## 2018-10-05 NOTE — Progress Notes (Signed)
Subjective  CC:  Chief Complaint  Patient presents with  . Diabetes    last a1c was 04/30/219- 5.8, flu shot today   . Hypertension    doing well  . Weight Gain    HPI: Shelly [name] is a 62 y.o. female who presents to the office today for follow up of diabetes and problems listed above in the chief complaint.   Diabetes follow up: Her diabetic control is reported as Unchanged. Doing well. Rarely eats sweets. Was at beach for 2 weeks so ate out a lot and up 4-5 pounds: she's disappointed about this.  She denies exertional CP or SOB or symptomatic hypoglycemia. She denies foot sores or paresthesias. Diet controlled. Resolving. Due flu shot  BP: no cp sob or edema. No meds. White coat component. Eats low sodium  H/o fatty liver with mild lft elevation. No ruq pain.   Assessment  1. Type 2 diabetes mellitus without complication, without long-term current use of insulin ([place])   2. White coat syndrome without diagnosis of hypertension   3. Fatty liver      Plan   Diabetes is currently very well controlled. Continues to do well. Will recheck after holidays and if a1c is normal, can resolve problem. Flu shot today.   BP is good.   Will monitor liver tests at [facility]  Follow up: Return in about 3 months (around 01/05/2019) for complete physical, follow up Diabetes.. Orders Placed This Encounter  Procedures  . POCT glycosylated hemoglobin (Hb A1C)   No orders of the defined types were placed in this encounter.     Immunization History  Administered Date(s) Administered  . Influenza Whole 10/09/2010  . Influenza,inj,Quad PF,6+ Mos 09/20/2014, 09/29/2017  . Influenza-Unspecified 10/02/2016, 09/29/2017  . Pneumococcal Polysaccharide-23 01/16/2018  . Td 12/09/1998, 01/07/2011  . Tdap 09/20/2014  . Varicella Zoster Immune Globulin 10/07/2014  . Zoster 09/08/2014  . Zoster Recombinat (Shingrix) 01/16/2018    Diabetes Related Lab Review: Lab Results  Component Value Date     HGBA1C 5.4 10/05/2018   HGBA1C 5.8 04/07/2018   HGBA1C 7.4 (H) 12/23/2017    Lab Results  Component Value Date   MICROALBUR <0.7 01/16/2018   Lab Results  Component Value Date   CREATININE 0.75 12/23/2017   BUN 10 12/23/2017   NA 138 12/23/2017   K 3.7 12/23/2017   CL 106 12/23/2017   CO2 24 12/23/2017   Lab Results  Component Value Date   CHOL 132 12/23/2017   CHOL 177 12/16/2016   CHOL 158 03/23/2014   Lab Results  Component Value Date   HDL 61.30 12/23/2017   HDL 65.80 12/16/2016   HDL 69 03/23/2014   Lab Results  Component Value Date   LDLCALC 38 12/23/2017   LDLCALC 86 12/16/2016   LDLCALC 64 03/23/2014   Lab Results  Component Value Date   TRIG 162.0 (H) 12/23/2017   TRIG 129.0 12/16/2016   TRIG 124 03/23/2014   Lab Results  Component Value Date   CHOLHDL 2 12/23/2017   CHOLHDL 3 12/16/2016   CHOLHDL 2 12/24/2010   Lab Results  Component Value Date   ALT 49 (H) 12/23/2017   AST 49 (H) 12/23/2017   ALKPHOS 100 12/23/2017   BILITOT 0.7 12/23/2017    No results found for: LDLDIRECT The 10-year ASCVD risk score Mikey Bussing DC Jr., et al., 2013) is: 6.7%   Values used to calculate the score:     Age: 36 years  Sex: Female     Is Non-Hispanic African American: No     Diabetic: Yes     Tobacco smoker: No     Systolic Blood Pressure: 858 mmHg     Is BP treated: No     HDL Cholesterol: 61.3 mg/dL     Total Cholesterol: 132 mg/dL I have reviewed the PMH, Fam and Soc history. Patient Active Problem List   Diagnosis Date Noted  . White coat syndrome without diagnosis of hypertension 01/16/2018    Priority: High  . Morbid obesity ([place])     Priority: High  . Acquired hypothyroidism 01/07/2011    Priority: High    Qualifier: Diagnosis of  By: Asa Lente MD, Jannifer Rodney Family history of colon cancer 01/16/2018    Priority: Medium    Grandparents, elderly   . Family history of breast cancer 01/16/2018    Priority: Medium    Mom and MGM,  80s   . GERD (gastroesophageal reflux disease) 06/02/2014    Priority: Medium  . Irritable bowel syndrome 01/07/2011    Priority: Medium    Qualifier: Diagnosis of  By: Asa Lente MD, Jannifer Rodney Fatty liver 01/07/2011    Priority: Medium    Qualifier: Diagnosis of  By: Asa Lente MD, Jannifer Rodney Type 2 diabetes mellitus without complication, without long-term current use of insulin ([place]) 04/07/2018    Social History: Patient  reports that she has never smoked. She has never used smokeless tobacco. She reports that she drinks alcohol. She reports that she does not use drugs.  Review of Systems: Ophthalmic: negative for eye pain, loss of vision or double vision Cardiovascular: negative for chest pain Respiratory: negative for SOB or persistent cough Gastrointestinal: negative for abdominal pain Genitourinary: negative for dysuria or gross hematuria MSK: negative for foot lesions Neurologic: negative for weakness or gait disturbance Wt Readings from Last 3 Encounters:  10/05/18 240 lb (108.9 kg)  04/16/18 248 lb 8 oz (112.7 kg)  04/07/18 247 lb 12.8 oz (112.4 kg)    Objective  Vitals: BP 124/80   Pulse (!) 57   Temp 97.8 F (36.6 C)   Ht 5\' 8"  (1.727 m)   Wt 240 lb (108.9 kg)   BMI 36.49 kg/m  General: well appearing, no acute distress  Psych:  Alert and oriented, normal mood and affect HEENT:  Normocephalic, atraumatic, moist mucous membranes, supple neck  Cardiovascular:  Nl S1 and S2, RRR without murmur, gallop or rub. no edema Respiratory:  Good breath sounds bilaterally, CTAB with normal effort, no rales Skin:  Warm, no rashes Neurologic:   Mental status is normal. normal gait Foot exam: no erythema, pallor, or cyanosis visible nl proprioception and sensation to monofilament testing bilaterally, +2 distal pulses bilaterally    Diabetic education: ongoing education regarding chronic disease management for diabetes was given today. We continue to reinforce  the ABC's of diabetic management: A1c (<7 or 8 dependent upon patient), tight blood pressure control, and cholesterol management with goal LDL < 100 minimally. We discuss diet strategies, exercise recommendations, medication options and possible side effects. At each visit, we review recommended immunizations and preventive care recommendations for diabetics and stress that good diabetic control can prevent other problems. See below for this patient's data.    Commons side effects, risks, benefits, and alternatives for medications and treatment plan prescribed today were discussed, and the patient expressed understanding of the given instructions. Patient is instructed to  call or message via MyChart if he/she has any questions or concerns regarding our treatment plan. No barriers to understanding were identified. We discussed Red Flag symptoms and signs in detail. Patient expressed understanding regarding what to do in case of urgent or emergency type symptoms.   Medication list was reconciled, printed and provided to the patient in AVS. Patient instructions and summary information was reviewed with the patient as documented in the AVS. This note was prepared with assistance of Dragon voice recognition software. Occasional wrong-word or sound-a-like substitutions may have occurred due to the inherent limitations of voice recognition software

## 2018-10-05 NOTE — Addendum Note (Signed)
Addended bySigurd Sos on: 10/05/2018 09:47 AM   Modules accepted: Orders

## 2018-10-05 NOTE — Patient Instructions (Signed)
Please return in January for your annual complete physical; please come fasting.  Get back on track with eating at home. You will do well! Your sugars are normalizing! Your blood pressure is good as well.   If you have any questions or concerns, please don't hesitate to send me a message via MyChart or call the office at [phone]. Thank you for visiting with [place] today! It's our pleasure caring for you.

## 2019-01-05 ENCOUNTER — Ambulatory Visit (INDEPENDENT_AMBULATORY_CARE_PROVIDER_SITE_OTHER): Payer: BC Managed Care – PPO | Admitting: Family Medicine

## 2019-01-05 ENCOUNTER — Encounter: Payer: Self-pay | Admitting: Family Medicine

## 2019-01-05 ENCOUNTER — Other Ambulatory Visit: Payer: Self-pay

## 2019-01-05 VITALS — BP 128/84 | HR 64 | Temp 97.8°F | Resp 16 | Ht 68.0 in | Wt 238.6 lb

## 2019-01-05 DIAGNOSIS — R7301 Impaired fasting glucose: Secondary | ICD-10-CM | POA: Insufficient documentation

## 2019-01-05 DIAGNOSIS — R030 Elevated blood-pressure reading, without diagnosis of hypertension: Secondary | ICD-10-CM | POA: Diagnosis not present

## 2019-01-05 DIAGNOSIS — E119 Type 2 diabetes mellitus without complications: Secondary | ICD-10-CM | POA: Diagnosis not present

## 2019-01-05 DIAGNOSIS — K219 Gastro-esophageal reflux disease without esophagitis: Secondary | ICD-10-CM

## 2019-01-05 DIAGNOSIS — M1712 Unilateral primary osteoarthritis, left knee: Secondary | ICD-10-CM

## 2019-01-05 DIAGNOSIS — Z0000 Encounter for general adult medical examination without abnormal findings: Secondary | ICD-10-CM

## 2019-01-05 DIAGNOSIS — K760 Fatty (change of) liver, not elsewhere classified: Secondary | ICD-10-CM

## 2019-01-05 DIAGNOSIS — E039 Hypothyroidism, unspecified: Secondary | ICD-10-CM

## 2019-01-05 HISTORY — DX: Unilateral primary osteoarthritis, left knee: M17.12

## 2019-01-05 LAB — LIPID PANEL
CHOLESTEROL: 142 mg/dL (ref 0–200)
HDL: 60.7 mg/dL (ref >39.00)
LDL CALC: 61 mg/dL (ref 0–99)
NonHDL: 81.57
Total CHOL/HDL Ratio: 2
Triglycerides: 104 mg/dL (ref 0.0–149.0)
VLDL: 20.8 mg/dL (ref 0.0–40.0)

## 2019-01-05 LAB — COMPREHENSIVE METABOLIC PANEL
ALBUMIN: 4.1 g/dL (ref 3.5–5.2)
ALK PHOS: 84 U/L (ref 39–117)
ALT: 20 U/L (ref 0–35)
AST: 22 U/L (ref 0–37)
BILIRUBIN TOTAL: 0.7 mg/dL (ref 0.2–1.2)
BUN: 14 mg/dL (ref 6–23)
CALCIUM: 9.1 mg/dL (ref 8.4–10.5)
CHLORIDE: 105 meq/L (ref 96–112)
CO2: 25 mEq/L (ref 19–32)
CREATININE: 0.84 mg/dL (ref 0.40–1.20)
GFR: 68.05 mL/min (ref >60.00)
Glucose, Bld: 102 mg/dL — ABNORMAL HIGH (ref 70–99)
Potassium: 4.1 mEq/L (ref 3.5–5.1)
Sodium: 139 mEq/L (ref 135–145)
TOTAL PROTEIN: 6.7 g/dL (ref 6.0–8.3)

## 2019-01-05 LAB — CBC WITH DIFFERENTIAL/PLATELET
Basophils Absolute: 0.1 10*3/uL (ref 0.0–0.1)
Basophils Relative: 1 % (ref 0.0–3.0)
EOS ABS: 0.2 10*3/uL (ref 0.0–0.7)
Eosinophils Relative: 4 % (ref 0.0–5.0)
HEMATOCRIT: 41.8 % (ref 36.0–46.0)
HEMOGLOBIN: 13.9 g/dL (ref 12.0–15.0)
LYMPHS PCT: 37 % (ref 12.0–46.0)
Lymphs Abs: 2.2 10*3/uL (ref 0.7–4.0)
MCHC: 33.3 g/dL (ref 30.0–36.0)
MCV: 85.4 fl (ref 78.0–100.0)
MONO ABS: 0.6 10*3/uL (ref 0.1–1.0)
Monocytes Relative: 9.5 % (ref 3.0–12.0)
Neutro Abs: 2.9 10*3/uL (ref 1.4–7.7)
Neutrophils Relative %: 48.5 % (ref 43.0–77.0)
Platelets: 132 10*3/uL — ABNORMAL LOW (ref 150.0–400.0)
RBC: 4.89 Mil/uL (ref 3.87–5.11)
RDW: 15.9 % — ABNORMAL HIGH (ref 11.5–15.5)
WBC: 6.1 10*3/uL (ref 4.0–10.5)

## 2019-01-05 LAB — POCT GLYCOSYLATED HEMOGLOBIN (HGB A1C): HEMOGLOBIN A1C: 5.4 % (ref 4.0–5.6)

## 2019-01-05 LAB — MICROALBUMIN / CREATININE URINE RATIO
Creatinine,U: 121.7 mg/dL
Microalb Creat Ratio: 0.6 mg/g (ref 0.0–30.0)
Microalb, Ur: <0.7 mg/dL (ref 0.0–1.9)

## 2019-01-05 LAB — TSH: TSH: 2.37 u[IU]/mL (ref 0.35–4.50)

## 2019-01-05 NOTE — Patient Instructions (Addendum)
Please return in 6 months to recheck blood pressures (home readings) and sugars.   Please set up an appointment for a diabetic eye exam and have the results sent to me.   I will release your lab results to you on your MyChart account with further instructions. Please reply with any questions.   I will refill your thyroid medication after receiving the result of your thyroid test.  And Keep up the healthy diet. You no longer have Diabetes.   If you have any questions or concerns, please don't hesitate to send me a message via MyChart or call the office at [phone]. Thank you for visiting with [place] today! It's our pleasure caring for you.  Please do these things to maintain good health!   Exercise at least 30-45 minutes a day,  4-5 days a week.   Eat a low-fat diet with lots of fruits and vegetables, up to 7-9 servings per day.  Drink plenty of water daily. Try to drink 8 8oz glasses per day.  Seatbelts can save your life. Always wear your seatbelt.  Place Smoke Detectors on every level of your home and check batteries every year.  Schedule an appointment with an eye doctor for an eye exam every 1-2 years  Safe sex - use condoms to protect yourself from STDs if you could be exposed to these types of infections. Use birth control if you do not want to become pregnant and are sexually active.  Avoid heavy alcohol use. If you drink, keep it to less than 2 drinks/day and not every day.  [place].  Choose someone you trust that could speak for you if you became unable to speak for yourself.  Depression is common in our stressful world.If you're feeling down or losing interest in things you normally enjoy, please come in for a visit.  If anyone is threatening or hurting you, please get help. Physical or Emotional Violence is never OK.

## 2019-01-05 NOTE — Progress Notes (Signed)
Subjective  Chief Complaint  Patient presents with  . Annual Exam  . Diabetes  . Hypothyroidism    needs refill    HPI: Shelly [name] is a 63 y.o. female who presents to [place] at [facility] today for a Female Wellness Visit. She also has the concerns and/or needs as listed above in the chief complaint. These will be addressed in addition to the Health Maintenance Visit.   Wellness Visit: annual visit with health maintenance review and exam without Pap   HM: due for mammo in June. Pap is up to date. Due diabetic eye exam February: pt to schedule. imms are up to date. Fasting for labs today  crc screen up to date.   Obesity: continues with slow and steady weight loss. Healthier diet overall.  Chronic disease f/u and/or acute problem visit: (deemed necessary to be done in addition to the wellness visit):  Hypothyroidism: feels well without sxs of low or high thyroid. Compliant with meds. Due for refill. + dry skin;   Diabetes follow up: Her diabetic control is reported as Improved. hoping for nl a1c again today to resolve problem.  She denies exertional CP or SOB or symptomatic hypoglycemia. She denies foot sores or paresthesias. Has excellent cholesterol levels wo meds. Due eye exam  Knee OA and plantar fasciitis: saw ortho last week. Conservative care for now. No RX meds.   gerd and ibs are well controlled.   H/o fatty liver: no pain. Working on weight loss. Recheck liver today. With hep c screen.   Immunization History  Administered Date(s) Administered  . Influenza Whole 10/09/2010  . Influenza,inj,Quad PF,6+ Mos 09/20/2014, 09/29/2017, 10/05/2018  . Influenza-Unspecified 10/02/2016, 09/29/2017  . Pneumococcal Polysaccharide-23 01/16/2018  . Td 12/09/1998, 01/07/2011  . Tdap 09/20/2014  . Varicella Zoster Immune Globulin 10/07/2014  . Zoster 09/08/2014  . Zoster Recombinat (Shingrix) 01/16/2018, 10/05/2018    Diabetes Related Lab  Review: Lab Results  Component Value Date   HGBA1C 5.4 01/05/2019   HGBA1C 5.4 10/05/2018   HGBA1C 5.8 04/07/2018     Lab Results  Component Value Date   MICROALBUR <0.7 01/16/2018   Lab Results  Component Value Date   CREATININE 0.75 12/23/2017   BUN 10 12/23/2017   NA 138 12/23/2017   K 3.7 12/23/2017   CL 106 12/23/2017   CO2 24 12/23/2017   Lab Results  Component Value Date   CHOL 132 12/23/2017   CHOL 177 12/16/2016   CHOL 158 03/23/2014   Lab Results  Component Value Date   HDL 61.30 12/23/2017   HDL 65.80 12/16/2016   HDL 69 03/23/2014   Lab Results  Component Value Date   LDLCALC 38 12/23/2017   LDLCALC 86 12/16/2016   LDLCALC 64 03/23/2014   Lab Results  Component Value Date   TRIG 162.0 (H) 12/23/2017   TRIG 129.0 12/16/2016   TRIG 124 03/23/2014   Lab Results  Component Value Date   CHOLHDL 2 12/23/2017   CHOLHDL 3 12/16/2016   CHOLHDL 2 12/24/2010   No results found for: LDLDIRECT The 10-year ASCVD risk score Mikey Bussing DC Jr., et al., 2013) is: 7.1%   Values used to calculate the score:     Age: 45 years     Sex: Female     Is Non-Hispanic African American: No     Diabetic: Yes     Tobacco smoker: No     Systolic Blood Pressure: 161 mmHg     Is BP  treated: No     HDL Cholesterol: 61.3 mg/dL     Total Cholesterol: 132 mg/dL  Assessment  1. Annual physical exam   2. White coat syndrome without diagnosis of hypertension   3. Acquired hypothyroidism   4. Type 2 diabetes mellitus without complication, without long-term current use of insulin ([place])   5. Fatty liver   6. Primary osteoarthritis of left knee   7. Gastroesophageal reflux disease without esophagitis      Plan  Female Wellness Visit:  Age appropriate Health Maintenance and Prevention measures were discussed with patient. Included topics are cancer screening recommendations, ways to keep healthy (see AVS) including dietary and exercise recommendations, regular eye and dental  care, use of seat belts, and avoidance of moderate alcohol use and tobacco use.   BMI: discussed patient's BMI and encouraged positive lifestyle modifications to help get to or maintain a target BMI.  HM needs and immunizations were addressed and ordered. See below for orders. See HM and immunization section for updates.  Routine labs and screening tests ordered including cmp, cbc and lipids where appropriate.  Discussed recommendations regarding Vit D and calcium supplementation (see AVS)  Chronic disease management visit and/or acute problem visit:  DM: now resolved, downgrade dx to IFG. Check labs and urine today. Maintain healthy diet and work to healthy weight. Get eye exam next month.   Low thyroid: refill meds based on tsh from today. Clinically euthyroid  Recheck liver and renal function  GERD and IBS are controlled. On PPI.   Conservative care for OA.   Follow up: Return in about 6 months (around 07/06/2019) for recheck blood pressure and sugars and weight.  Orders Placed This Encounter  Procedures  . CBC with Differential/Platelet  . Comprehensive metabolic panel  . Lipid panel  . HIV Antibody (routine testing w rflx)  . Hepatitis C antibody  . TSH  . Microalbumin / creatinine urine ratio  . POCT glycosylated hemoglobin (Hb A1C)       Lifestyle: Body mass index is 36.28 kg/m. Wt Readings from Last 3 Encounters:  01/05/19 238 lb 9.6 oz (108.2 kg)  10/05/18 240 lb (108.9 kg)  04/16/18 248 lb 8 oz (112.7 kg)   Diet: low fat Exercise: frequently,   Patient Active Problem List   Diagnosis Date Noted  . White coat syndrome without diagnosis of hypertension 01/16/2018    Priority: High  . Acquired hypothyroidism 01/07/2011    Priority: High  . Impaired fasting glucose 01/05/2019    Priority: Medium  . Primary osteoarthritis of left knee 01/05/2019    Priority: Medium  . Family history of colon cancer 01/16/2018    Priority: Medium    Grandparents,  elderly   . Family history of breast cancer 01/16/2018    Priority: Medium    Mom and MGM, 80s   . GERD (gastroesophageal reflux disease) 06/02/2014    Priority: Medium  . Irritable bowel syndrome 01/07/2011    Priority: Medium  . Fatty liver 01/07/2011    Priority: Medium   Health Maintenance  Topic Date Due  . Hepatitis C Screening  Mar 03, 1956  . HIV Screening  01/12/1969  . OPHTHALMOLOGY EXAM  01/09/2019  . URINE MICROALBUMIN  01/16/2019  . HEMOGLOBIN A1C  04/06/2019  . MAMMOGRAM  06/05/2019  . FOOT EXAM  01/06/2020  . PAP SMEAR-Modifier  12/16/2021  . TETANUS/TDAP  09/20/2024  . COLONOSCOPY  06/18/2025  . INFLUENZA VACCINE  Completed   Immunization History  Administered Date(s) Administered  .  Influenza Whole 10/09/2010  . Influenza,inj,Quad PF,6+ Mos 09/20/2014, 09/29/2017, 10/05/2018  . Influenza-Unspecified 10/02/2016, 09/29/2017  . Pneumococcal Polysaccharide-23 01/16/2018  . Td 12/09/1998, 01/07/2011  . Tdap 09/20/2014  . Varicella Zoster Immune Globulin 10/07/2014  . Zoster 09/08/2014  . Zoster Recombinat (Shingrix) 01/16/2018, 10/05/2018   We updated and reviewed the patient's past history in detail and it is documented below. Allergies: Patient is allergic to propoxyphene n-acetaminophen. Past Medical History Patient  has a past medical history of Cancer ([place]), Diabetes mellitus without complication ([place]), GERD (gastroesophageal reflux disease), Headache(784.0), Hypothyroidism, Irritable bowel syndrome (IBS), and Primary osteoarthritis of left knee (01/05/2019). Past Surgical History Patient  has a past surgical history that includes Abdominal hysterectomy; cyst removed; Flexible sigmoidoscopy (07/23/2011); Colonoscopy (N/A, 06/19/2015); Appendectomy (1991); and Tonsillectomy and adenoidectomy (1963). Family History: Patient family history includes Atrial fibrillation (age of onset: 34) in her mother; Breast cancer (age of onset: 38) in her maternal grandmother  and mother; Colon cancer (age of onset: 69) in her maternal uncle; Colon cancer (age of onset: 71) in her maternal aunt. Social History:  Patient  reports that she has never smoked. She has never used smokeless tobacco. She reports current alcohol use. She reports that she does not use drugs.  Review of Systems: Constitutional: negative for fever or malaise Ophthalmic: negative for photophobia, double vision or loss of vision Cardiovascular: negative for chest pain, dyspnea on exertion, or new LE swelling Respiratory: negative for SOB or persistent cough Gastrointestinal: negative for abdominal pain, change in bowel habits or melena Genitourinary: negative for dysuria or gross hematuria, no abnormal uterine bleeding or disharge Musculoskeletal: negative for new gait disturbance or muscular weakness Integumentary: negative for new or persistent rashes, no breast lumps Neurological: negative for TIA or stroke symptoms Psychiatric: negative for SI or delusions Allergic/Immunologic: negative for hives  Patient Care Team    Relationship Specialty Notifications Start End  Leamon Arnt, MD PCP - General Family Medicine  01/16/18   Rogene Houston, MD  Gastroenterology  06/18/13   Druscilla Brownie, MD  Dermatology  06/18/13   Erle Crocker, MD Consulting Physician Orthopedic Surgery  01/05/19   Dr. Rona Ravens Consulting Physician Ophthalmology  01/05/19    Comment: in [place]    Objective  Vitals: BP 128/84   Pulse 64   Temp 97.8 F (36.6 C) (Oral)   Resp 16   Ht 5\' 8"  (1.727 m)   Wt 238 lb 9.6 oz (108.2 kg)   SpO2 98%   BMI 36.28 kg/m  General:  Well developed, well nourished, no acute distress  Psych:  Alert and orientedx3,normal mood and affect HEENT:  Normocephalic, atraumatic, non-icteric sclera, PERRL, oropharynx is clear without mass or exudate, supple neck without adenopathy, mass or thyromegaly Cardiovascular:  Normal S1, S2, RRR without gallop, rub or murmur, nondisplaced  PMI Respiratory:  Good breath sounds bilaterally, CTAB with normal respiratory effort Gastrointestinal: normal bowel sounds, soft, non-tender, no noted masses. No HSM MSK: no deformities, contusions. Joints are without erythema or swelling. Left knee with crepitus.  Spine and CVA region are nontender Skin:  Warm, no rashes or suspicious lesions noted Neurologic:    Mental status is normal. CN 2-11 are normal. Gross motor and sensory exams are normal. Normal gait. No tremor Breast Exam: No mass, skin retraction or nipple discharge is appreciated in either breast. No axillary adenopathy. Fibrocystic changes are not noted Diabetic Foot Exam: Appearance - no lesions, ulcers or calluses Skin - no sigificant pallor or erythema  Monofilament testing - sensitive bilaterally in following locations:  Right - Great toe, medial, central, lateral ball and posterior foot intact  Left - Great toe, medial, central, lateral ball and posterior foot intact Pulses - +2 distally bilaterally    Commons side effects, risks, benefits, and alternatives for medications and treatment plan prescribed today were discussed, and the patient expressed understanding of the given instructions. Patient is instructed to call or message via MyChart if he/she has any questions or concerns regarding our treatment plan. No barriers to understanding were identified. We discussed Red Flag symptoms and signs in detail. Patient expressed understanding regarding what to do in case of urgent or emergency type symptoms.   Medication list was reconciled, printed and provided to the patient in AVS. Patient instructions and summary information was reviewed with the patient as documented in the AVS. This note was prepared with assistance of Dragon voice recognition software. Occasional wrong-word or sound-a-like substitutions may have occurred due to the inherent limitations of voice recognition software

## 2019-01-06 LAB — HEPATITIS C ANTIBODY
Hepatitis C Ab: NONREACTIVE
SIGNAL TO CUT-OFF: 0.02 (ref <1.00)

## 2019-01-06 LAB — HIV ANTIBODY (ROUTINE TESTING W REFLEX): HIV 1&2 Ab, 4th Generation: NONREACTIVE

## 2019-01-07 ENCOUNTER — Encounter: Payer: Self-pay | Admitting: Family Medicine

## 2019-01-07 ENCOUNTER — Other Ambulatory Visit: Payer: Self-pay | Admitting: Family Medicine

## 2019-01-07 DIAGNOSIS — D696 Thrombocytopenia, unspecified: Secondary | ICD-10-CM | POA: Insufficient documentation

## 2019-01-07 DIAGNOSIS — E039 Hypothyroidism, unspecified: Secondary | ICD-10-CM

## 2019-01-07 HISTORY — DX: Thrombocytopenia, unspecified: D69.6

## 2019-01-07 MED ORDER — LEVOTHYROXINE SODIUM 100 MCG PO TABS: 100.0000 ug | ORAL_TABLET | Freq: Every day | ORAL | 3 refills | Status: DC

## 2019-04-19 ENCOUNTER — Ambulatory Visit (INDEPENDENT_AMBULATORY_CARE_PROVIDER_SITE_OTHER): Payer: BC Managed Care – PPO | Admitting: Internal Medicine

## 2019-05-13 ENCOUNTER — Other Ambulatory Visit (INDEPENDENT_AMBULATORY_CARE_PROVIDER_SITE_OTHER): Payer: Self-pay | Admitting: Internal Medicine

## 2019-05-13 ENCOUNTER — Telehealth (INDEPENDENT_AMBULATORY_CARE_PROVIDER_SITE_OTHER): Payer: Self-pay | Admitting: Internal Medicine

## 2019-05-13 DIAGNOSIS — K589 Irritable bowel syndrome without diarrhea: Secondary | ICD-10-CM

## 2019-05-13 DIAGNOSIS — K219 Gastro-esophageal reflux disease without esophagitis: Secondary | ICD-10-CM

## 2019-05-13 MED ORDER — HYOSCYAMINE SULFATE 0.125 MG SL SUBL: 0.1250 mg | SUBLINGUAL_TABLET | Freq: Every day | SUBLINGUAL | 3 refills | Status: DC

## 2019-05-13 NOTE — Telephone Encounter (Signed)
Rx for Levsin sent to her pharmacy

## 2019-07-06 ENCOUNTER — Other Ambulatory Visit: Payer: Self-pay

## 2019-07-06 ENCOUNTER — Ambulatory Visit (INDEPENDENT_AMBULATORY_CARE_PROVIDER_SITE_OTHER): Payer: Medicare Other | Admitting: Family Medicine

## 2019-07-06 ENCOUNTER — Encounter: Payer: Self-pay | Admitting: Family Medicine

## 2019-07-06 VITALS — BP 122/80 | HR 67 | Temp 98.1°F | Resp 16 | Ht 68.0 in | Wt 245.8 lb

## 2019-07-06 DIAGNOSIS — R7301 Impaired fasting glucose: Secondary | ICD-10-CM

## 2019-07-06 DIAGNOSIS — E039 Hypothyroidism, unspecified: Secondary | ICD-10-CM

## 2019-07-06 DIAGNOSIS — R030 Elevated blood-pressure reading, without diagnosis of hypertension: Secondary | ICD-10-CM | POA: Diagnosis not present

## 2019-07-06 DIAGNOSIS — Z6835 Body mass index (BMI) 35.0-35.9, adult: Secondary | ICD-10-CM

## 2019-07-06 LAB — POCT GLYCOSYLATED HEMOGLOBIN (HGB A1C): Hemoglobin A1C: 5.9 % — AB (ref 4.0–5.6)

## 2019-07-06 MED ORDER — PREVNAR 13 IM SUSP: 0.5000 mL | Freq: Once | INTRAMUSCULAR | 0 refills | Status: AC

## 2019-07-06 NOTE — Patient Instructions (Signed)
Please return in 3 months for diabetes follow up  Please call to schedule your mammogram, bone density and eye exam. All are due.  Please take the Prevnar RX to the pharmacy so they can administer the vaccination. This way insurance will cover the cost.   If you have any questions or concerns, please don't hesitate to send me a message via MyChart or call the office at [phone]. Thank you for visiting with Shelly [name] today! It's our pleasure caring for you.  Eat better! :)

## 2019-07-06 NOTE — Progress Notes (Signed)
Subjective  CC:  Chief Complaint  Patient presents with  . Diabetes  . Hypothyroidism    HPI: Shelly [name] is a 63 y.o. female who presents to the office today for follow up of diabetes and problems listed above in the chief complaint.   Diabetes (prediabetes)  follow up: Her diabetic control is reported as Worse. Has gained weight due to covid changes. Is working on getting it off again. No sxs of hyperglycemia.  She denies exertional CP or SOB or symptomatic hypoglycemia. She denies foot sores or paresthesias. Last a2c was normal. Needs eye exam; postponed due to covid  bp- h/o elevated readings w/o dx; feels fine.   Thyroid: euthyroid in January. Feels well. No sxs of high or low thyroid. Compliant with meds  Obesity: now eating better again. Baked a lot over the last several months. Also was less active due to not keeping her granddaughter.  Wt Readings from Last 3 Encounters:  07/06/19 245 lb 12.8 oz (111.5 kg)  01/05/19 238 lb 9.6 oz (108.2 kg)  10/05/18 240 lb (108.9 kg)    BP Readings from Last 3 Encounters:  07/06/19 122/80  01/05/19 128/84  10/05/18 124/80    Assessment  1. Impaired fasting glucose   2. White coat syndrome without diagnosis of hypertension   3. Acquired hypothyroidism   4. Severe obesity (BMI 35.0-35.9 with comorbidity) ([place])      Plan   Diabetes is currently very well controlled. Diet controlled;prediabetes. Work on weight loss again and low carb, low processed food diet. Pt to schedule eye exam. RX given for Prevnar.   bp is normal  Thyroid is well controlled.   Obesity: nutrition counseling given.   Follow up: No follow-ups on file.. Orders Placed This Encounter  Procedures  . POCT glycosylated hemoglobin (Hb A1C)   Meds ordered this encounter  Medications  . pneumococcal 13-valent conjugate vaccine (PREVNAR 13) SUSP injection    Sig: Inject 0.5 mLs into the muscle once for 1 dose.    Dispense:  0.5 mL    Refill:  0     Immunization History  Administered Date(s) Administered  . Influenza Whole 10/09/2010  . Influenza,inj,Quad PF,6+ Mos 09/20/2014, 09/29/2017, 10/05/2018  . Influenza-Unspecified 10/02/2016, 09/29/2017  . Pneumococcal Polysaccharide-23 01/16/2018  . Td 12/09/1998, 01/07/2011  . Tdap 09/20/2014  . Varicella Zoster Immune Globulin 10/07/2014  . Zoster 09/08/2014  . Zoster Recombinat (Shingrix) 01/16/2018, 10/05/2018    Diabetes Related Lab Review: Lab Results  Component Value Date   HGBA1C 5.9 (A) 07/06/2019   HGBA1C 5.4 01/05/2019   HGBA1C 5.4 10/05/2018    Lab Results  Component Value Date   MICROALBUR <0.7 01/05/2019   Lab Results  Component Value Date   CREATININE 0.84 01/05/2019   BUN 14 01/05/2019   NA 139 01/05/2019   K 4.1 01/05/2019   CL 105 01/05/2019   CO2 25 01/05/2019   Lab Results  Component Value Date   CHOL 142 01/05/2019   CHOL 132 12/23/2017   CHOL 177 12/16/2016   Lab Results  Component Value Date   HDL 60.70 01/05/2019   HDL 61.30 12/23/2017   HDL 65.80 12/16/2016   Lab Results  Component Value Date   LDLCALC 61 01/05/2019   LDLCALC 38 12/23/2017   LDLCALC 86 12/16/2016   Lab Results  Component Value Date   TRIG 104.0 01/05/2019   TRIG 162.0 (H) 12/23/2017   TRIG 129.0 12/16/2016   Lab Results  Component Value Date  CHOLHDL 2 01/05/2019   CHOLHDL 2 12/23/2017   CHOLHDL 3 12/16/2016   No results found for: LDLDIRECT The 10-year ASCVD risk score Mikey Bussing DC Jr., et al., 2013) is: 7.7%   Values used to calculate the score:     Age: 57 years     Sex: Female     Is Non-Hispanic African American: No     Diabetic: Yes     Tobacco smoker: No     Systolic Blood Pressure: 762 mmHg     Is BP treated: No     HDL Cholesterol: 60.7 mg/dL     Total Cholesterol: 142 mg/dL I have reviewed the PMH, Fam and Soc history. Patient Active Problem List   Diagnosis Date Noted  . White coat syndrome without diagnosis of hypertension 01/16/2018     Priority: High  . Severe obesity (BMI 35.0-35.9 with comorbidity) ([place])     Priority: High  . Acquired hypothyroidism 01/07/2011    Priority: High  . Impaired fasting glucose 01/05/2019    Priority: Medium  . Primary osteoarthritis of left knee 01/05/2019    Priority: Medium  . Family history of colon cancer 01/16/2018    Priority: Medium    Grandparents, elderly   . Family history of breast cancer 01/16/2018    Priority: Medium    Mom and MGM, 80s   . GERD (gastroesophageal reflux disease) 06/02/2014    Priority: Medium  . Irritable bowel syndrome 01/07/2011    Priority: Medium  . Fatty liver 01/07/2011    Priority: Medium  . Thrombopenia ([place]) 01/07/2019    Priority: Low    Chronic by lab review     Social History: Patient  reports that she has never smoked. She has never used smokeless tobacco. She reports current alcohol use. She reports that she does not use drugs.  Review of Systems: Ophthalmic: negative for eye pain, loss of vision or double vision Cardiovascular: negative for chest pain Respiratory: negative for SOB or persistent cough Gastrointestinal: negative for abdominal pain Genitourinary: negative for dysuria or gross hematuria MSK: negative for foot lesions Neurologic: negative for weakness or gait disturbance  Objective  Vitals: BP 122/80   Pulse 67   Temp 98.1 F (36.7 C) (Oral)   Resp 16   Ht 5\' 8"  (1.727 m)   Wt 245 lb 12.8 oz (111.5 kg)   SpO2 97%   BMI 37.37 kg/m  General: well appearing, no acute distress  Psych:  Alert and oriented, normal mood and affect HEENT:  Normocephalic, atraumatic, moist mucous membranes, supple neck  Cardiovascular:  Nl S1 and S2, RRR without murmur, gallop or rub. no edema Respiratory:  Good breath sounds bilaterally, CTAB with normal effort, no rales Gastrointestinal: normal BS, soft, nontender Skin:  Warm, no rashes Neurologic:   Mental status is normal. normal gait Foot exam: no erythema, pallor, or  cyanosis visible nl proprioception and sensation to monofilament testing bilaterally, +2 distal pulses bilaterally    Diabetic education: ongoing education regarding chronic disease management for diabetes was given today. We continue to reinforce the ABC's of diabetic management: A1c (<7 or 8 dependent upon patient), tight blood pressure control, and cholesterol management with goal LDL < 100 minimally. We discuss diet strategies, exercise recommendations, medication options and possible side effects. At each visit, we review recommended immunizations and preventive care recommendations for diabetics and stress that good diabetic control can prevent other problems. See below for this patient's data.    Commons side effects, risks, benefits,  and alternatives for medications and treatment plan prescribed today were discussed, and the patient expressed understanding of the given instructions. Patient is instructed to call or message via MyChart if he/she has any questions or concerns regarding our treatment plan. No barriers to understanding were identified. We discussed Red Flag symptoms and signs in detail. Patient expressed understanding regarding what to do in case of urgent or emergency type symptoms.   Medication list was reconciled, printed and provided to the patient in AVS. Patient instructions and summary information was reviewed with the patient as documented in the AVS. This note was prepared with assistance of Dragon voice recognition software. Occasional wrong-word or sound-a-like substitutions may have occurred due to the inherent limitations of voice recognition software

## 2019-07-07 ENCOUNTER — Other Ambulatory Visit: Payer: Self-pay | Admitting: Family Medicine

## 2019-07-07 DIAGNOSIS — Z1231 Encounter for screening mammogram for malignant neoplasm of breast: Secondary | ICD-10-CM

## 2019-08-12 ENCOUNTER — Encounter (INDEPENDENT_AMBULATORY_CARE_PROVIDER_SITE_OTHER): Payer: Self-pay

## 2019-08-15 NOTE — Progress Notes (Signed)
Subjective:    Patient ID: Shelly [name], female    DOB: October 10, 1956, 62 y.o.   MRN: [id number]  HPI Shelly [name] is a 63 year old female with a past medical history of DM II, hypothyroidism, GERD, IBS and colon polyps. Maternal uncle and maternal aunt with history of colon cancer. Sister possibly had colon polyps. She presents today for her annual follow up. She reports her GERD is well controlled on Omeprazole 40mg  once daily.  She infrequently takes ibuprofen 200 mg 2 to 3 tablets once daily for headaches or arthritis pain. She denies having any dysphagia, heartburn or stomach pain.  Complains of having constipation for the past 8 months.  She is passing a solid stool every day or every other day but often has to strain.  She feels pressure in the rectum which is relieved after she passes a bowel movement.  No rectal bleeding or melena.  She was diagnosed with diabetes mellitus type 10 January 2018.  Since that time, she has reduced her sugar and carbohydrate intake.  She is eating fruit daily.  She drinks at least 5 to 6 glasses of water daily.  No other complaints today.   Colonoscopy by Dr. Laural Golden 06/19/2015:  Prep excellent. A 4 mm tubular adenomatous polyp ablated via cold biopsy from proximal transverse colon. Mucosa of rest of the colon and rectum was normal. Small hemorrhoids below the dentate line along with anal papilla. 56yr recall advised.  Past Medical History:  Diagnosis Date  . Cancer (HCC)    Basal Cell   . Diabetes mellitus without complication ([place])   . GERD (gastroesophageal reflux disease)   . Headache(784.0)   . Hypothyroidism   . Irritable bowel syndrome (IBS)   . Primary osteoarthritis of left knee 01/05/2019  . Thrombopenia ([place]) 01/07/2019   Chronic by lab review   Past Surgical History:  Procedure Laterality Date  . ABDOMINAL HYSTERECTOMY    . APPENDECTOMY  1991  . COLONOSCOPY N/A 06/19/2015   Procedure: COLONOSCOPY;  Surgeon: Rogene Houston, MD;   [facility];  Service: Endoscopy;  Laterality: N/A;  8:30-9:30  . cyst removed     right arm  . FLEXIBLE SIGMOIDOSCOPY  07/23/2011   Procedure: FLEXIBLE SIGMOIDOSCOPY;  Surgeon: Rogene Houston, MD;  [facility];  Service: Endoscopy;  Laterality: N/A;  . TONSILLECTOMY AND ADENOIDECTOMY  1963   Family History  Problem Relation Age of Onset  . Breast cancer Mother 63  . Atrial fibrillation Mother 9       s/p ablation  . Breast cancer Maternal Grandmother 80  . Colon cancer Maternal Aunt 80  . Colon cancer Maternal Uncle 79   Review of Systems  HPI, all other systems reviewed and are negative     Objective:   Physical Exam BP 124/80   Pulse 69   Temp 98.1 F (36.7 C)   Ht 5\' 8"  (1.727 m)   Wt 246 lb 11.2 oz (111.9 kg)   BMI 37.51 kg/m  General: 63 year old female no acute distress Eyes: Sclera nonicteric, conjunctiva pink Heart: Regular rate and rhythm, no murmurs Lungs: Breath sounds clear throughout Abdomen: Soft, nontender, no masses or organomegaly, positive bowel sounds to all 4 quadrants Extremities: No edema    Assessment & Plan:   1. GERD, stable -Discussed reducing omeprazole to 20 mg once daily, however, patient prefers to continue omeprazole 40 mg once daily.    2.  Constipation  -Benefiber 1 tablespoon  by mouth once daily -Miralax 1 capful mixed in 8 ounces of water at bed time -patient to call me in 2 weeks with update  -follow up in the office in 4 to 6 months   2. IBS, previously with diarrhea -Hyoscyamine as needed  3. Hx of a small tubular adenomatous polyp at time of colonoscopy in 10/ 2016.  Sister with ? history of colon polyps. Maternal aunt and maternal uncle with history of colon cancer -change colonoscopy recall from 7 to 5 years, earlier if sx warrant

## 2019-08-17 ENCOUNTER — Ambulatory Visit (INDEPENDENT_AMBULATORY_CARE_PROVIDER_SITE_OTHER): Payer: Medicare Other | Admitting: Nurse Practitioner

## 2019-08-17 ENCOUNTER — Encounter (INDEPENDENT_AMBULATORY_CARE_PROVIDER_SITE_OTHER): Payer: Self-pay | Admitting: Nurse Practitioner

## 2019-08-17 ENCOUNTER — Other Ambulatory Visit: Payer: Self-pay

## 2019-08-17 VITALS — BP 124/80 | HR 69 | Temp 98.1°F | Ht 68.0 in | Wt 246.7 lb

## 2019-08-17 DIAGNOSIS — K219 Gastro-esophageal reflux disease without esophagitis: Secondary | ICD-10-CM

## 2019-08-17 DIAGNOSIS — K5900 Constipation, unspecified: Secondary | ICD-10-CM | POA: Diagnosis not present

## 2019-08-17 NOTE — Patient Instructions (Signed)
1. Benefiber 1 tablespoon once daily if tolerated  2.  MiraLAX 1 capful mixed in 8 ounces of water daily  3.  Continue omeprazole 40 mg once daily  4.  Follow-up in the office in 4 to 6 months  5.  Recommend repeat colonoscopy July 2021

## 2019-08-24 ENCOUNTER — Ambulatory Visit
Admission: RE | Admit: 2019-08-24 | Discharge: 2019-08-24 | Disposition: A | Discharge status: Home / Self Care | Payer: Medicare Other | Source: Ambulatory Visit | Attending: Family Medicine | Admitting: Family Medicine

## 2019-08-24 ENCOUNTER — Other Ambulatory Visit: Payer: Self-pay

## 2019-08-24 DIAGNOSIS — Z1231 Encounter for screening mammogram for malignant neoplasm of breast: Secondary | ICD-10-CM

## 2019-09-23 ENCOUNTER — Telehealth: Payer: Self-pay | Admitting: Family Medicine

## 2019-09-23 NOTE — Telephone Encounter (Signed)
Pt aware we do not have blood type, can get type if ever given blood or OOP screening

## 2019-09-23 NOTE — Telephone Encounter (Signed)
Pt would like to know her blood type or how she can get that information/ please advise

## 2019-09-23 NOTE — Telephone Encounter (Signed)
See note

## 2019-10-13 ENCOUNTER — Other Ambulatory Visit: Payer: Self-pay

## 2019-10-13 ENCOUNTER — Ambulatory Visit: Payer: Medicare Other | Admitting: Family Medicine

## 2019-10-13 ENCOUNTER — Encounter: Payer: Self-pay | Admitting: Family Medicine

## 2019-10-13 VITALS — BP 128/86 | HR 67 | Temp 98.3°F | Resp 16 | Wt 250.0 lb

## 2019-10-13 DIAGNOSIS — R7301 Impaired fasting glucose: Secondary | ICD-10-CM | POA: Diagnosis not present

## 2019-10-13 DIAGNOSIS — Z0000 Encounter for general adult medical examination without abnormal findings: Secondary | ICD-10-CM

## 2019-10-13 DIAGNOSIS — R030 Elevated blood-pressure reading, without diagnosis of hypertension: Secondary | ICD-10-CM | POA: Diagnosis not present

## 2019-10-13 LAB — POCT GLYCOSYLATED HEMOGLOBIN (HGB A1C): Hemoglobin A1C: 5.5 % (ref 4.0–5.6)

## 2019-10-13 NOTE — Progress Notes (Signed)
Subjective:    Shelly [name] is a 63 y.o. female who presents for a Welcome to Medicare exam.   Review of Systems  Constitutional: negative for fever or malaise Ophthalmic: negative for photophobia, double vision or loss of vision Cardiovascular: negative for chest pain, dyspnea on exertion, or new LE swelling Respiratory: negative for SOB or persistent cough Gastrointestinal: negative for abdominal pain, change in bowel habits or melena Genitourinary: negative for dysuria or gross hematuria Musculoskeletal: negative for new gait disturbance or muscular weakness Integumentary: negative for new or persistent rashes Neurological: negative for TIA or stroke symptoms Psychiatric: negative for SI or delusions Allergic/Immunologic: negative for hives  Cardiac Risk Factors include: advanced age (>41men, >19 women)      Objective:    Today's Vitals   10/13/19 1047  BP: 128/86  Pulse: 67  Resp: 16  Temp: 98.3 F (36.8 C)  TempSrc: Tympanic  SpO2: 96%  Weight: 250 lb (113.4 kg)  Body mass index is 38.01 kg/m.  Medications Outpatient Encounter Medications as of 10/13/2019  Medication Sig  . Artificial Tear Solution (SOOTHE XP OP) Apply to eye.  . fexofenadine (ALLEGRA) 180 MG tablet Take 180 mg by mouth daily.  . hyoscyamine (LEVSIN SL) 0.125 MG SL tablet TAKE 1 TABLET SUBLINGUALLY ONCE DAILY  . levothyroxine (SYNTHROID, LEVOTHROID) 100 MCG tablet Take 1 tablet (100 mcg total) by mouth daily before breakfast.  . omeprazole (PRILOSEC) 40 MG capsule TAKE 1 CAPSULE BY MOUTH ONCE DAILY.  Marland Kitchen tretinoin (RETIN-A) 0.05 % cream    No facility-administered encounter medications on file as of 10/13/2019.      History: Past Medical History:  Diagnosis Date  . Cancer (HCC)    Basal Cell   . Diabetes mellitus without complication ([place])   . GERD (gastroesophageal reflux disease)   . Headache(784.0)   . Hypothyroidism   . Irritable bowel syndrome (IBS)   . Primary osteoarthritis  of left knee 01/05/2019  . Thrombopenia ([place]) 01/07/2019   Chronic by lab review   Past Surgical History:  Procedure Laterality Date  . ABDOMINAL HYSTERECTOMY    . APPENDECTOMY  1991  . COLONOSCOPY N/A 06/19/2015   Procedure: COLONOSCOPY;  Surgeon: Rogene Houston, MD;  [facility];  Service: Endoscopy;  Laterality: N/A;  8:30-9:30  . cyst removed     right arm  . FLEXIBLE SIGMOIDOSCOPY  07/23/2011   Procedure: FLEXIBLE SIGMOIDOSCOPY;  Surgeon: Rogene Houston, MD;  [facility];  Service: Endoscopy;  Laterality: N/A;  . TONSILLECTOMY AND ADENOIDECTOMY  1963    Family History  Problem Relation Age of Onset  . Breast cancer Mother 39  . Atrial fibrillation Mother 90       s/p ablation  . Breast cancer Maternal Grandmother 80  . Colon cancer Maternal Aunt 80  . Colon cancer Maternal Uncle 44   Social History   Occupational History  . Not on file  Tobacco Use  . Smoking status: Never Smoker  . Smokeless tobacco: Never Used  Substance and Sexual Activity  . Alcohol use: Yes    Comment: social  . Drug use: No  . Sexual activity: Yes    Birth control/protection: Surgical    Tobacco Counseling Counseling given: Yes   Immunizations and Health Maintenance Immunization History  Administered Date(s) Administered  . Influenza Whole 10/09/2010  . Influenza,inj,Quad PF,6+ Mos 09/20/2014, 09/29/2017, 10/05/2018, 09/15/2019  . Influenza-Unspecified 10/02/2016, 09/29/2017  . Pneumococcal Conjugate-13 09/15/2019  . Pneumococcal Polysaccharide-23 01/16/2018  .  Td 12/09/1998, 01/07/2011  . Tdap 09/20/2014  . Varicella Zoster Immune Globulin 10/07/2014  . Zoster 09/08/2014  . Zoster Recombinat (Shingrix) 01/16/2018, 10/05/2018   Health Maintenance Due  Topic Date Due  . DEXA SCAN  01/12/2019    Activities of Daily Living In your present state of health, do you have any difficulty performing the following activities: 10/13/2019 01/05/2019  Hearing? N N   Vision? Y Y  Comment - Wears glasses  Difficulty concentrating or making decisions? N N  Walking or climbing stairs? N N  Dressing or bathing? N N  Doing errands, shopping? N N  Preparing Food and eating ? N -  Using the Toilet? N -  In the past six months, have you accidently leaked urine? N -  Do you have problems with loss of bowel control? N -  Managing your Medications? N -  Managing your Finances? N -  Housekeeping or managing your Housekeeping? N -  Some recent data might be hidden    Physical Exam   Objective  Vitals: BP 128/86   Pulse 67   Temp 98.3 F (36.8 C) (Tympanic)   Resp 16   Wt 250 lb (113.4 kg)   SpO2 96%   BMI 38.01 kg/m  General:  Well developed, well nourished, no acute distress  Psych:  Alert and orientedx3,normal mood and affect Cardiovascular:  Normal S1, S2, RRR without gallop, rub or murmur, nondisplaced PMI Respiratory:  Good breath sounds bilaterally, CTAB with normal respiratory effort Neurologic:    Mental status is normal. (optional), or other factors deemed appropriate based on the beneficiary's medical and social history and current clinical standards.  Advanced Directives: handout given to patient. See avs.       Assessment:    This is a routine wellness examination for this patient . She is well. Due dexa; other screens up to date. No new concerns identified. Needs Advanced directives on file.   Vision/Hearing screen No exam data present  Dietary issues and exercise activities discussed:  Current Exercise Habits: Home exercise routine, Type of exercise: walking, Time (Minutes): 60, Frequency (Times/Week): 4, Weekly Exercise (Minutes/Week): 240, Intensity: Moderate  Goals   None    Depression Screen PHQ 2/9 Scores 10/13/2019 01/05/2019 04/07/2018 01/16/2018  PHQ - 2 Score 0 0 0 0  PHQ- 9 Score - - 0 0     Fall Risk Fall Risk  10/13/2019  Falls in the past year? 1  Number falls in past yr: 0  Injury with Fall? 0  Risk for fall  due to : History of fall(s)  Follow up Falls evaluation completed    Cognitive Function: no concerns, normal Mini-cog testing        Patient Care Team: Leamon Arnt, MD as PCP - General (Family Medicine) Rogene Houston, MD (Gastroenterology) Druscilla Brownie, MD (Dermatology) Erle Crocker, MD as Consulting Physician (Orthopedic Surgery) Dr. Rona Ravens as Consulting Physician (Ophthalmology)     Plan:    welcome to medicare visit:   I have personally reviewed and noted the following in the patient's chart:   . Medical and social history . Use of alcohol, tobacco or illicit drugs  . Current medications and supplements . Functional ability and status . Nutritional status . Physical activity . Advanced directives . List of other physicians . Hospitalizations, surgeries, and ER visits in previous 12 months . Vitals . Screenings to include cognitive, depression, and falls . Referrals and appointments  In addition, I have reviewed  and discussed with patient certain preventive protocols, quality metrics, and best practice recommendations. A written personalized care plan for preventive services as well as general preventive health recommendations were provided to patient.     Leamon Arnt, MD 10/13/2019

## 2019-10-13 NOTE — Progress Notes (Signed)
Ekg: stable when compared to 2012; NSR< nonspecific changes and poor R wave progression. No q waves

## 2019-10-13 NOTE — Patient Instructions (Addendum)
Please return in January 2021  for for your annual complete physical; please come fasting.  If you have any questions or concerns, please don't hesitate to send me a message via MyChart or call the office at [phone]. Thank you for visiting with [place] today! It's our pleasure caring for you.   Shelly [name] , Thank you for taking time to come for your Medicare Wellness Visit. I appreciate your ongoing commitment to your health goals. Please review the following plan we discussed and let me know if I can assist you in the future.     This is a list of the screening recommended for you and due dates:  Health Maintenance  Topic Date Due  . DEXA scan (bone density measurement)  01/12/2019  . Complete foot exam   01/06/2020  . Hemoglobin A1C  01/06/2020  . Urine Protein Check  01/06/2020  . Mammogram  08/23/2020  . Eye exam for diabetics  09/11/2020  . Pap Smear  12/16/2021  . Pneumonia vaccines (2 of 2 - PPSV23) 01/16/2023  . Tetanus Vaccine  09/20/2024  . Colon Cancer Screening  06/18/2025  . Flu Shot  Completed  .  Hepatitis C: One time screening is recommended by Center for Disease Control  (CDC) for  adults born from [birth year] through 1967.   Completed  . HIV Screening  Completed   Please read the patient education material about Living [place].    Both the Living Will and [place] require a Insurance account manager to [employer] on either document.  You may complete just one or both or neither of the documents. It is voluntaary. However, it can be very helpful if needed and I strongly encourage you to complete both forms stating your Advanced Directives. This will help me and your other healthcare providers care for you.  If you choose to complete either or both documents, please bring in or mail copies of the completed documents to my office so that I may put them in your medical chart. This way will ensure we have them if needed.     Advance Directive  Advance directives are legal documents that let you make choices ahead of time about your health care and medical treatment in case you become unable to communicate for yourself. Advance directives are a way for you to communicate your wishes to family, friends, and health care providers. This can help convey your decisions about end-of-life care if you become unable to communicate. Discussing and writing advance directives should happen over time rather than all at once. Advance directives can be changed depending on your situation and what you want, even after you have signed the advance directives. If you do not have an advance directive, some states assign family decision makers to act on your behalf based on how closely you are related to them. Each state has its own laws regarding advance directives. You may want to check with your health care provider, attorney, or state representative about the laws in your state. There are different types of advance directives, such as:  Medical power of attorney.  Living will.  Do not resuscitate (DNR) or do not attempt resuscitation (DNAR) order. Health care proxy and medical power of attorney A health care proxy, also called a health care agent, is a person who is appointed to make medical decisions for you in cases in which you are unable to make the decisions yourself. Generally, people choose someone they  know well and trust to represent their preferences. Make sure to ask this person for an agreement to act as your proxy. A proxy may have to exercise judgment in the event of a medical decision for which your wishes are not known. A medical power of attorney is a legal document that names your health care proxy. Depending on the laws in your state, after the document is written, it may also need to be:  Signed.  Notarized.  Dated.  Copied.  Witnessed.  Incorporated into your medical record. You may also want to  appoint someone to manage your financial affairs in a situation in which you are unable to do so. This is called a durable power of attorney for finances. It is a separate legal document from the durable power of attorney for health care. You may choose the same person or someone different from your health care proxy to act as your agent in financial matters. If you do not appoint a proxy, or if there is a concern that the proxy is not acting in your best interests, a court-appointed guardian may be designated to act on your behalf. Living will A living will is a set of instructions documenting your wishes about medical care when you cannot express them yourself. Health care providers should keep a copy of your living will in your medical record. You may want to give a copy to family members or friends. To alert caregivers in case of an emergency, you can place a card in your wallet to let them know that you have a living will and where they can find it. A living will is used if you become:  Terminally ill.  Incapacitated.  Unable to communicate or make decisions. Items to consider in your living will include:  The use or non-use of life-sustaining equipment, such as dialysis machines and breathing machines (ventilators).  A DNR or DNAR order, which is the instruction not to use cardiopulmonary resuscitation (CPR) if breathing or heartbeat stops.  The use or non-use of tube feeding.  Withholding of food and fluids.  Comfort (palliative) care when the goal becomes comfort rather than a cure.  Organ and tissue donation. A living will does not give instructions for distributing your money and property if you should pass away. It is recommended that you seek the advice of a lawyer when writing a will. Decisions about taxes, beneficiaries, and asset distribution will be legally binding. This process can relieve your family and friends of any concerns surrounding disputes or questions that may come  up about the distribution of your assets. DNR or DNAR A DNR or DNAR order is a request not to have CPR in the event that your heart stops beating or you stop breathing. If a DNR or DNAR order has not been made and shared, a health care provider will try to help any patient whose heart has stopped or who has stopped breathing. If you plan to have surgery, talk with your health care provider about how your DNR or DNAR order will be followed if problems occur. Summary  Advance directives are the legal documents that allow you to make choices ahead of time about your health care and medical treatment in case you become unable to communicate for yourself.  The process of discussing and writing advance directives should happen over time. You can change the advance directives, even after you have signed them.  Advance directives include DNR or DNAR orders, living wills, and designating an agent  as your medical power of attorney. This information is not intended to replace advice given to you by your health care provider. Make sure you discuss any questions you have with your health care provider. Document Released: 03/03/2008 Document Revised: 12/30/2018 Document Reviewed: 10/14/2016 Elsevier Patient Education  2020 [employer].

## 2019-12-30 ENCOUNTER — Ambulatory Visit (INDEPENDENT_AMBULATORY_CARE_PROVIDER_SITE_OTHER): Payer: Medicare PPO | Admitting: Family Medicine

## 2019-12-30 ENCOUNTER — Encounter: Payer: Self-pay | Admitting: Family Medicine

## 2019-12-30 ENCOUNTER — Other Ambulatory Visit: Payer: Self-pay

## 2019-12-30 VITALS — BP 120/86 | HR 69 | Temp 98.2°F | Ht 68.0 in | Wt 248.0 lb

## 2019-12-30 DIAGNOSIS — E039 Hypothyroidism, unspecified: Secondary | ICD-10-CM

## 2019-12-30 DIAGNOSIS — K760 Fatty (change of) liver, not elsewhere classified: Secondary | ICD-10-CM

## 2019-12-30 DIAGNOSIS — E2839 Other primary ovarian failure: Secondary | ICD-10-CM

## 2019-12-30 DIAGNOSIS — Z0000 Encounter for general adult medical examination without abnormal findings: Secondary | ICD-10-CM | POA: Diagnosis not present

## 2019-12-30 DIAGNOSIS — K219 Gastro-esophageal reflux disease without esophagitis: Secondary | ICD-10-CM

## 2019-12-30 DIAGNOSIS — J309 Allergic rhinitis, unspecified: Secondary | ICD-10-CM | POA: Insufficient documentation

## 2019-12-30 DIAGNOSIS — R7301 Impaired fasting glucose: Secondary | ICD-10-CM

## 2019-12-30 DIAGNOSIS — D696 Thrombocytopenia, unspecified: Secondary | ICD-10-CM

## 2019-12-30 DIAGNOSIS — R030 Elevated blood-pressure reading, without diagnosis of hypertension: Secondary | ICD-10-CM

## 2019-12-30 DIAGNOSIS — K589 Irritable bowel syndrome without diarrhea: Secondary | ICD-10-CM

## 2019-12-30 LAB — LIPID PANEL
Cholesterol: 148 mg/dL (ref 0–200)
HDL: 62.3 mg/dL (ref >39.00)
LDL Cholesterol: 64 mg/dL (ref 0–99)
NonHDL: 85.73
Total CHOL/HDL Ratio: 2
Triglycerides: 108 mg/dL (ref 0.0–149.0)
VLDL: 21.6 mg/dL (ref 0.0–40.0)

## 2019-12-30 LAB — CBC WITH DIFFERENTIAL/PLATELET
Basophils Absolute: 0.1 10*3/uL (ref 0.0–0.1)
Basophils Relative: 0.8 % (ref 0.0–3.0)
Eosinophils Absolute: 0.3 10*3/uL (ref 0.0–0.7)
Eosinophils Relative: 3.7 % (ref 0.0–5.0)
HCT: 42.5 % (ref 36.0–46.0)
Hemoglobin: 14 g/dL (ref 12.0–15.0)
Lymphocytes Relative: 36.7 % (ref 12.0–46.0)
Lymphs Abs: 2.5 10*3/uL (ref 0.7–4.0)
MCHC: 32.9 g/dL (ref 30.0–36.0)
MCV: 83.7 fl (ref 78.0–100.0)
Monocytes Absolute: 0.6 10*3/uL (ref 0.1–1.0)
Monocytes Relative: 8.9 % (ref 3.0–12.0)
Neutro Abs: 3.4 10*3/uL (ref 1.4–7.7)
Neutrophils Relative %: 49.9 % (ref 43.0–77.0)
Platelets: 148 10*3/uL — ABNORMAL LOW (ref 150.0–400.0)
RBC: 5.08 Mil/uL (ref 3.87–5.11)
RDW: 15.5 % (ref 11.5–15.5)
WBC: 6.8 10*3/uL (ref 4.0–10.5)

## 2019-12-30 LAB — COMPREHENSIVE METABOLIC PANEL
ALT: 22 U/L (ref 0–35)
AST: 25 U/L (ref 0–37)
Albumin: 4.1 g/dL (ref 3.5–5.2)
Alkaline Phosphatase: 79 U/L (ref 39–117)
BUN: 13 mg/dL (ref 6–23)
CO2: 28 mEq/L (ref 19–32)
Calcium: 9.1 mg/dL (ref 8.4–10.5)
Chloride: 105 mEq/L (ref 96–112)
Creatinine, Ser: 0.92 mg/dL (ref 0.40–1.20)
GFR: 61.08 mL/min (ref >60.00)
Glucose, Bld: 108 mg/dL — ABNORMAL HIGH (ref 70–99)
Potassium: 4.2 mEq/L (ref 3.5–5.1)
Sodium: 139 mEq/L (ref 135–145)
Total Bilirubin: 0.6 mg/dL (ref 0.2–1.2)
Total Protein: 7 g/dL (ref 6.0–8.3)

## 2019-12-30 LAB — MICROALBUMIN / CREATININE URINE RATIO
Creatinine,U: 27.9 mg/dL
Microalb Creat Ratio: 2.5 mg/g (ref 0.0–30.0)
Microalb, Ur: <0.7 mg/dL (ref 0.0–1.9)

## 2019-12-30 LAB — POCT GLYCOSYLATED HEMOGLOBIN (HGB A1C): Hemoglobin A1C: 5.8 % — AB (ref 4.0–5.6)

## 2019-12-30 LAB — TSH: TSH: 3.58 u[IU]/mL (ref 0.35–4.50)

## 2019-12-30 MED ORDER — OMEPRAZOLE 20 MG PO CPDR: 20.0000 mg | DELAYED_RELEASE_CAPSULE | Freq: Every day | ORAL | 3 refills | Status: DC

## 2019-12-30 NOTE — Patient Instructions (Addendum)
Please return in 6 months for prediabetes recheck.  I will release your lab results to you on your MyChart account with further instructions. Please reply with any questions.   I will refill your thyroid medication once lab results return. I have decreased the dose of your omeprazole. Let me know if you have problems.  Keep walking!   If you have any questions or concerns, please don't hesitate to send me a message via MyChart or call the office at [phone]. Thank you for visiting with [place] today! It's our pleasure caring for you.  Please do these things to maintain good health!   Exercise at least 30-45 minutes a day,  4-5 days a week.   Eat a low-fat diet with lots of fruits and vegetables, up to 7-9 servings per day.  Drink plenty of water daily. Try to drink 8 8oz glasses per day.  Seatbelts can save your life. Always wear your seatbelt.  Place Smoke Detectors on every level of your home and check batteries every year.  Schedule an appointment with an eye doctor for an eye exam every 1-2 years  Safe sex - use condoms to protect yourself from STDs if you could be exposed to these types of infections. Use birth control if you do not want to become pregnant and are sexually active.  Avoid heavy alcohol use. If you drink, keep it to less than 2 drinks/day and not every day.  [place].  Choose someone you trust that could speak for you if you became unable to speak for yourself.  Depression is common in our stressful world.If you're feeling down or losing interest in things you normally enjoy, please come in for a visit.  If anyone is threatening or hurting you, please get help. Physical or Emotional Violence is never OK.

## 2019-12-30 NOTE — Progress Notes (Signed)
Subjective  Chief Complaint  Patient presents with  . Annual Exam  . Diabetes    HPI: Shelly [name] is a 64 y.o. female who presents to [facility] today for a Female Wellness Visit. She also has the concerns and/or needs as listed above in the chief complaint. These will be addressed in addition to the Health Maintenance Visit.   Wellness Visit: annual visit with health maintenance review and exam without Pap   HM: screens are up to date. Will get dexa with mammo in September. imms up to date.  Chronic disease f/u and/or acute problem visit: (deemed necessary to be done in addition to the wellness visit):  Low thyroid. Stable clinically. Compliant with meds. Due for recheck.   White coat htn: normotensive here today.  IFG: will recheck today. Has reversed diabetes with diet changes.   gerd on ppi. Controlled.   Monitoring lfts w/ h/o fatty liver.    Assessment  1. Annual physical exam   2. White coat syndrome without diagnosis of hypertension   3. Acquired hypothyroidism   4. Impaired fasting glucose   5. Gastroesophageal reflux disease, unspecified whether esophagitis present   6. Fatty liver   7. Hypoestrogenism   8. Chronic allergic rhinitis   9. Gastroesophageal reflux disease without esophagitis   10. Irritable bowel syndrome, unspecified type   11. Thrombocytopenia [facility])      Plan  Female Wellness Visit:  Age appropriate Health Maintenance and Prevention measures were discussed with patient. Included topics are cancer screening recommendations, ways to keep healthy (see AVS) including dietary and exercise recommendations, regular eye and dental care, use of seat belts, and avoidance of moderate alcohol use and tobacco use.   BMI: discussed patient's BMI and encouraged positive lifestyle modifications to help get to or maintain a target BMI.  HM needs and immunizations were addressed and ordered. See below for orders. See HM and  immunization section for updates.  Routine labs and screening tests ordered including cmp, cbc and lipids where appropriate.  Discussed recommendations regarding Vit D and calcium supplementation (see AVS)  Chronic disease management visit and/or acute problem visit:  Recheck liver tests, thyroid and a1c. Continue healthy diet. No change in meds  GERD:? Trial of lowering PPI dose.   Allergies are active  IBS is well controlled.  IFG: continues to walk 5x/week and eat well. Weight is stable. Off meds.   Low platelets; not worked up prior but chronic. Recheck today. No bruising or petechiae or bleeding  Fatty liver: monitor lfts Follow up: Return in about 6 months (around 06/28/2020) for recheck sugars.  Orders Placed This Encounter  Procedures  . DG Bone Density  . CBC with Differential/Platelet  . Comprehensive metabolic panel  . Lipid panel  . TSH  . Microalbumin / creatinine urine ratio  . POCT glycosylated hemoglobin (Hb A1C)   Meds ordered this encounter  Medications  . omeprazole (PRILOSEC) 20 MG capsule    Sig: Take 1 capsule (20 mg total) by mouth daily.    Dispense:  90 capsule    Refill:  3      Lifestyle: Body mass index is 37.71 kg/m. Wt Readings from Last 3 Encounters:  12/30/19 248 lb (112.5 kg)  10/13/19 250 lb (113.4 kg)  08/17/19 246 lb 11.2 oz (111.9 kg)    Patient Active Problem List   Diagnosis Date Noted  . White coat syndrome without diagnosis of hypertension 01/16/2018    Priority:  High  . Severe obesity (BMI 35.0-35.9 with comorbidity) ([place])     Priority: High  . Acquired hypothyroidism 01/07/2011    Priority: High  . Impaired fasting glucose 01/05/2019    Priority: Medium  . Primary osteoarthritis of left knee 01/05/2019    Priority: Medium  . Family history of colon cancer 01/16/2018    Priority: Medium    Grandparents, elderly   . Family history of breast cancer 01/16/2018    Priority: Medium    Mom and MGM, 80s   . GERD  (gastroesophageal reflux disease) 06/02/2014    Priority: Medium  . Irritable bowel syndrome 01/07/2011    Priority: Medium  . Fatty liver 01/07/2011    Priority: Medium  . Chronic allergic rhinitis 12/30/2019    Priority: Low  . Thrombocytopenia ([place]) 01/07/2019    Priority: Low    Chronic by lab review   . Constipation 08/17/2019   Health Maintenance  Topic Date Due  . URINE MICROALBUMIN  01/06/2020  . DEXA SCAN  10/03/2020 (Originally 01/12/2019)  . FOOT EXAM  01/06/2020  . HEMOGLOBIN A1C  06/28/2020  . MAMMOGRAM  08/23/2020  . OPHTHALMOLOGY EXAM  09/11/2020  . PAP SMEAR-Modifier  12/16/2021  . PNA vac Low Risk Adult (2 of 2 - PPSV23) 01/16/2023  . TETANUS/TDAP  09/20/2024  . COLONOSCOPY  06/18/2025  . INFLUENZA VACCINE  Completed  . Hepatitis C Screening  Completed  . HIV Screening  Completed   Immunization History  Administered Date(s) Administered  . Influenza Whole 10/09/2010  . Influenza,inj,Quad PF,6+ Mos 09/20/2014, 09/29/2017, 10/05/2018, 09/15/2019  . Influenza-Unspecified 10/02/2016, 09/29/2017  . Pneumococcal Conjugate-13 09/15/2019  . Pneumococcal Polysaccharide-23 01/16/2018  . Td 12/09/1998, 01/07/2011  . Tdap 09/20/2014  . Varicella Zoster Immune Globulin 10/07/2014  . Zoster 09/08/2014  . Zoster Recombinat (Shingrix) 01/16/2018, 10/05/2018   We updated and reviewed the patient's past history in detail and it is documented below. Allergies: Patient is allergic to propoxyphene n-acetaminophen. Past Medical History Patient  has a past medical history of Cancer ([place]), Diabetes mellitus without complication ([place]), GERD (gastroesophageal reflux disease), Headache(784.0), Hypothyroidism, Irritable bowel syndrome (IBS), Primary osteoarthritis of left knee (01/05/2019), and Thrombopenia ([place]) (01/07/2019). Past Surgical History Patient  has a past surgical history that includes Abdominal hysterectomy; cyst removed; Flexible sigmoidoscopy (07/23/2011); Colonoscopy  (N/A, 06/19/2015); Appendectomy (1991); and Tonsillectomy and adenoidectomy (1963). Family History: Patient family history includes Atrial fibrillation (age of onset: 70) in her mother; Breast cancer (age of onset: 31) in her maternal grandmother and mother; Colon cancer (age of onset: 20) in her maternal uncle; Colon cancer (age of onset: 71) in her maternal aunt. Social History:  Patient  reports that she has never smoked. She has never used smokeless tobacco. She reports current alcohol use. She reports that she does not use drugs.  Review of Systems: Constitutional: negative for fever or malaise Ophthalmic: negative for photophobia, double vision or loss of vision Cardiovascular: negative for chest pain, dyspnea on exertion, or new LE swelling Respiratory: negative for SOB or persistent cough Gastrointestinal: negative for abdominal pain, change in bowel habits or melena Genitourinary: negative for dysuria or gross hematuria, no abnormal uterine bleeding or disharge Musculoskeletal: negative for new gait disturbance or muscular weakness Integumentary: negative for new or persistent rashes, no breast lumps Neurological: negative for TIA or stroke symptoms Psychiatric: negative for SI or delusions Allergic/Immunologic: negative for hives  Patient Care Team    Relationship Specialty Notifications Start End  Leamon Arnt, MD PCP -  General Family Medicine  01/16/18   Rogene Houston, MD  Gastroenterology  06/18/13   Druscilla Brownie, MD  Dermatology  06/18/13   Erle Crocker, MD Consulting Physician Orthopedic Surgery  01/05/19   Dr. Rona Ravens Consulting Physician Ophthalmology  01/05/19    Comment: in [place]    Objective  Vitals: BP 120/86 (BP Location: Right Arm, Patient Position: Sitting, Cuff Size: Large)   Pulse 69   Temp 98.2 F (36.8 C) (Temporal)   Ht 5\' 8"  (1.727 m)   Wt 248 lb (112.5 kg)   SpO2 95%   BMI 37.71 kg/m  General:  Well developed, well nourished, no acute  distress  Psych:  Alert and orientedx3,normal mood and affect HEENT:  Normocephalic, atraumatic, non-icteric sclera, PERRL, oropharynx is clear without mass or exudate, supple neck without adenopathy, mass or thyromegaly Cardiovascular:  Normal S1, S2, RRR without gallop, rub or murmur, nondisplaced PMI Respiratory:  Good breath sounds bilaterally, CTAB with normal respiratory effort Gastrointestinal: normal bowel sounds, soft, non-tender, no noted masses. No HSM MSK: no deformities, contusions. Joints are without erythema or swelling. Spine and CVA region are nontender Skin:  Warm, no rashes or suspicious lesions noted Neurologic:    Mental status is normal. CN 2-11 are normal. Gross motor and sensory exams are normal. Normal gait. No tremor Breast Exam: No mass, skin retraction or nipple discharge is appreciated in either breast. No axillary adenopathy. Fibrocystic changes are not noted Diabetic Foot Exam: Appearance - no lesions, ulcers or calluses Skin - no sigificant pallor or erythema Monofilament testing - sensitive bilaterally in following locations:  Right - Great toe, medial, central, lateral ball and posterior foot intact  Left - Great toe, medial, central, lateral ball and posterior foot intact Pulses - +2 distally bilaterally    Commons side effects, risks, benefits, and alternatives for medications and treatment plan prescribed today were discussed, and the patient expressed understanding of the given instructions. Patient is instructed to call or message via MyChart if he/she has any questions or concerns regarding our treatment plan. No barriers to understanding were identified. We discussed Red Flag symptoms and signs in detail. Patient expressed understanding regarding what to do in case of urgent or emergency type symptoms.   Medication list was reconciled, printed and provided to the patient in AVS. Patient instructions and summary information was reviewed with the patient  as documented in the AVS. This note was prepared with assistance of Dragon voice recognition software. Occasional wrong-word or sound-a-like substitutions may have occurred due to the inherent limitations of voice recognition software  This visit occurred during the SARS-CoV-2 public health emergency.  Safety protocols were in place, including screening questions prior to the visit, additional usage of staff PPE, and extensive cleaning of exam room while observing appropriate contact time as indicated for disinfecting solutions.

## 2019-12-31 MED ORDER — LEVOTHYROXINE SODIUM 100 MCG PO TABS: 100.0000 ug | ORAL_TABLET | Freq: Every day | ORAL | 3 refills | Status: DC

## 2019-12-31 NOTE — Addendum Note (Signed)
Addended by: Billey Chang on: 12/31/2019 08:48 AM   Modules accepted: Orders

## 2020-02-15 ENCOUNTER — Ambulatory Visit (INDEPENDENT_AMBULATORY_CARE_PROVIDER_SITE_OTHER): Payer: Medicare Other | Admitting: Internal Medicine

## 2020-05-25 ENCOUNTER — Other Ambulatory Visit (INDEPENDENT_AMBULATORY_CARE_PROVIDER_SITE_OTHER): Payer: Self-pay | Admitting: Gastroenterology

## 2020-05-25 DIAGNOSIS — K589 Irritable bowel syndrome without diarrhea: Secondary | ICD-10-CM

## 2020-05-31 ENCOUNTER — Encounter (INDEPENDENT_AMBULATORY_CARE_PROVIDER_SITE_OTHER): Payer: Self-pay | Admitting: *Deleted

## 2020-06-05 ENCOUNTER — Other Ambulatory Visit: Payer: Self-pay | Admitting: Family Medicine

## 2020-06-05 DIAGNOSIS — Z1231 Encounter for screening mammogram for malignant neoplasm of breast: Secondary | ICD-10-CM

## 2020-06-05 DIAGNOSIS — E2839 Other primary ovarian failure: Secondary | ICD-10-CM

## 2020-06-08 ENCOUNTER — Encounter: Payer: Self-pay | Admitting: Family Medicine

## 2020-06-08 ENCOUNTER — Telehealth (INDEPENDENT_AMBULATORY_CARE_PROVIDER_SITE_OTHER): Payer: Medicare PPO | Admitting: Family Medicine

## 2020-06-08 ENCOUNTER — Other Ambulatory Visit: Payer: Self-pay

## 2020-06-08 VITALS — Temp 99.2°F | Ht 68.0 in | Wt 246.0 lb

## 2020-06-08 DIAGNOSIS — J019 Acute sinusitis, unspecified: Secondary | ICD-10-CM

## 2020-06-08 DIAGNOSIS — B9689 Other specified bacterial agents as the cause of diseases classified elsewhere: Secondary | ICD-10-CM

## 2020-06-08 MED ORDER — AZITHROMYCIN 250 MG PO TABS: ORAL_TABLET | ORAL | 0 refills | Status: DC

## 2020-06-08 MED ORDER — BENZONATATE 100 MG PO CAPS: 100.0000 mg | ORAL_CAPSULE | Freq: Two times a day (BID) | ORAL | 0 refills | Status: DC | PRN

## 2020-06-08 NOTE — Progress Notes (Signed)
Virtual Visit via Video Note  Subjective  CC:  Chief Complaint  Patient presents with  . Sinusitis    x 7 days cough, running eyes, congesion, pressure in her head, OTC meds not helping much.      I connected with Allie Dimmer on 06/08/20 at  9:00 AM EDT by a video enabled telemedicine application and verified that I am speaking with the correct person using two identifiers. Location patient: Home Location provider: [facility], Office Persons participating in the virtual visit: SKIE VITRANO, Leamon Arnt, MD Reymundo Poll CMA  I discussed the limitations of evaluation and management by telemedicine and the availability of in person appointments. The patient expressed understanding and agreed to proceed. HPI: Shelly [name] is a 64 y.o. female who was contacted today to address the problems listed above in the chief complaint. . 64 yo with URI sxs now with thick green nasal and postnasal drainage, irritant cough, sinus pain and pressure with frontal headache, and malaise.  URI and sinus infections have been going through her family with both grandchildren on antibiotics for same.  She denies shortness of breath, wheezing or ear pain.  She has had sinus infection in the past and this feels similar.  Its been a long time since she is had any antibiotics.  Covid testing throughout the family has been negative.  Appetite is unaffected.  She is taking Mucinex DM Assessment  1. Acute bacterial sinusitis      Plan   URI with possible secondary bacterial sinusitis: Educated, add decongestant, Tessalon Perles and Z-Pak to cover for infection.  Follow-up if not improving.  Supportive care measures discussed I discussed the assessment and treatment plan with the patient. The patient was provided an opportunity to ask questions and all were answered. The patient agreed with the plan and demonstrated an understanding of the instructions.   The  patient was advised to call back or seek an in-person evaluation if the symptoms worsen or if the condition fails to improve as anticipated. Follow up: No follow-ups on file.  06/27/2020  Meds ordered this encounter  Medications  . azithromycin (ZITHROMAX) 250 MG tablet    Sig: Take 2 tabs today, then 1 tab daily for 4 days    Dispense:  1 each    Refill:  0  . benzonatate (TESSALON) 100 MG capsule    Sig: Take 1 capsule (100 mg total) by mouth 2 (two) times daily as needed for cough.    Dispense:  20 capsule    Refill:  0      I reviewed the patients updated PMH, FH, and SocHx.    Patient Active Problem List   Diagnosis Date Noted  . White coat syndrome without diagnosis of hypertension 01/16/2018    Priority: High  . Severe obesity (BMI 35.0-35.9 with comorbidity) ([place])     Priority: High  . Acquired hypothyroidism 01/07/2011    Priority: High  . Impaired fasting glucose 01/05/2019    Priority: Medium  . Primary osteoarthritis of left knee 01/05/2019    Priority: Medium  . Family history of colon cancer 01/16/2018    Priority: Medium  . Family history of breast cancer 01/16/2018    Priority: Medium  . GERD (gastroesophageal reflux disease) 06/02/2014    Priority: Medium  . Irritable bowel syndrome 01/07/2011    Priority: Medium  . Fatty liver 01/07/2011    Priority: Medium  . Chronic  allergic rhinitis 12/30/2019    Priority: Low  . Thrombocytopenia ([place]) 01/07/2019    Priority: Low  . Constipation 08/17/2019   Current Meds  Medication Sig  . Artificial Tear Solution (SOOTHE XP OP) Apply to eye.  . fexofenadine (ALLEGRA) 180 MG tablet Take 180 mg by mouth daily.  . hyoscyamine (LEVSIN SL) 0.125 MG SL tablet TAKE 1 TABLET SUBLINGUALLY ONCE DAILY  . levothyroxine (SYNTHROID) 100 MCG tablet Take 1 tablet (100 mcg total) by mouth daily before breakfast.  . omeprazole (PRILOSEC) 20 MG capsule Take 1 capsule (20 mg total) by mouth daily.  Marland Kitchen tretinoin (RETIN-A) 0.05 %  cream     Allergies: Patient is allergic to propoxyphene n-acetaminophen. Family History: Patient family history includes Atrial fibrillation (age of onset: 60) in her mother; Breast cancer (age of onset: 33) in her maternal grandmother and mother; Colon cancer (age of onset: 89) in her maternal uncle; Colon cancer (age of onset: 14) in her maternal aunt. Social History:  Patient  reports that she has never smoked. She has never used smokeless tobacco. She reports current alcohol use. She reports that she does not use drugs.  Review of Systems: Constitutional: Negative for fever malaise or anorexia Cardiovascular: negative for chest pain Respiratory: negative for SOB or persistent cough Gastrointestinal: negative for abdominal pain  OBJECTIVE Vitals: Temp 99.2 F (37.3 C) (Temporal)   Ht 5\' 8"  (1.727 m)   Wt 246 lb (111.6 kg)   BMI 37.40 kg/m  General: no acute distress , A&Ox3 Nasal congestion present no respiratory distress  Leamon Arnt, MD

## 2020-06-27 ENCOUNTER — Encounter: Payer: Self-pay | Admitting: Family Medicine

## 2020-06-27 ENCOUNTER — Ambulatory Visit: Payer: Medicare PPO | Admitting: Family Medicine

## 2020-06-27 ENCOUNTER — Other Ambulatory Visit: Payer: Self-pay

## 2020-06-27 VITALS — BP 124/72 | HR 72 | Ht 68.0 in | Wt 248.4 lb

## 2020-06-27 DIAGNOSIS — R030 Elevated blood-pressure reading, without diagnosis of hypertension: Secondary | ICD-10-CM

## 2020-06-27 DIAGNOSIS — J309 Allergic rhinitis, unspecified: Secondary | ICD-10-CM

## 2020-06-27 DIAGNOSIS — R7301 Impaired fasting glucose: Secondary | ICD-10-CM | POA: Diagnosis not present

## 2020-06-27 DIAGNOSIS — Z6835 Body mass index (BMI) 35.0-35.9, adult: Secondary | ICD-10-CM | POA: Diagnosis not present

## 2020-06-27 DIAGNOSIS — K219 Gastro-esophageal reflux disease without esophagitis: Secondary | ICD-10-CM | POA: Diagnosis not present

## 2020-06-27 LAB — POCT GLYCOSYLATED HEMOGLOBIN (HGB A1C): Hemoglobin A1C: 5.9 % — AB (ref 4.0–5.6)

## 2020-06-27 NOTE — Progress Notes (Signed)
Subjective  CC:  Chief Complaint  Patient presents with  . Diabetes    HPI: Shelly [name] is a 64 y.o. female who presents to the office today for follow up of diabetes and problems listed above in the chief complaint.   IFG/obestiy/allergy follow up: doing well overall. Hasn't been walking due to recent illness but sinus infection has resolved - still with allergy sxs: pnd and sneezing and congestion (clear rhinorrhea). No sxs of hyperglycemia. Continues a heart heatlhy low sugar diet. Wants to lose more weight and is happy to do it with healthy eating, exercise and w/o prescription medications.   Gerd: we decreased dose of PPI in January and she has done well. No active sxs of gerd.   bp f/u; has h/o white coat syndrome but has been normotensive last two visits here with me. Low sodium heart healthy diet.  HM: mammo and dexa scheduled for sept. Eye exam due in October. imms up to date  Wt Readings from Last 3 Encounters:  06/27/20 248 lb 6.4 oz (112.7 kg)  06/08/20 246 lb (111.6 kg)  12/30/19 248 lb (112.5 kg)    BP Readings from Last 3 Encounters:  06/27/20 124/72  12/30/19 120/86  10/13/19 128/86    Assessment  1. Impaired fasting glucose   2. White coat syndrome without diagnosis of hypertension   3. Severe obesity (BMI 35.0-35.9 with comorbidity) ([place])   4. Gastroesophageal reflux disease, unspecified whether esophagitis present   5. Chronic allergic rhinitis      Plan   IFG: stable. She is determined to continue healthy diet.   bp is normal. Monitor q 6 months  Obesity: has goal of 50 pound weight loss. Continue diet and restart exercise  AR: add flonase to oral antihistamine daily to help with sx control. Infection has resolved. (see last note)  GERD: remains controlled, can try off meds for several weeks to see if still needs PPI.  Follow up: 6 months for cpe Orders Placed This Encounter  Procedures  . POCT HgB A1C   No orders of the defined  types were placed in this encounter.     Immunization History  Administered Date(s) Administered  . Influenza Whole 10/09/2010  . Influenza,inj,Quad PF,6+ Mos 09/20/2014, 09/29/2017, 10/05/2018, 09/15/2019  . Influenza-Unspecified 10/02/2016, 09/29/2017  . Moderna SARS-COVID-2 Vaccination 12/30/2019, 02/04/2020  . Pneumococcal Conjugate-13 09/15/2019  . Pneumococcal Polysaccharide-23 01/16/2018  . Td 12/09/1998, 01/07/2011  . Tdap 09/20/2014  . Varicella Zoster Immune Globulin 10/07/2014  . Zoster 09/08/2014  . Zoster Recombinat (Shingrix) 01/16/2018, 10/05/2018    Diabetes Related Lab Review: Lab Results  Component Value Date   HGBA1C 5.9 (A) 06/27/2020   HGBA1C 5.8 (A) 12/30/2019   HGBA1C 5.5 10/13/2019    Lab Results  Component Value Date   MICROALBUR <0.7 12/30/2019   Lab Results  Component Value Date   CREATININE 0.92 12/30/2019   BUN 13 12/30/2019   NA 139 12/30/2019   K 4.2 12/30/2019   CL 105 12/30/2019   CO2 28 12/30/2019   Lab Results  Component Value Date   CHOL 148 12/30/2019   CHOL 142 01/05/2019   CHOL 132 12/23/2017   Lab Results  Component Value Date   HDL 62.30 12/30/2019   HDL 60.70 01/05/2019   HDL 61.30 12/23/2017   Lab Results  Component Value Date   LDLCALC 64 12/30/2019   LDLCALC 61 01/05/2019   LDLCALC 38 12/23/2017   Lab Results  Component Value Date  TRIG 108.0 12/30/2019   TRIG 104.0 01/05/2019   TRIG 162.0 (H) 12/23/2017   Lab Results  Component Value Date   CHOLHDL 2 12/30/2019   CHOLHDL 2 01/05/2019   CHOLHDL 2 12/23/2017   No results found for: LDLDIRECT The 10-year ASCVD risk score Mikey Bussing DC Jr., et al., 2013) is: 9.1%   Values used to calculate the score:     Age: 89 years     Sex: Female     Is Non-Hispanic African American: No     Diabetic: Yes     Tobacco smoker: No     Systolic Blood Pressure: 829 mmHg     Is BP treated: No     HDL Cholesterol: 62.3 mg/dL     Total Cholesterol: 148 mg/dL I have  reviewed the PMH, Fam and Soc history. Patient Active Problem List   Diagnosis Date Noted  . White coat syndrome without diagnosis of hypertension 01/16/2018    Priority: High  . Severe obesity (BMI 35.0-35.9 with comorbidity) ([place])     Priority: High  . Acquired hypothyroidism 01/07/2011    Priority: High  . Impaired fasting glucose 01/05/2019    Priority: Medium  . Primary osteoarthritis of left knee 01/05/2019    Priority: Medium  . Family history of colon cancer 01/16/2018    Priority: Medium    Grandparents, elderly   . Family history of breast cancer 01/16/2018    Priority: Medium    Mom and MGM, 80s   . GERD (gastroesophageal reflux disease) 06/02/2014    Priority: Medium  . Irritable bowel syndrome 01/07/2011    Priority: Medium  . Fatty liver 01/07/2011    Priority: Medium  . Chronic allergic rhinitis 12/30/2019    Priority: Low  . Thrombocytopenia ([place]) 01/07/2019    Priority: Low    Chronic by lab review   . Constipation 08/17/2019    Social History: Patient  reports that she has never smoked. She has never used smokeless tobacco. She reports current alcohol use. She reports that she does not use drugs.  Review of Systems: Ophthalmic: negative for eye pain, loss of vision or double vision Cardiovascular: negative for chest pain Respiratory: negative for SOB or persistent cough Gastrointestinal: negative for abdominal pain Genitourinary: negative for dysuria or gross hematuria MSK: negative for foot lesions Neurologic: negative for weakness or gait disturbance  Objective  Vitals: BP 124/72   Pulse 72   Ht 5\' 8"  (1.727 m)   Wt 248 lb 6.4 oz (112.7 kg)   SpO2 96%   BMI 37.77 kg/m  General: well appearing, no acute distress  Psych:  Alert and oriented, normal mood and affect Cardiovascular:  Nl S1 and S2, RRR without murmur, gallop or rub. no edema Respiratory:  Good breath sounds bilaterally, CTAB with normal effort, no rales  Diabetic education:  ongoing education regarding chronic disease management for diabetes was given today. We continue to reinforce the ABC's of diabetic management: A1c (<7 or 8 dependent upon patient), tight blood pressure control, and cholesterol management with goal LDL < 100 minimally. We discuss diet strategies, exercise recommendations, medication options and possible side effects. At each visit, we review recommended immunizations and preventive care recommendations for diabetics and stress that good diabetic control can prevent other problems. See below for this patient's data.    Commons side effects, risks, benefits, and alternatives for medications and treatment plan prescribed today were discussed, and the patient expressed understanding of the given instructions. Patient is instructed to  call or message via MyChart if he/she has any questions or concerns regarding our treatment plan. No barriers to understanding were identified. We discussed Red Flag symptoms and signs in detail. Patient expressed understanding regarding what to do in case of urgent or emergency type symptoms.   Medication list was reconciled, printed and provided to the patient in AVS. Patient instructions and summary information was reviewed with the patient as documented in the AVS. This note was prepared with assistance of Dragon voice recognition software. Occasional wrong-word or sound-a-like substitutions may have occurred due to the inherent limitations of voice recognition software  This visit occurred during the SARS-CoV-2 public health emergency.  Safety protocols were in place, including screening questions prior to the visit, additional usage of staff PPE, and extensive cleaning of exam room while observing appropriate contact time as indicated for disinfecting solutions.

## 2020-06-27 NOTE — Patient Instructions (Signed)
Please return in 6 months for your annual complete physical; please come fasting.  It is always nice seeing you.   Keep up the good eating habits and get back to walking.  Start your flonase daily.  See if you still need the omeprazole.   If you have any questions or concerns, please don't hesitate to send me a message via MyChart or call the office at [phone]. Thank you for visiting with [place] today! It's our pleasure caring for you.

## 2020-06-29 ENCOUNTER — Ambulatory Visit: Payer: Medicare PPO | Admitting: Family Medicine

## 2020-07-25 ENCOUNTER — Other Ambulatory Visit (INDEPENDENT_AMBULATORY_CARE_PROVIDER_SITE_OTHER): Payer: Self-pay | Admitting: *Deleted

## 2020-07-25 DIAGNOSIS — Z800 Family history of malignant neoplasm of digestive organs: Secondary | ICD-10-CM

## 2020-07-25 DIAGNOSIS — Z86010 Personal history of colonic polyps: Secondary | ICD-10-CM

## 2020-08-21 ENCOUNTER — Telehealth (INDEPENDENT_AMBULATORY_CARE_PROVIDER_SITE_OTHER): Payer: Self-pay | Admitting: *Deleted

## 2020-08-21 ENCOUNTER — Encounter (INDEPENDENT_AMBULATORY_CARE_PROVIDER_SITE_OTHER): Payer: Self-pay | Admitting: *Deleted

## 2020-08-21 NOTE — Telephone Encounter (Signed)
Patient needs Sutab (copay card) ° °

## 2020-08-21 NOTE — Telephone Encounter (Signed)
Referring MD/PCP: andy   Procedure: tcs  Reason/Indication:  Hx polyps, fam hx colon ca  Has patient had this procedure before?  Yes, 2016  If so, when, by whom and where?    Is there a family history of colon cancer?  Yes, maternal uncle & aunt  Who?  What age when diagnosed?    Is patient diabetic?   no      Does patient have prosthetic heart valve or mechanical valve?  no  Do you have a pacemaker/defibrillator?  no  Has patient ever had endocarditis/atrial fibrillation? no  Does patient use oxygen? no  Has patient had joint replacement within last 12 months?  no  Is patient constipated or do they take laxatives? no  Does patient have a history of alcohol/drug use?  no  Is patient on blood thinner such as Coumadin, Plavix and/or Aspirin? no  Medications: allegra 180 mg daily, levothyroxine 100 mg daily, hyoscyamine 0.125 mg daily, omeprazole 20 mg daily  Allergies: nkda  Medication Adjustment per Dr Rehman/Dr Jenetta Downer   Procedure date & time: 09/20/20

## 2020-08-22 MED ORDER — SUTAB 1479-225-188 MG PO TABS
1.0000 | ORAL_TABLET | Freq: Once | ORAL | 0 refills | Status: AC
Start: 2020-08-22 — End: 2020-08-22

## 2020-08-23 ENCOUNTER — Other Ambulatory Visit (INDEPENDENT_AMBULATORY_CARE_PROVIDER_SITE_OTHER): Payer: Self-pay | Admitting: Gastroenterology

## 2020-08-23 DIAGNOSIS — K589 Irritable bowel syndrome without diarrhea: Secondary | ICD-10-CM

## 2020-08-29 ENCOUNTER — Other Ambulatory Visit: Payer: Self-pay

## 2020-08-29 ENCOUNTER — Ambulatory Visit
Admission: RE | Admit: 2020-08-29 | Discharge: 2020-08-29 | Disposition: A | Discharge status: Home / Self Care | Payer: Medicare PPO | Source: Ambulatory Visit | Attending: Family Medicine | Admitting: Family Medicine

## 2020-08-29 DIAGNOSIS — Z1231 Encounter for screening mammogram for malignant neoplasm of breast: Secondary | ICD-10-CM | POA: Diagnosis not present

## 2020-08-29 DIAGNOSIS — Z780 Asymptomatic menopausal state: Secondary | ICD-10-CM | POA: Diagnosis not present

## 2020-08-29 DIAGNOSIS — E2839 Other primary ovarian failure: Secondary | ICD-10-CM

## 2020-08-30 ENCOUNTER — Encounter: Payer: Self-pay | Admitting: Family Medicine

## 2020-08-30 DIAGNOSIS — Z1321 Encounter for screening for nutritional disorder: Secondary | ICD-10-CM | POA: Insufficient documentation

## 2020-08-30 DIAGNOSIS — Z712 Person consulting for explanation of examination or test findings: Secondary | ICD-10-CM | POA: Insufficient documentation

## 2020-09-13 NOTE — Patient Instructions (Addendum)
Shelly [name]  09/13/2020     @PREFPERIOPPHARMACY @   Your procedure is scheduled on  09/20/2020  Report to [facility] at  [facility]  Call this number if you have problems the morning of surgery:  [phone]   Remember:  Follow the diet and prep instructions given to you by the office.                      Take these medicines the morning of surgery with A SIP OF WATER  Allegra, levothyroxine, omeprazole. DO NOT take any medications for diabetes the morning of your procedure.    Do not wear jewelry, make-up or nail polish.  Do not wear lotions, powders, or perfumes. Please wear deodorant and brush your teeth.  Do not shave 48 hours prior to surgery.  Men may shave face and neck.  Do not bring valuables to the hospital.  [facility] is not responsible for any belongings or valuables.  Contacts, dentures or bridgework may not be worn into surgery.  Leave your suitcase in the car.  After surgery it may be brought to your room.  For patients admitted to the hospital, discharge time will be determined by your treatment team.  Patients discharged the day of surgery will not be allowed to drive home.   Name and phone number of your driver:   family Special instructions:  DO NOT smoke the morning of your procedure.  Please read over the following fact sheets that you were given. Anesthesia Post-op Instructions and Care and Recovery After Surgery       Colonoscopy, Adult, Care After This sheet gives you information about how to care for yourself after your procedure. Your health care provider may also give you more specific instructions. If you have problems or questions, contact your health care provider. What can I expect after the procedure? After the procedure, it is common to have:  A small amount of blood in your stool for 24 hours after the procedure.  Some gas.  Mild cramping or bloating of your abdomen. Follow these instructions at home: Eating  and drinking   Drink enough fluid to keep your urine pale yellow.  Follow instructions from your health care provider about eating or drinking restrictions.  Resume your normal diet as instructed by your health care provider. Avoid heavy or fried foods that are hard to digest. Activity  Rest as told by your health care provider.  Avoid sitting for a long time without moving. Get up to take short walks every 1-2 hours. This is important to improve blood flow and breathing. Ask for help if you feel weak or unsteady.  Return to your normal activities as told by your health care provider. Ask your health care provider what activities are safe for you. Managing cramping and bloating   Try walking around when you have cramps or feel bloated.  Apply heat to your abdomen as told by your health care provider. Use the heat source that your health care provider recommends, such as a moist heat pack or a heating pad. ? Place a towel between your skin and the heat source. ? Leave the heat on for 20-30 minutes. ? Remove the heat if your skin turns bright red. This is especially important if you are unable to feel pain, heat, or cold. You may have a greater risk of getting burned. General instructions  For the first 24 hours after the  procedure: ? Do not drive or use machinery. ? Do not sign important documents. ? Do not drink alcohol. ? Do your regular daily activities at a slower pace than normal. ? Eat soft foods that are easy to digest.  Take over-the-counter and prescription medicines only as told by your health care provider.  Keep all follow-up visits as told by your health care provider. This is important. Contact a health care provider if:  You have blood in your stool 2-3 days after the procedure. Get help right away if you have:  More than a small spotting of blood in your stool.  Large blood clots in your stool.  Swelling of your abdomen.  Nausea or vomiting.  A  fever.  Increasing pain in your abdomen that is not relieved with medicine. Summary  After the procedure, it is common to have a small amount of blood in your stool. You may also have mild cramping and bloating of your abdomen.  For the first 24 hours after the procedure, do not drive or use machinery, sign important documents, or drink alcohol.  Get help right away if you have a lot of blood in your stool, nausea or vomiting, a fever, or increased pain in your abdomen. This information is not intended to replace advice given to you by your health care provider. Make sure you discuss any questions you have with your health care provider. Document Revised: 06/21/2019 Document Reviewed: 06/21/2019 Elsevier Patient Education  [place] After These instructions provide you with information about caring for yourself after your procedure. Your health care provider may also give you more specific instructions. Your treatment has been planned according to current medical practices, but problems sometimes occur. Call your health care provider if you have any problems or questions after your procedure. What can I expect after the procedure? After your procedure, you may:  Feel sleepy for several hours.  Feel clumsy and have poor balance for several hours.  Feel forgetful about what happened after the procedure.  Have poor judgment for several hours.  Feel nauseous or vomit.  Have a sore throat if you had a breathing tube during the procedure. Follow these instructions at home: For at least 24 hours after the procedure:      Have a responsible adult stay with you. It is important to have someone help care for you until you are awake and alert.  Rest as needed.  Do not: ? Participate in activities in which you could fall or become injured. ? Drive. ? Use heavy machinery. ? Drink alcohol. ? Take sleeping pills or medicines that cause  drowsiness. ? Make important decisions or sign legal documents. ? Take care of children on your own. Eating and drinking  Follow the diet that is recommended by your health care provider.  If you vomit, drink water, juice, or soup when you can drink without vomiting.  Make sure you have little or no nausea before eating solid foods. General instructions  Take over-the-counter and prescription medicines only as told by your health care provider.  If you have sleep apnea, surgery and certain medicines can increase your risk for breathing problems. Follow instructions from your health care provider about wearing your sleep device: ? Anytime you are sleeping, including during daytime naps. ? While taking prescription pain medicines, sleeping medicines, or medicines that make you drowsy.  If you smoke, do not smoke without supervision.  Keep all follow-up visits as told by your  health care provider. This is important. Contact a health care provider if:  You keep feeling nauseous or you keep vomiting.  You feel light-headed.  You develop a rash.  You have a fever. Get help right away if:  You have trouble breathing. Summary  For several hours after your procedure, you may feel sleepy and have poor judgment.  Have a responsible adult stay with you for at least 24 hours or until you are awake and alert. This information is not intended to replace advice given to you by your health care provider. Make sure you discuss any questions you have with your health care provider. Document Revised: 02/23/2018 Document Reviewed: 03/17/2016 Elsevier Patient Education  [place].

## 2020-09-18 ENCOUNTER — Encounter (HOSPITAL_COMMUNITY)
Admission: RE | Admit: 2020-09-18 | Discharge: 2020-09-18 | Disposition: A | Discharge status: Home / Self Care | Payer: Medicare PPO | Source: Ambulatory Visit | Attending: Internal Medicine | Admitting: Internal Medicine

## 2020-09-18 ENCOUNTER — Encounter (HOSPITAL_COMMUNITY): Payer: Self-pay

## 2020-09-18 ENCOUNTER — Other Ambulatory Visit: Payer: Self-pay

## 2020-09-18 ENCOUNTER — Other Ambulatory Visit (HOSPITAL_COMMUNITY)
Admission: RE | Admit: 2020-09-18 | Discharge: 2020-09-18 | Disposition: A | Discharge status: Home / Self Care | Payer: Medicare PPO | Source: Ambulatory Visit | Attending: Internal Medicine | Admitting: Internal Medicine

## 2020-09-18 DIAGNOSIS — Z20822 Contact with and (suspected) exposure to covid-19: Secondary | ICD-10-CM | POA: Diagnosis not present

## 2020-09-18 DIAGNOSIS — Z01812 Encounter for preprocedural laboratory examination: Secondary | ICD-10-CM | POA: Insufficient documentation

## 2020-09-18 LAB — SARS CORONAVIRUS 2 (TAT 6-24 HRS): SARS Coronavirus 2: NEGATIVE

## 2020-09-18 LAB — CBC WITH DIFFERENTIAL/PLATELET
Abs Immature Granulocytes: 0.02 10*3/uL (ref 0.00–0.07)
Basophils Absolute: 0.1 10*3/uL (ref 0.0–0.1)
Basophils Relative: 1 %
Eosinophils Absolute: 0.2 10*3/uL (ref 0.0–0.5)
Eosinophils Relative: 4 %
HCT: 40 % (ref 36.0–46.0)
Hemoglobin: 12.8 g/dL (ref 12.0–15.0)
Immature Granulocytes: 0 %
Lymphocytes Relative: 37 %
Lymphs Abs: 2.3 10*3/uL (ref 0.7–4.0)
MCH: 26.3 pg (ref 26.0–34.0)
MCHC: 32 g/dL (ref 30.0–36.0)
MCV: 82.3 fL (ref 80.0–100.0)
Monocytes Absolute: 0.6 10*3/uL (ref 0.1–1.0)
Monocytes Relative: 9 %
Neutro Abs: 3.1 10*3/uL (ref 1.7–7.7)
Neutrophils Relative %: 49 %
Platelets: 146 10*3/uL — ABNORMAL LOW (ref 150–400)
RBC: 4.86 MIL/uL (ref 3.87–5.11)
RDW: 15.9 % — ABNORMAL HIGH (ref 11.5–15.5)
WBC: 6.4 10*3/uL (ref 4.0–10.5)
nRBC: 0 % (ref 0.0–0.2)

## 2020-09-18 LAB — BASIC METABOLIC PANEL
Anion gap: 8 (ref 5–15)
BUN: 19 mg/dL (ref 8–23)
CO2: 21 mmol/L — ABNORMAL LOW (ref 22–32)
Calcium: 8.6 mg/dL — ABNORMAL LOW (ref 8.9–10.3)
Chloride: 108 mmol/L (ref 98–111)
Creatinine, Ser: 0.7 mg/dL (ref 0.44–1.00)
GFR, Estimated: >60 mL/min (ref >60)
Glucose, Bld: 153 mg/dL — ABNORMAL HIGH (ref 70–99)
Potassium: 3.8 mmol/L (ref 3.5–5.1)
Sodium: 137 mmol/L (ref 135–145)

## 2020-09-20 ENCOUNTER — Ambulatory Visit (HOSPITAL_COMMUNITY): Payer: Medicare PPO | Admitting: Anesthesiology

## 2020-09-20 ENCOUNTER — Other Ambulatory Visit (INDEPENDENT_AMBULATORY_CARE_PROVIDER_SITE_OTHER): Payer: Self-pay | Admitting: *Deleted

## 2020-09-20 ENCOUNTER — Encounter (HOSPITAL_COMMUNITY): Payer: Self-pay | Admitting: Internal Medicine

## 2020-09-20 ENCOUNTER — Encounter (HOSPITAL_COMMUNITY)
Admission: RE | Disposition: A | Discharge status: Home / Self Care | Payer: Self-pay | Source: Home / Self Care | Attending: Internal Medicine

## 2020-09-20 ENCOUNTER — Ambulatory Visit (HOSPITAL_COMMUNITY)
Admission: RE | Admit: 2020-09-20 | Discharge: 2020-09-20 | Disposition: A | Discharge status: Home / Self Care | Payer: Medicare PPO | Attending: Internal Medicine | Admitting: Internal Medicine

## 2020-09-20 ENCOUNTER — Other Ambulatory Visit: Payer: Self-pay

## 2020-09-20 DIAGNOSIS — Z79899 Other long term (current) drug therapy: Secondary | ICD-10-CM | POA: Diagnosis not present

## 2020-09-20 DIAGNOSIS — Z79890 Hormone replacement therapy (postmenopausal): Secondary | ICD-10-CM | POA: Diagnosis not present

## 2020-09-20 DIAGNOSIS — Z85828 Personal history of other malignant neoplasm of skin: Secondary | ICD-10-CM | POA: Diagnosis not present

## 2020-09-20 DIAGNOSIS — E039 Hypothyroidism, unspecified: Secondary | ICD-10-CM | POA: Insufficient documentation

## 2020-09-20 DIAGNOSIS — K6289 Other specified diseases of anus and rectum: Secondary | ICD-10-CM | POA: Diagnosis not present

## 2020-09-20 DIAGNOSIS — K644 Residual hemorrhoidal skin tags: Secondary | ICD-10-CM | POA: Diagnosis not present

## 2020-09-20 DIAGNOSIS — E119 Type 2 diabetes mellitus without complications: Secondary | ICD-10-CM | POA: Diagnosis not present

## 2020-09-20 DIAGNOSIS — I10 Essential (primary) hypertension: Secondary | ICD-10-CM | POA: Insufficient documentation

## 2020-09-20 DIAGNOSIS — K589 Irritable bowel syndrome without diarrhea: Secondary | ICD-10-CM | POA: Insufficient documentation

## 2020-09-20 DIAGNOSIS — K760 Fatty (change of) liver, not elsewhere classified: Secondary | ICD-10-CM

## 2020-09-20 DIAGNOSIS — K219 Gastro-esophageal reflux disease without esophagitis: Secondary | ICD-10-CM | POA: Diagnosis not present

## 2020-09-20 DIAGNOSIS — Z800 Family history of malignant neoplasm of digestive organs: Secondary | ICD-10-CM | POA: Insufficient documentation

## 2020-09-20 DIAGNOSIS — Z86010 Personal history of colonic polyps: Secondary | ICD-10-CM | POA: Insufficient documentation

## 2020-09-20 DIAGNOSIS — Z1211 Encounter for screening for malignant neoplasm of colon: Secondary | ICD-10-CM | POA: Diagnosis not present

## 2020-09-20 DIAGNOSIS — Z09 Encounter for follow-up examination after completed treatment for conditions other than malignant neoplasm: Secondary | ICD-10-CM | POA: Diagnosis not present

## 2020-09-20 HISTORY — PX: COLONOSCOPY WITH PROPOFOL: SHX5780

## 2020-09-20 LAB — GLUCOSE, CAPILLARY
Glucose-Capillary: 114 mg/dL — ABNORMAL HIGH (ref 70–99)
Glucose-Capillary: 106 mg/dL — ABNORMAL HIGH (ref 70–99)

## 2020-09-20 LAB — HM COLONOSCOPY

## 2020-09-20 SURGERY — COLONOSCOPY WITH PROPOFOL
Anesthesia: General

## 2020-09-20 MED ORDER — CHLORHEXIDINE GLUCONATE CLOTH 2 % EX PADS: 6.0000 | MEDICATED_PAD | Freq: Once | CUTANEOUS | Status: DC

## 2020-09-20 MED ORDER — LACTATED RINGERS IV SOLN: INTRAVENOUS | Status: DC | PRN

## 2020-09-20 MED ORDER — PROPOFOL 10 MG/ML IV BOLUS
INTRAVENOUS | Status: AC
  Filled 2020-09-20: qty 40

## 2020-09-20 MED ORDER — PROPOFOL 500 MG/50ML IV EMUL
INTRAVENOUS | Status: DC | PRN
  Administered 2020-09-20: 150 ug/kg/min via INTRAVENOUS

## 2020-09-20 MED ORDER — PROPOFOL 10 MG/ML IV BOLUS
INTRAVENOUS | Status: DC | PRN
  Administered 2020-09-20: 50 mg via INTRAVENOUS
  Administered 2020-09-20 (×2): 20 mg via INTRAVENOUS
  Administered 2020-09-20: 30 mg via INTRAVENOUS

## 2020-09-20 MED ORDER — LACTATED RINGERS IV SOLN: Freq: Once | INTRAVENOUS | Status: AC

## 2020-09-20 NOTE — Anesthesia Preprocedure Evaluation (Signed)
Anesthesia Evaluation  Patient identified by MRN, date of birth, ID band Patient awake    Reviewed: Allergy & Precautions, NPO status , Patient's Chart, lab work & pertinent test results  History of Anesthesia Complications Negative for: history of anesthetic complications  Airway Mallampati: II  TM Distance: >3 FB Neck ROM: Full    Dental  (+) Dental Advisory Given Crowns :   Pulmonary neg pulmonary ROS,    Pulmonary exam normal breath sounds clear to auscultation       Cardiovascular Exercise Tolerance: Good hypertension, Pt. on medications Normal cardiovascular exam Rhythm:Regular Rate:Normal     Neuro/Psych  Headaches,    GI/Hepatic Neg liver ROS, GERD  Medicated,  Endo/Other  diabetes, Well Controlled, Type 2Hypothyroidism   Renal/GU negative Renal ROS     Musculoskeletal  (+) Arthritis ,   Abdominal   Peds  Hematology   Anesthesia Other Findings   Reproductive/Obstetrics                            Anesthesia Physical Anesthesia Plan  ASA: II  Anesthesia Plan: General   Post-op Pain Management:    Induction: Intravenous  PONV Risk Score and Plan: TIVA  Airway Management Planned: Nasal Cannula, Natural Airway and Simple Face Mask  Additional Equipment:   Intra-op Plan:   Post-operative Plan:   Informed Consent: I have reviewed the patients History and Physical, chart, labs and discussed the procedure including the risks, benefits and alternatives for the proposed anesthesia with the patient or authorized representative who has indicated his/her understanding and acceptance.       Plan Discussed with: CRNA and Surgeon  Anesthesia Plan Comments:        Anesthesia Quick Evaluation

## 2020-09-20 NOTE — Op Note (Signed)
[facility] Patient Name: Shelly [name] Procedure Date: 09/20/2020 9:24 AM MRN: [id number] Date of Birth: 03/14/1956 Attending MD: Hildred Laser , MD CSN: [id number] Age: 64 Admit Type: Outpatient Procedure:                Colonoscopy Indications:              High risk colon cancer surveillance: Personal                            history of colonic polyps Providers:                Hildred Laser, MD, Otis Peak B. Sharon Seller, RN, Dereck Leep, Technician, Aram Candela Referring MD:             [place] Jonni Sanger MD Medicines:                Propofol per Anesthesia Complications:            No immediate complications. Estimated Blood Loss:     Estimated blood loss: none. Procedure:                Pre-Anesthesia Assessment:                           - Prior to the procedure, a History and Physical                            was performed, and patient medications and                            allergies were reviewed. The patient's tolerance of                            previous anesthesia was also reviewed. The risks                            and benefits of the procedure and the sedation                            options and risks were discussed with the patient.                            All questions were answered, and informed consent                            was obtained. Prior Anticoagulants: The patient has                            taken no previous anticoagulant or antiplatelet                            agents except for NSAID medication. ASA Grade  Assessment: II - A patient with mild systemic                            disease. After reviewing the risks and benefits,                            the patient was deemed in satisfactory condition to                            undergo the procedure.                           After obtaining informed consent, the colonoscope                            was passed  under direct vision. Throughout the                            procedure, the patient's blood pressure, pulse, and                            oxygen saturations were monitored continuously. The                            [id number]) was introduced through the anus                            and advanced to the the cecum, identified by                            appendiceal orifice and ileocecal valve. The                            colonoscopy was performed without difficulty. The                            patient tolerated the procedure well. The quality                            of the bowel preparation was excellent. The                            ileocecal valve, appendiceal orifice, and rectum                            were photographed. Scope In: 9:52:54 AM Scope Out: 10:10:29 AM Scope Withdrawal Time: 0 hours 9 minutes 19 seconds  Total Procedure Duration: 0 hours 17 minutes 35 seconds  Findings:      The perianal and digital rectal examinations were normal.      The colon (entire examined portion) appeared normal.      External hemorrhoids were found during retroflexion. The hemorrhoids       were small.      Anal papilla(e) were hypertrophied. Impression:               -  The entire examined colon is normal.                           - External hemorrhoids.                           - Anal papilla(e) were hypertrophied.                           - No specimens collected. Moderate Sedation:      Per Anesthesia Care Recommendation:           - Patient has a contact number available for                            emergencies. The signs and symptoms of potential                            delayed complications were discussed with the                            patient. Return to normal activities tomorrow.                            Written discharge instructions were provided to the                            patient.                           - Resume previous diet  today.                           - Continue present medications.                           - Repeat colonoscopy in 5 years for surveillance. Procedure Code(s):        --- Professional ---                           [phone], Colonoscopy, flexible; diagnostic, including                            collection of specimen(s) by brushing or washing,                            when performed (separate procedure) Diagnosis Code(s):        --- Professional ---                           Z86.010, Personal history of colonic polyps                           K64.4, Residual hemorrhoidal skin tags                           K62.89, Other specified diseases of anus and rectum  CPT copyright 2019 American Medical Association. All rights reserved. The codes documented in this report are preliminary and upon coder review may  be revised to meet current compliance requirements. Hildred Laser, MD Hildred Laser, MD 09/20/2020 10:18:16 AM This report has been signed electronically. Number of Addenda: 0

## 2020-09-20 NOTE — Transfer of Care (Signed)
Immediate Anesthesia Transfer of Care Note  Patient: Shelly [name]  Procedure(s) Performed: COLONOSCOPY WITH PROPOFOL (N/A )  Patient Location: PACU  Anesthesia Type:General  Level of Consciousness: awake  Airway & Oxygen Therapy: Patient Spontanous Breathing  Post-op Assessment: Report given to RN  Post vital signs: Reviewed and stable  Last Vitals:  Vitals Value Taken Time  BP 132/62 09/20/20 1015  Temp    Pulse 76 09/20/20 1017  Resp 21 09/20/20 1017  SpO2 97 % 09/20/20 1017  Vitals shown include unvalidated device data.  Last Pain:  Vitals:   09/20/20 0944  TempSrc:   PainSc: 0-No pain         Complications: No complications documented.

## 2020-09-20 NOTE — Anesthesia Postprocedure Evaluation (Signed)
Anesthesia Post Note  Patient: Shelly [name]  Procedure(s) Performed: COLONOSCOPY WITH PROPOFOL (N/A )  Patient location during evaluation: PACU Anesthesia Type: General Level of consciousness: awake and alert Pain management: pain level controlled Vital Signs Assessment: post-procedure vital signs reviewed and stable Respiratory status: spontaneous breathing Cardiovascular status: blood pressure returned to baseline and stable Postop Assessment: no apparent nausea or vomiting Anesthetic complications: no   No complications documented.   Last Vitals:  Vitals:   09/20/20 0909 09/20/20 1015  BP: 137/73 132/62  Pulse: 84 82  Resp: 17 (!) 24  Temp: 36.8 C (P) 36.5 C  SpO2: 94% 98%    Last Pain:  Vitals:   09/20/20 0944  TempSrc:   PainSc: 0-No pain                 Nikkolas Coomes

## 2020-09-20 NOTE — Discharge Instructions (Signed)
Resume usual medications and diet as before. No driving for 24 hours. Next colonoscopy in 5 years.  Abdominal ultrasound to be scheduled.  Office will call you.   ***Lacretia Nicks Office Coordinator notified via telephone, office will call with information****       Colonoscopy, Adult, Care After This sheet gives you information about how to care for yourself after your procedure. Your doctor may also give you more specific instructions. If you have problems or questions, call your doctor. What can I expect after the procedure? After the procedure, it is common to have:  A small amount of blood in your poop (stool) for 24 hours.  Some gas.  Mild cramping or bloating in your belly (abdomen). Follow these instructions at home: Eating and drinking   Drink enough fluid to keep your pee (urine) pale yellow.  Follow instructions from your doctor about what you cannot eat or drink.  Return to your normal diet as told by your doctor. Avoid heavy or fried foods that are hard to digest. Activity  Rest as told by your doctor.  Do not sit for a long time without moving. Get up to take short walks every 1-2 hours. This is important. Ask for help if you feel weak or unsteady.  Return to your normal activities as told by your doctor. Ask your doctor what activities are safe for you. To help cramping and bloating:   Try walking around.  Put heat on your belly as told by your doctor. Use the heat source that your doctor recommends, such as a moist heat pack or a heating pad. ? Put a towel between your skin and the heat source. ? Leave the heat on for 20-30 minutes. ? Remove the heat if your skin turns bright red. This is very important if you are unable to feel pain, heat, or cold. You may have a greater risk of getting burned. General instructions  For the first 24 hours after the procedure: ? Do not drive or use machinery. ? Do not sign important documents. ? Do not drink  alcohol. ? Do your daily activities more slowly than normal. ? Eat foods that are soft and easy to digest.  Take over-the-counter or prescription medicines only as told by your doctor.  Keep all follow-up visits as told by your doctor. This is important. Contact a doctor if:  You have blood in your poop 2-3 days after the procedure. Get help right away if:  You have more than a small amount of blood in your poop.  You see large clumps of tissue (blood clots) in your poop.  Your belly is swollen.  You feel like you may vomit (nauseous).  You vomit.  You have a fever.  You have belly pain that gets worse, and medicine does not help your pain. Summary  After the procedure, it is common to have a small amount of blood in your poop. You may also have mild cramping and bloating in your belly.  For the first 24 hours after the procedure, do not drive or use machinery, do not sign important documents, and do not drink alcohol.  Get help right away if you have a lot of blood in your poop, feel like you may vomit, have a fever, or have more belly pain. This information is not intended to replace advice given to you by your health care provider. Make sure you discuss any questions you have with your health care provider. Document Revised: 06/21/2019 Document Reviewed:  06/21/2019 Elsevier Patient Education  2020 [place] After These instructions provide you with information about caring for yourself after your procedure. Your health care provider may also give you more specific instructions. Your treatment has been planned according to current medical practices, but problems sometimes occur. Call your health care provider if you have any problems or questions after your procedure. What can I expect after the procedure? After your procedure, you may:  Feel sleepy for several hours.  Feel clumsy and have poor balance for several hours.  Feel  forgetful about what happened after the procedure.  Have poor judgment for several hours.  Feel nauseous or vomit.  Have a sore throat if you had a breathing tube during the procedure. Follow these instructions at home: For at least 24 hours after the procedure:      Have a responsible adult stay with you. It is important to have someone help care for you until you are awake and alert.  Rest as needed.  Do not: ? Participate in activities in which you could fall or become injured. ? Drive. ? Use heavy machinery. ? Drink alcohol. ? Take sleeping pills or medicines that cause drowsiness. ? Make important decisions or sign legal documents. ? Take care of children on your own. Eating and drinking  Follow the diet that is recommended by your health care provider.  If you vomit, drink water, juice, or soup when you can drink without vomiting.  Make sure you have little or no nausea before eating solid foods. General instructions  Take over-the-counter and prescription medicines only as told by your health care provider.  If you have sleep apnea, surgery and certain medicines can increase your risk for breathing problems. Follow instructions from your health care provider about wearing your sleep device: ? Anytime you are sleeping, including during daytime naps. ? While taking prescription pain medicines, sleeping medicines, or medicines that make you drowsy.  If you smoke, do not smoke without supervision.  Keep all follow-up visits as told by your health care provider. This is important. Contact a health care provider if:  You keep feeling nauseous or you keep vomiting.  You feel light-headed.  You develop a rash.  You have a fever. Get help right away if:  You have trouble breathing. Summary  For several hours after your procedure, you may feel sleepy and have poor judgment.  Have a responsible adult stay with you for at least 24 hours or until you are awake and  alert. This information is not intended to replace advice given to you by your health care provider. Make sure you discuss any questions you have with your health care provider. Document Revised: 02/23/2018 Document Reviewed: 03/17/2016 Elsevier Patient Education  [place].

## 2020-09-20 NOTE — H&P (Signed)
Shelly [name] is an 64 y.o. female.   Chief Complaint: Patient is here for colonoscopy. HPI: Patient is 64 year old Caucasian female who is here for surveillance colonoscopy.  She had colonoscopy in July 2016 with removal of a small polyp from transverse colon and was tubular adenoma.  She denies abdominal pain or change in her bowel habits.  Her last colonoscopy was in July 2016. Family history significant for CRC in paternal aunt and uncle who are possibly in their 87s.  She has 2 siblings without history of polyps or colon carcinoma. Patient's preop labs revealed thrombocytopenia.  She has history of fatty liver for at least 10 years.  She will be further evaluated to rule out cirrhosis.  Past Medical History:  Diagnosis Date  . Cancer (HCC)    Basal Cell   . Diabetes mellitus without complication ([place])   . GERD (gastroesophageal reflux disease)   . Headache(784.0)   . Hypothyroidism   . Irritable bowel syndrome (IBS)   . Primary osteoarthritis of left knee 01/05/2019  . Thrombopenia ([place]) 01/07/2019   Chronic by lab review    Past Surgical History:  Procedure Laterality Date  . ABDOMINAL HYSTERECTOMY    . APPENDECTOMY  1991  . COLONOSCOPY N/A 06/19/2015   Procedure: COLONOSCOPY;  Surgeon: Rogene Houston, MD;  Location[facility];  Service: Endoscopy;  Laterality: N/A;  8:30-9:30  . cyst removed     right arm  . FLEXIBLE SIGMOIDOSCOPY  07/23/2011   Procedure: FLEXIBLE SIGMOIDOSCOPY;  Surgeon: Rogene Houston, MD;  Location: [facility];  Service: Endoscopy;  Laterality: N/A;  . TONSILLECTOMY AND ADENOIDECTOMY      Family History  Problem Relation Age of Onset  . Breast cancer Mother 52  . Atrial fibrillation Mother 66       s/p ablation  . Breast cancer Maternal Grandmother 80  . Colon cancer Maternal Aunt 80  . Colon cancer Maternal Uncle 31   Social History:  reports that she has never smoked. She has never used smokeless tobacco. She reports current  alcohol use. She reports that she does not use drugs.  Allergies:  Allergies  Allergen Reactions  . Propoxyphene N-Acetaminophen Hives and Palpitations    Medications Prior to Admission  Medication Sig Dispense Refill  . Artificial Tear Solution (SOOTHE XP OP) Place 1 drop into both eyes daily as needed (dry eye).     . fexofenadine (ALLEGRA) 180 MG tablet Take 180 mg by mouth daily.    . hyoscyamine (LEVSIN SL) 0.125 MG SL tablet TAKE 1 TABLET SUBLINGUALLY ONCE DAILY (Patient taking differently: Take 0.125 mg by mouth daily. ) 90 tablet 0  . ibuprofen (ADVIL) 200 MG tablet Take 400 mg by mouth every 6 (six) hours as needed for headache.    . levothyroxine (SYNTHROID) 100 MCG tablet Take 1 tablet (100 mcg total) by mouth daily before breakfast. 90 tablet 3  . omeprazole (PRILOSEC) 20 MG capsule Take 1 capsule (20 mg total) by mouth daily. 90 capsule 3  . benzonatate (TESSALON) 100 MG capsule Take 1 capsule (100 mg total) by mouth 2 (two) times daily as needed for cough. (Patient not taking: Reported on 06/27/2020) 20 capsule 0    Results for orders placed or performed during the hospital encounter of 09/20/20 (from the past 48 hour(s))  Glucose, capillary     Status: Abnormal   Collection Time: 09/20/20  8:56 AM  Result Value Ref Range   Glucose-Capillary 114 (H) 70 -  99 mg/dL    Comment: Glucose reference range applies only to samples taken after fasting for at least 8 hours.   No results found.  Review of Systems  Blood pressure 137/73, pulse 84, temperature 98.2 F (36.8 C), temperature source Oral, resp. rate 17, height 5\' 9"  (1.753 m), weight 113.4 kg, SpO2 94 %. Physical Exam HENT:     Mouth/Throat:     Mouth: Mucous membranes are moist.     Pharynx: Oropharynx is clear.  Eyes:     General: No scleral icterus.    Conjunctiva/sclera: Conjunctivae normal.  Cardiovascular:     Rate and Rhythm: Normal rate and regular rhythm.     Heart sounds: Normal heart sounds. No murmur  heard.   Pulmonary:     Effort: Pulmonary effort is normal.     Breath sounds: Normal breath sounds.  Abdominal:     General: There is no distension.     Palpations: Abdomen is soft. There is no mass.     Tenderness: There is no abdominal tenderness.  Musculoskeletal:     Cervical back: Neck supple.  Neurological:     Mental Status: She is alert.      Assessment/Plan History of colonic adenoma. History of colon carcinoma in 2 second-degree relatives. Surveillance colonoscopy.  Hildred Laser, MD 09/20/2020, 9:42 AM

## 2020-09-22 ENCOUNTER — Encounter (INDEPENDENT_AMBULATORY_CARE_PROVIDER_SITE_OTHER): Payer: Self-pay | Admitting: *Deleted

## 2020-09-25 ENCOUNTER — Ambulatory Visit (HOSPITAL_COMMUNITY)
Admission: RE | Admit: 2020-09-25 | Discharge: 2020-09-25 | Disposition: A | Discharge status: Home / Self Care | Payer: Medicare PPO | Source: Ambulatory Visit | Attending: Internal Medicine | Admitting: Internal Medicine

## 2020-09-25 ENCOUNTER — Encounter (HOSPITAL_COMMUNITY): Payer: Self-pay | Admitting: Internal Medicine

## 2020-09-25 ENCOUNTER — Other Ambulatory Visit: Payer: Self-pay

## 2020-09-25 DIAGNOSIS — K760 Fatty (change of) liver, not elsewhere classified: Secondary | ICD-10-CM | POA: Diagnosis not present

## 2020-09-25 DIAGNOSIS — K828 Other specified diseases of gallbladder: Secondary | ICD-10-CM | POA: Diagnosis not present

## 2020-09-27 ENCOUNTER — Other Ambulatory Visit (INDEPENDENT_AMBULATORY_CARE_PROVIDER_SITE_OTHER): Payer: Self-pay | Admitting: Internal Medicine

## 2020-09-27 DIAGNOSIS — K589 Irritable bowel syndrome without diarrhea: Secondary | ICD-10-CM

## 2020-09-27 MED ORDER — HYOSCYAMINE SULFATE 0.125 MG SL SUBL: 0.1250 mg | SUBLINGUAL_TABLET | Freq: Three times a day (TID) | SUBLINGUAL | 2 refills | Status: DC | PRN

## 2020-11-15 ENCOUNTER — Telehealth: Payer: Self-pay | Admitting: Family Medicine

## 2020-11-15 NOTE — Telephone Encounter (Signed)
Left message for patient to call back and schedule Medicare Annual Wellness Visit (AWV) either virtually OR in office.   WELCOME EXAM WAS 10/13/2019; please schedule at anytime with [facility]-Nurse Health Advisor at [facility].  This should be a 45 minute visit.

## 2020-12-28 ENCOUNTER — Encounter: Payer: Medicare PPO | Admitting: Family Medicine

## 2021-01-05 ENCOUNTER — Other Ambulatory Visit: Payer: Self-pay | Admitting: Family Medicine

## 2021-01-05 ENCOUNTER — Telehealth: Payer: Self-pay

## 2021-01-05 DIAGNOSIS — E039 Hypothyroidism, unspecified: Secondary | ICD-10-CM

## 2021-01-05 NOTE — Telephone Encounter (Signed)
..   LAST APPOINTMENT DATE: 07 20 2021  NEXT APPOINTMENT DATE:@4 /04/2021  MEDICATION:levothyroxine (SYNTHROID) 100 MCG tablet    PHARMACY:  Let patient know to contact pharmacy at the end of the day to make sure medication is ready.  Please notify patient to allow 48-72 hours to process  Encourage patient to contact the pharmacy for refills or they can request refills through [place]:   LAST REFILL:  QTY:  REFILL DATE:    OTHER COMMENTS:    Okay for refill?  Please advise

## 2021-01-08 ENCOUNTER — Other Ambulatory Visit: Payer: Self-pay

## 2021-01-08 DIAGNOSIS — E039 Hypothyroidism, unspecified: Secondary | ICD-10-CM

## 2021-01-08 MED ORDER — LEVOTHYROXINE SODIUM 100 MCG PO TABS: ORAL_TABLET | ORAL | 0 refills | Status: DC

## 2021-01-08 NOTE — Telephone Encounter (Signed)
3 month refill sent to pharmacy  

## 2021-01-17 ENCOUNTER — Other Ambulatory Visit: Payer: Self-pay | Admitting: Family Medicine

## 2021-01-17 DIAGNOSIS — K219 Gastro-esophageal reflux disease without esophagitis: Secondary | ICD-10-CM

## 2021-03-13 ENCOUNTER — Other Ambulatory Visit: Payer: Self-pay

## 2021-03-13 ENCOUNTER — Telehealth: Payer: Self-pay

## 2021-03-13 ENCOUNTER — Encounter: Payer: Self-pay | Admitting: Family Medicine

## 2021-03-13 ENCOUNTER — Ambulatory Visit (INDEPENDENT_AMBULATORY_CARE_PROVIDER_SITE_OTHER): Payer: Medicare PPO | Admitting: Family Medicine

## 2021-03-13 VITALS — BP 124/82 | HR 72 | Temp 98.2°F | Resp 16 | Ht 68.0 in | Wt 255.2 lb

## 2021-03-13 DIAGNOSIS — R7301 Impaired fasting glucose: Secondary | ICD-10-CM

## 2021-03-13 DIAGNOSIS — R030 Elevated blood-pressure reading, without diagnosis of hypertension: Secondary | ICD-10-CM | POA: Diagnosis not present

## 2021-03-13 DIAGNOSIS — K589 Irritable bowel syndrome without diarrhea: Secondary | ICD-10-CM | POA: Diagnosis not present

## 2021-03-13 DIAGNOSIS — Z0000 Encounter for general adult medical examination without abnormal findings: Secondary | ICD-10-CM

## 2021-03-13 DIAGNOSIS — K760 Fatty (change of) liver, not elsewhere classified: Secondary | ICD-10-CM | POA: Diagnosis not present

## 2021-03-13 DIAGNOSIS — K219 Gastro-esophageal reflux disease without esophagitis: Secondary | ICD-10-CM

## 2021-03-13 DIAGNOSIS — E039 Hypothyroidism, unspecified: Secondary | ICD-10-CM

## 2021-03-13 DIAGNOSIS — D696 Thrombocytopenia, unspecified: Secondary | ICD-10-CM | POA: Diagnosis not present

## 2021-03-13 DIAGNOSIS — M1712 Unilateral primary osteoarthritis, left knee: Secondary | ICD-10-CM | POA: Diagnosis not present

## 2021-03-13 DIAGNOSIS — Z6835 Body mass index (BMI) 35.0-35.9, adult: Secondary | ICD-10-CM

## 2021-03-13 LAB — LIPID PANEL
Cholesterol: 128 mg/dL (ref 0–200)
HDL: 61.4 mg/dL (ref >39.00)
LDL Cholesterol: 48 mg/dL (ref 0–99)
NonHDL: 66.76
Total CHOL/HDL Ratio: 2
Triglycerides: 95 mg/dL (ref 0.0–149.0)
VLDL: 19 mg/dL (ref 0.0–40.0)

## 2021-03-13 LAB — CBC WITH DIFFERENTIAL/PLATELET
Basophils Absolute: 0.1 10*3/uL (ref 0.0–0.1)
Basophils Relative: 0.9 % (ref 0.0–3.0)
Eosinophils Absolute: 0.2 10*3/uL (ref 0.0–0.7)
Eosinophils Relative: 2.9 % (ref 0.0–5.0)
HCT: 39.4 % (ref 36.0–46.0)
Hemoglobin: 13 g/dL (ref 12.0–15.0)
Lymphocytes Relative: 34.6 % (ref 12.0–46.0)
Lymphs Abs: 2 10*3/uL (ref 0.7–4.0)
MCHC: 33.1 g/dL (ref 30.0–36.0)
MCV: 79.3 fl (ref 78.0–100.0)
Monocytes Absolute: 0.6 10*3/uL (ref 0.1–1.0)
Monocytes Relative: 10.7 % (ref 3.0–12.0)
Neutro Abs: 3 10*3/uL (ref 1.4–7.7)
Neutrophils Relative %: 50.9 % (ref 43.0–77.0)
Platelets: 150 10*3/uL (ref 150.0–400.0)
RBC: 4.97 Mil/uL (ref 3.87–5.11)
RDW: 17 % — ABNORMAL HIGH (ref 11.5–15.5)
WBC: 5.8 10*3/uL (ref 4.0–10.5)

## 2021-03-13 LAB — COMPREHENSIVE METABOLIC PANEL
ALT: 41 U/L — ABNORMAL HIGH (ref 0–35)
AST: 35 U/L (ref 0–37)
Albumin: 4 g/dL (ref 3.5–5.2)
Alkaline Phosphatase: 80 U/L (ref 39–117)
BUN: 13 mg/dL (ref 6–23)
CO2: 25 mEq/L (ref 19–32)
Calcium: 8.9 mg/dL (ref 8.4–10.5)
Chloride: 106 mEq/L (ref 96–112)
Creatinine, Ser: 0.82 mg/dL (ref 0.40–1.20)
GFR: 74.15 mL/min (ref >60.00)
Glucose, Bld: 118 mg/dL — ABNORMAL HIGH (ref 70–99)
Potassium: 4.2 mEq/L (ref 3.5–5.1)
Sodium: 139 mEq/L (ref 135–145)
Total Bilirubin: 0.6 mg/dL (ref 0.2–1.2)
Total Protein: 6.5 g/dL (ref 6.0–8.3)

## 2021-03-13 LAB — TSH: TSH: 2.93 u[IU]/mL (ref 0.35–4.50)

## 2021-03-13 LAB — HEMOGLOBIN A1C: Hgb A1c MFr Bld: 6.5 % (ref 4.6–6.5)

## 2021-03-13 MED ORDER — OMEPRAZOLE 20 MG PO CPDR: 1.0000 | DELAYED_RELEASE_CAPSULE | Freq: Every day | ORAL | 3 refills | Status: DC

## 2021-03-13 MED ORDER — LEVOTHYROXINE SODIUM 100 MCG PO TABS: ORAL_TABLET | ORAL | 3 refills | Status: DC

## 2021-03-13 NOTE — Telephone Encounter (Signed)
error 

## 2021-03-13 NOTE — Progress Notes (Signed)
Subjective  Chief Complaint  Patient presents with  . Annual Exam    Fasting     HPI: Shelly [name] is a 65 y.o. female who presents to [facility] today for a Female Wellness Visit. She also has the concerns and/or needs as listed above in the chief complaint. These will be addressed in addition to the Health Maintenance Visit.   Wellness Visit: annual visit with health maintenance review and exam without Pap   Health maintenance: All screenings are normal and up-to-date.  Eye exam is up-to-date.  She admits she has been exercising less.  Would like to start walking again.  Working on diet.  Weight has elevated a little bit.  All immunizations are up-to-date.  She has questions about the fourth Covid booster recommendation Chronic disease f/u and/or acute problem visit: (deemed necessary to be done in addition to the wellness visit):  Impaired fasting glucose: Denies symptoms of hyperglycemia.  She has regained about 5 pounds.  Diet is fair.  GERD: Active unless takes PPI daily.  Well-controlled on omeprazole 20 mg daily.  No melena.  IBS: Continues to have mild symptoms, often triggered by certain food types including salad and dairy.  Overall well controlled without significant pain or overwhelming symptoms.  Hypothyroidism: On daily levothyroxine.  Compliance is good.  She denies symptoms of high or low thyroid.  History of whitecoat hypertension: Resolved.  Normal blood pressures in the last year at our office.  Chronic idiopathic thrombocytopenia: No petechiae, rashes or bleeding.  Osteoarthritis of knees: Mild.  AM stiffness mostly.  Fatty liver: We will recheck liver test.  Tried a low-fat diet.  No right quadrant pain or jaundice.   Lab Results  Component Value Date   HGBA1C 5.9 (A) 06/27/2020   HGBA1C 5.8 (A) 12/30/2019   HGBA1C 5.5 10/13/2019    Lab Results  Component Value Date   TSH 3.58 12/30/2019   Lab Results  Component  Value Date   CHOL 148 12/30/2019   HDL 62.30 12/30/2019   LDLCALC 64 12/30/2019   TRIG 108.0 12/30/2019   CHOLHDL 2 12/30/2019    Assessment  1. Annual physical exam   2. White coat syndrome without diagnosis of hypertension   3. Impaired fasting glucose   4. Severe obesity (BMI 35.0-35.9 with comorbidity) ([place])   5. Acquired hypothyroidism   6. Primary osteoarthritis of left knee   7. Irritable bowel syndrome, unspecified type   8. Fatty liver   9. Thrombocytopenia ([place])   10. Gastroesophageal reflux disease without esophagitis      Plan  Female Wellness Visit:  Age appropriate Health Maintenance and Prevention measures were discussed with patient. Included topics are cancer screening recommendations, ways to keep healthy (see AVS) including dietary and exercise recommendations, regular eye and dental care, use of seat belts, and avoidance of moderate alcohol use and tobacco use.  Screens are up-to-date  BMI: discussed patient's BMI and encouraged positive lifestyle modifications to help get to or maintain a target BMI.  HM needs and immunizations were addressed and ordered. See below for orders. See HM and immunization section for updates.  Immunizations are up-to-date.  Counseling regarding pros versus cons of fourth Covid vaccination booster.  Discussed CDC recommendation.  Routine labs and screening tests ordered including cmp, cbc and lipids where appropriate.  Discussed recommendations regarding Vit D and calcium supplementation (see AVS)  Chronic disease management visit and/or acute problem visit:  Impaired fasting glucose: Recommend diet,  weight loss, exercise and recheck A1c today.  Hypothyroidism: Clinically euthyroid.  Recheck levels and adjust medications if needed.  Currently on 100 mcg daily  GERD: Well-controlled on daily Prilosec 20 mg daily.  Fatty liver: Educated on low-fat diet.  Monitor liver test.  Osteoarthritis of knee: Over-the-counter Voltaren  gel if needed.  Mild symptoms.  Whitecoat hypertension: Resolved   Follow up: 12 months for recheck and physical Orders Placed This Encounter  Procedures  . CBC with Differential/Platelet  . Comprehensive metabolic panel  . Hemoglobin A1c  . Lipid panel  . TSH   No orders of the defined types were placed in this encounter.     Body mass index is 38.8 kg/m. Wt Readings from Last 3 Encounters:  03/13/21 255 lb 3.2 oz (115.8 kg)  09/20/20 250 lb (113.4 kg)  09/18/20 250 lb (113.4 kg)     Patient Active Problem List   Diagnosis Date Noted  . White coat syndrome without diagnosis of hypertension 01/16/2018    Priority: High  . Severe obesity (BMI 35.0-35.9 with comorbidity) ([place])     Priority: High  . Acquired hypothyroidism 01/07/2011    Priority: High  . Impaired fasting glucose 01/05/2019    Priority: Medium  . Primary osteoarthritis of left knee 01/05/2019    Priority: Medium  . Family history of colon cancer 01/16/2018    Priority: Medium    Grandparents, elderly   . Family history of breast cancer 01/16/2018    Priority: Medium    Mom and MGM, 80s   . GERD (gastroesophageal reflux disease) 06/02/2014    Priority: Medium  . Irritable bowel syndrome 01/07/2011    Priority: Medium  . Fatty liver 01/07/2011    Priority: Medium  . Chronic allergic rhinitis 12/30/2019    Priority: Low  . Thrombocytopenia ([place]) 01/07/2019    Priority: Low    Chronic by lab review   . Visit for review of DEXA scan 08/30/2020    Dexa 08/2020; normal. Recheck in 5 years    Health Maintenance  Topic Date Due  . COVID-19 Vaccine (4 - Booster for Moderna series) 04/16/2021  . INFLUENZA VACCINE  07/09/2021  . MAMMOGRAM  08/29/2021  . PNA vac Low Risk Adult (2 of 2 - PPSV23) 01/16/2023  . TETANUS/TDAP  09/20/2024  . DEXA SCAN  08/29/2025  . COLONOSCOPY (Pts 45-26yrs Insurance coverage will need to be confirmed)  09/20/2025  . Hepatitis C Screening  Completed  . HPV VACCINES   Aged Out   Immunization History  Administered Date(s) Administered  . Influenza Whole 10/09/2010  . Influenza,inj,Quad PF,6+ Mos 09/20/2014, 09/29/2017, 10/05/2018, 09/15/2019  . Influenza-Unspecified 10/02/2016, 09/29/2017  . Moderna Sars-Covid-2 Vaccination 12/30/2019, 02/04/2020, 10/17/2020  . Pneumococcal Conjugate-13 09/15/2019  . Pneumococcal Polysaccharide-23 01/16/2018  . Td 12/09/1998, 01/07/2011  . Tdap 09/20/2014  . Varicella Zoster Immune Globulin 10/07/2014  . Zoster 09/08/2014  . Zoster Recombinat (Shingrix) 01/16/2018, 10/05/2018   We updated and reviewed the patient's past history in detail and it is documented below. Allergies: Patient is allergic to propoxyphene n-acetaminophen. Past Medical History Patient  has a past medical history of Cancer ([place]), Diabetes mellitus without complication ([place]), GERD (gastroesophageal reflux disease), Headache(784.0), Hypothyroidism, Irritable bowel syndrome (IBS), Primary osteoarthritis of left knee (01/05/2019), and Thrombopenia ([place]) (01/07/2019). Past Surgical History Patient  has a past surgical history that includes Abdominal hysterectomy; cyst removed; Flexible sigmoidoscopy (07/23/2011); Colonoscopy (N/A, 06/19/2015); Appendectomy (1991); Tonsillectomy and adenoidectomy; and Colonoscopy with propofol (N/A, 09/20/2020). Family History: Patient  family history includes Atrial fibrillation (age of onset: 51) in her mother; Breast cancer (age of onset: 31) in her maternal grandmother and mother; Colon cancer (age of onset: 67) in her maternal uncle; Colon cancer (age of onset: 61) in her maternal aunt. Social History:  Patient  reports that she has never smoked. She has never used smokeless tobacco. She reports current alcohol use. She reports that she does not use drugs.  Review of Systems: Constitutional: negative for fever or malaise Ophthalmic: negative for photophobia, double vision or loss of vision Cardiovascular: negative for  chest pain, dyspnea on exertion, or new LE swelling Respiratory: negative for SOB or persistent cough Gastrointestinal: negative for abdominal pain, change in bowel habits or melena Genitourinary: negative for dysuria or gross hematuria, no abnormal uterine bleeding or disharge Musculoskeletal: negative for new gait disturbance or muscular weakness Integumentary: negative for new or persistent rashes, no breast lumps Neurological: negative for TIA or stroke symptoms Psychiatric: negative for SI or delusions Allergic/Immunologic: negative for hives  Patient Care Team    Relationship Specialty Notifications Start End  Leamon Arnt, MD PCP - General Family Medicine  01/16/18   Rogene Houston, MD  Gastroenterology  06/18/13   Druscilla Brownie, MD  Dermatology  06/18/13   Erle Crocker, MD Consulting Physician Orthopedic Surgery  01/05/19   Dr. Rona Ravens Consulting Physician Ophthalmology  01/05/19    Comment: in [place]    Objective  Vitals: BP 124/82   Pulse 72   Temp 98.2 F (36.8 C) (Temporal)   Resp 16   Ht 5\' 8"  (1.727 m)   Wt 255 lb 3.2 oz (115.8 kg)   SpO2 97%   BMI 38.80 kg/m  General:  Well developed, well nourished, no acute distress  Psych:  Alert and orientedx3,normal mood and affect HEENT:  Normocephalic, atraumatic, non-icteric sclera,  supple neck without adenopathy, mass or thyromegaly Cardiovascular:  Normal S1, S2, RRR without gallop, rub or murmur Respiratory:  Good breath sounds bilaterally, CTAB with normal respiratory effort Gastrointestinal: normal bowel sounds, soft, non-tender, no noted masses. No HSM MSK: no deformities, contusions. Joints are without erythema or swelling.  Skin:  Warm, no rashes or suspicious lesions noted Neurologic:    Mental status is normal. CN 2-11 are normal. Gross motor and sensory exams are normal. Normal gait. No tremor Breast Exam: No mass, skin retraction or nipple discharge is appreciated in either breast. No axillary  adenopathy. Fibrocystic changes are not noted   Commons side effects, risks, benefits, and alternatives for medications and treatment plan prescribed today were discussed, and the patient expressed understanding of the given instructions. Patient is instructed to call or message via MyChart if he/she has any questions or concerns regarding our treatment plan. No barriers to understanding were identified. We discussed Red Flag symptoms and signs in detail. Patient expressed understanding regarding what to do in case of urgent or emergency type symptoms.   Medication list was reconciled, printed and provided to the patient in AVS. Patient instructions and summary information was reviewed with the patient as documented in the AVS. This note was prepared with assistance of Dragon voice recognition software. Occasional wrong-word or sound-a-like substitutions may have occurred due to the inherent limitations of voice recognition software  This visit occurred during the SARS-CoV-2 public health emergency.  Safety protocols were in place, including screening questions prior to the visit, additional usage of staff PPE, and extensive cleaning of exam room while observing appropriate contact time as indicated  for disinfecting solutions.

## 2021-03-13 NOTE — Patient Instructions (Addendum)
Please return in 12 months for your annual complete physical; please come fasting.  I will release your lab results to you on your MyChart account with further instructions. Please reply with any questions.   If you have any questions or concerns, please don't hesitate to send me a message via MyChart or call the office at [phone]. Thank you for visiting with [place] today! It's our pleasure caring for you.   Preventive Care 18 Years and Older, Female Preventive care refers to lifestyle choices and visits with your health care provider that can promote health and wellness. This includes:  A yearly physical exam. This is also called an annual wellness visit.  Regular dental and eye exams.  Immunizations.  Screening for certain conditions.  Healthy lifestyle choices, such as: ? Eating a healthy diet. ? Getting regular exercise. ? Not using drugs or products that contain nicotine and tobacco. ? Limiting alcohol use. What can I expect for my preventive care visit? Physical exam Your health care provider will check your:  Height and weight. These may be used to calculate your BMI (body mass index). BMI is a measurement that tells if you are at a healthy weight.  Heart rate and blood pressure.  Body temperature.  Skin for abnormal spots. Counseling Your health care provider may ask you questions about your:  Past medical problems.  Family's medical history.  Alcohol, tobacco, and drug use.  Emotional well-being.  Home life and relationship well-being.  Sexual activity.  Diet, exercise, and sleep habits.  History of falls.  Memory and ability to understand (cognition).  Work and work Statistician.  Pregnancy and menstrual history.  Access to firearms. What immunizations do I need? Vaccines are usually given at various ages, according to a schedule. Your health care provider will recommend vaccines for you based on your age, medical history, and lifestyle or other  factors, such as travel or where you work.   What tests do I need? Blood tests  Lipid and cholesterol levels. These may be checked every 5 years, or more often depending on your overall health.  Hepatitis C test.  Hepatitis B test. Screening  Lung cancer screening. You may have this screening every year starting at age 36 if you have a 30-pack-year history of smoking and currently smoke or have quit within the past 15 years.  Colorectal cancer screening. ? All adults should have this screening starting at age 29 and continuing until age 40. ? Your health care provider may recommend screening at age 37 if you are at increased risk. ? You will have tests every 1-10 years, depending on your results and the type of screening test.  Diabetes screening. ? This is done by checking your blood sugar (glucose) after you have not eaten for a while (fasting). ? You may have this done every 1-3 years.  Mammogram. ? This may be done every 1-2 years. ? Talk with your health care provider about how often you should have regular mammograms.  Abdominal aortic aneurysm (AAA) screening. You may need this if you are a current or former smoker.  BRCA-related cancer screening. This may be done if you have a family history of breast, ovarian, tubal, or peritoneal cancers. Other tests  STD (sexually transmitted disease) testing, if you are at risk.  Bone density scan. This is done to screen for osteoporosis. You may have this done starting at age 58. Talk with your health care provider about your test results, treatment options, and if necessary,  the need for more tests. Follow these instructions at home: Eating and drinking  Eat a diet that includes fresh fruits and vegetables, whole grains, lean protein, and low-fat dairy products. Limit your intake of foods with high amounts of sugar, saturated fats, and salt.  Take vitamin and mineral supplements as recommended by your health care provider.  Do  not drink alcohol if your health care provider tells you not to drink.  If you drink alcohol: ? Limit how much you have to 0-1 drink a day. ? Be aware of how much alcohol is in your drink. In the [place] one drink equals one 12 oz bottle of beer (355 mL), one 5 oz glass of wine (148 mL), or one 1 oz glass of hard liquor (44 mL).   Lifestyle  Take daily care of your teeth and gums. Brush your teeth every morning and night with fluoride toothpaste. Floss one time each day.  Stay active. Exercise for at least 30 minutes 5 or more days each week.  Do not use any products that contain nicotine or tobacco, such as cigarettes, e-cigarettes, and chewing tobacco. If you need help quitting, ask your health care provider.  Do not use drugs.  If you are sexually active, practice safe sex. Use a condom or other form of protection in order to prevent STIs (sexually transmitted infections).  Talk with your health care provider about taking a low-dose aspirin or statin.  Find healthy ways to cope with stress, such as: ? Meditation, yoga, or listening to music. ? Journaling. ? Talking to a trusted person. ? Spending time with friends and family. Safety  Always wear your seat belt while driving or riding in a vehicle.  Do not drive: ? If you have been drinking alcohol. Do not ride with someone who has been drinking. ? When you are tired or distracted. ? While texting.  Wear a helmet and other protective equipment during sports activities.  If you have firearms in your house, make sure you follow all gun safety procedures. What's next?  Visit your health care provider once a year for an annual wellness visit.  Ask your health care provider how often you should have your eyes and teeth checked.  Stay up to date on all vaccines. This information is not intended to replace advice given to you by your health care provider. Make sure you discuss any questions you have with your health care  provider. Document Revised: 11/15/2020 Document Reviewed: 11/19/2018 Elsevier Patient Education  2021 [employer].

## 2021-06-20 ENCOUNTER — Other Ambulatory Visit: Payer: Self-pay

## 2021-06-20 ENCOUNTER — Encounter: Payer: Self-pay | Admitting: Family Medicine

## 2021-06-20 ENCOUNTER — Ambulatory Visit: Payer: Medicare PPO | Admitting: Family Medicine

## 2021-06-20 VITALS — BP 122/86 | HR 77 | Temp 98.4°F | Wt 255.0 lb

## 2021-06-20 DIAGNOSIS — Z6835 Body mass index (BMI) 35.0-35.9, adult: Secondary | ICD-10-CM

## 2021-06-20 DIAGNOSIS — R030 Elevated blood-pressure reading, without diagnosis of hypertension: Secondary | ICD-10-CM | POA: Diagnosis not present

## 2021-06-20 DIAGNOSIS — K760 Fatty (change of) liver, not elsewhere classified: Secondary | ICD-10-CM

## 2021-06-20 DIAGNOSIS — E119 Type 2 diabetes mellitus without complications: Secondary | ICD-10-CM | POA: Insufficient documentation

## 2021-06-20 DIAGNOSIS — E039 Hypothyroidism, unspecified: Secondary | ICD-10-CM

## 2021-06-20 DIAGNOSIS — Z9189 Other specified personal risk factors, not elsewhere classified: Secondary | ICD-10-CM

## 2021-06-20 LAB — POCT GLYCOSYLATED HEMOGLOBIN (HGB A1C): Hemoglobin A1C: 6.1 % — AB (ref 4.0–5.6)

## 2021-06-20 MED ORDER — ROSUVASTATIN CALCIUM 5 MG PO TABS: 5.0000 mg | ORAL_TABLET | Freq: Every evening | ORAL | 3 refills | Status: DC

## 2021-06-20 MED ORDER — LEVOTHYROXINE SODIUM 100 MCG PO TABS: ORAL_TABLET | ORAL | 3 refills | Status: DC

## 2021-06-20 NOTE — Patient Instructions (Signed)
Please return in 6 months to recheck sugars.  If you have any questions or concerns, please don't hesitate to send me a message via MyChart or call the office at [phone]. Thank you for visiting with [place] today! It's our pleasure caring for you.   Diabetes Mellitus and Nutrition, Adult When you have diabetes, or diabetes mellitus, it is very important to have healthy eating habits because your blood sugar (glucose) levels are greatly affected by what you eat and drink. Eating healthy foods in the right amounts, at about the same times every day, can help you: Control your blood glucose. Lower your risk of heart disease. Improve your blood pressure. Reach or maintain a healthy weight. What can affect my meal plan? Every person with diabetes is different, and each person has different needs for a meal plan. Your health care provider may recommend that you work with a dietitian to make a meal plan that is best for you. Your meal plan may vary depending on factors such as: The calories you need. The medicines you take. Your weight. Your blood glucose, blood pressure, and cholesterol levels. Your activity level. Other health conditions you have, such as heart or kidney disease. How do carbohydrates affect me? Carbohydrates, also called carbs, affect your blood glucose level more than any other type of food. Eating carbs naturally raises the amount of glucose in your blood. Carb counting is a method for keeping track of how many carbs you eat. Counting carbs is important to keep your blood glucose at a healthy level,especially if you use insulin or take certain oral diabetes medicines. It is important to know how many carbs you can safely have in each meal. This is different for every person. Your dietitian can help you calculate how manycarbs you should have at each meal and for each snack. How does alcohol affect me? Alcohol can cause a sudden decrease in blood glucose (hypoglycemia), especially  if you use insulin or take certain oral diabetes medicines. Hypoglycemia can be a life-threatening condition. Symptoms of hypoglycemia, such as sleepiness, dizziness, and confusion, are similar to symptoms of having too much alcohol. Do not drink alcohol if: Your health care provider tells you not to drink. You are pregnant, may be pregnant, or are planning to become pregnant. If you drink alcohol: Do not drink on an empty stomach. Limit how much you use to: 0-1 drink a day for women. 0-2 drinks a day for men. Be aware of how much alcohol is in your drink. In the [place] one drink equals one 12 oz bottle of beer (355 mL), one 5 oz glass of wine (148 mL), or one 1 oz glass of hard liquor (44 mL). Keep yourself hydrated with water, diet soda, or unsweetened iced tea. Keep in mind that regular soda, juice, and other mixers may contain a lot of sugar and must be counted as carbs. What are tips for following this plan?  Reading food labels Start by checking the serving size on the "Nutrition Facts" label of packaged foods and drinks. The amount of calories, carbs, fats, and other nutrients listed on the label is based on one serving of the item. Many items contain more than one serving per package. Check the total grams (g) of carbs in one serving. You can calculate the number of servings of carbs in one serving by dividing the total carbs by 15. For example, if a food has 30 g of total carbs per serving, it would be equal to 2  servings of carbs. Check the number of grams (g) of saturated fats and trans fats in one serving. Choose foods that have a low amount or none of these fats. Check the number of milligrams (mg) of salt (sodium) in one serving. Most people should limit total sodium intake to less than 2,300 mg per day. Always check the nutrition information of foods labeled as "low-fat" or "nonfat." These foods may be higher in added sugar or refined carbs and should be avoided. Talk to your  dietitian to identify your daily goals for nutrients listed on the label. Shopping Avoid buying canned, pre-made, or processed foods. These foods tend to be high in fat, sodium, and added sugar. Shop around the outside edge of the grocery store. This is where you will most often find fresh fruits and vegetables, bulk grains, fresh meats, and fresh dairy. Cooking Use low-heat cooking methods, such as baking, instead of high-heat cooking methods like deep frying. Cook using healthy oils, such as olive, canola, or sunflower oil. Avoid cooking with butter, cream, or high-fat meats. Meal planning Eat meals and snacks regularly, preferably at the same times every day. Avoid going long periods of time without eating. Eat foods that are high in fiber, such as fresh fruits, vegetables, beans, and whole grains. Talk with your dietitian about how many servings of carbs you can eat at each meal. Eat 4-6 oz (112-168 g) of lean protein each day, such as lean meat, chicken, fish, eggs, or tofu. One ounce (oz) of lean protein is equal to: 1 oz (28 g) of meat, chicken, or fish. 1 egg.  cup (62 g) of tofu. Eat some foods each day that contain healthy fats, such as avocado, nuts, seeds, and fish. What foods should I eat? Fruits Berries. Apples. Oranges. Peaches. Apricots. Plums. Grapes. Mango. Papaya.Pomegranate. Kiwi. Cherries. Vegetables Lettuce. Spinach. Leafy greens, including kale, chard, collard greens, and mustard greens. Beets. Cauliflower. Cabbage. Broccoli. Carrots. Green beans.Tomatoes. Peppers. Onions. Cucumbers. Brussels sprouts. Grains Whole grains, such as whole-wheat or whole-grain bread, crackers, tortillas,cereal, and pasta. Unsweetened oatmeal. Quinoa. Brown or wild rice. Meats and other proteins Seafood. Poultry without skin. Lean cuts of poultry and beef. Tofu. Nuts. Seeds. Dairy Low-fat or fat-free dairy products such as milk, yogurt, and cheese. The items listed above may not be a  complete list of foods and beverages you can eat. Contact a dietitian for more information. What foods should I avoid? Fruits Fruits canned with syrup. Vegetables Canned vegetables. Frozen vegetables with butter or cream sauce. Grains Refined white flour and flour products such as bread, pasta, snack foods, andcereals. Avoid all processed foods. Meats and other proteins Fatty cuts of meat. Poultry with skin. Breaded or fried meats. Processed meat.Avoid saturated fats. Dairy Full-fat yogurt, cheese, or milk. Beverages Sweetened drinks, such as soda or iced tea. The items listed above may not be a complete list of foods and beverages you should avoid. Contact a dietitian for more information. Questions to ask a health care provider Do I need to meet with a diabetes educator? Do I need to meet with a dietitian? What number can I call if I have questions? When are the best times to check my blood glucose? Where to find more information: American Diabetes Association: diabetes.org Academy of Nutrition and Dietetics: www.eatright.[employer] of Diabetes and Digestive and Kidney Diseases: [employer] Association of Diabetes Care and Education Specialists: www.diabeteseducator.org Summary It is important to have healthy eating habits because your blood sugar (glucose) levels are greatly  affected by what you eat and drink. A healthy meal plan will help you control your blood glucose and maintain a healthy lifestyle. Your health care provider may recommend that you work with a dietitian to make a meal plan that is best for you. Keep in mind that carbohydrates (carbs) and alcohol have immediate effects on your blood glucose levels. It is important to count carbs and to use alcohol carefully. This information is not intended to replace advice given to you by your health care provider. Make sure you discuss any questions you have with your healthcare provider. Document Revised:  11/02/2019 Document Reviewed: 11/02/2019 Elsevier Patient Education  2021 [employer].

## 2021-06-20 NOTE — Progress Notes (Signed)
Subjective  CC:  Chief Complaint  Patient presents with   Diabetes    HPI: Shelly [name] is a 65 y.o. female who presents to the office today for follow up of diabetes and problems listed above in the chief complaint.  Diabetes follow up: new recurrent diagnosis; eating well. Actually over restrictive at 1200kcal / day. Weight is stable but wants to lose. No sxs of hyperglycemia. Eye exam in September. Has excellent LDL; not on statin. Not on ace. Normotensive with thite coat syndrome.   Wt Readings from Last 3 Encounters:  06/20/21 255 lb (115.7 kg)  03/13/21 255 lb 3.2 oz (115.8 kg)  09/20/20 250 lb (113.4 kg)    BP Readings from Last 3 Encounters:  06/20/21 122/86  03/13/21 124/82  09/20/20 126/79    Assessment  1. Diet-controlled diabetes mellitus ([place])   2. White coat syndrome without diagnosis of hypertension   3. Fatty liver   4. Candidate for statin therapy due to risk of future cardiovascular event   5. Severe obesity (BMI 35.0-35.9 with comorbidity) ([place])      Plan  Diabetes is currently very well controlled. Discussed goals of care, nutrition, starting low dose crestor.  Obesity: rec increasing caloric intake and changing exercise routine.  Lipids: discussed benefits of statin.  Fatty liver; weight loss recommended Continue home monitoring of bp   Follow up: recheck 6 months. Orders Placed This Encounter  Procedures   POCT HgB A1C   Meds ordered this encounter  Medications   rosuvastatin (CRESTOR) 5 MG tablet    Sig: Take 1 tablet (5 mg total) by mouth at bedtime.    Dispense:  90 tablet    Refill:  3       Immunization History  Administered Date(s) Administered   Influenza Whole 10/09/2010   Influenza,inj,Quad PF,6+ Mos 09/20/2014, 09/29/2017, 10/05/2018, 09/15/2019   Influenza-Unspecified 10/02/2016, 09/29/2017   Moderna Sars-Covid-2 Vaccination 12/30/2019, 02/04/2020, 10/17/2020   Pneumococcal Conjugate-13 09/15/2019    Pneumococcal Polysaccharide-23 01/16/2018   Td 12/09/1998, 01/07/2011   Tdap 09/20/2014   Varicella Zoster Immune Globulin 10/07/2014   Zoster Recombinat (Shingrix) 01/16/2018, 10/05/2018   Zoster, Live 09/08/2014    Diabetes Related Lab Review: Lab Results  Component Value Date   HGBA1C 6.1 (A) 06/20/2021   HGBA1C 6.5 03/13/2021   HGBA1C 5.9 (A) 06/27/2020    Lab Results  Component Value Date   MICROALBUR <0.7 12/30/2019   Lab Results  Component Value Date   CREATININE 0.82 03/13/2021   BUN 13 03/13/2021   NA 139 03/13/2021   K 4.2 03/13/2021   CL 106 03/13/2021   CO2 25 03/13/2021   Lab Results  Component Value Date   CHOL 128 03/13/2021   CHOL 148 12/30/2019   CHOL 142 01/05/2019   Lab Results  Component Value Date   HDL 61.40 03/13/2021   HDL 62.30 12/30/2019   HDL 60.70 01/05/2019   Lab Results  Component Value Date   LDLCALC 48 03/13/2021   LDLCALC 64 12/30/2019   LDLCALC 61 01/05/2019   Lab Results  Component Value Date   TRIG 95.0 03/13/2021   TRIG 108.0 12/30/2019   TRIG 104.0 01/05/2019   Lab Results  Component Value Date   CHOLHDL 2 03/13/2021   CHOLHDL 2 12/30/2019   CHOLHDL 2 01/05/2019   No results found for: LDLDIRECT The ASCVD Risk score Mikey Bussing DC Jr., et al., 2013) failed to calculate for the following reasons:   The valid total cholesterol  range is 130 to 320 mg/dL I have reviewed the PMH, Fam and Soc history. Patient Active Problem List   Diagnosis Date Noted   Diet-controlled diabetes mellitus ([place]) 06/20/2021    Priority: High   White coat syndrome without diagnosis of hypertension 01/16/2018    Priority: High   Severe obesity (BMI 35.0-35.9 with comorbidity) ([place])     Priority: High   Acquired hypothyroidism 01/07/2011    Priority: High   Primary osteoarthritis of left knee 01/05/2019    Priority: Medium   Family history of colon cancer 01/16/2018    Priority: Medium    Grandparents, elderly     Family history of  breast cancer 01/16/2018    Priority: Medium    Mom and MGM, 80s     GERD (gastroesophageal reflux disease) 06/02/2014    Priority: Medium   Irritable bowel syndrome 01/07/2011    Priority: Medium   Fatty liver 01/07/2011    Priority: Medium   Chronic allergic rhinitis 12/30/2019    Priority: Low   Thrombocytopenia ([place]) 01/07/2019    Priority: Low    Chronic by lab review     Candidate for statin therapy due to risk of future cardiovascular event 06/20/2021   Visit for review of DEXA scan 08/30/2020    Dexa 08/2020; normal. Recheck in 5 years      Social History: Patient  reports that she has never smoked. She has never used smokeless tobacco. She reports current alcohol use. She reports that she does not use drugs.  Review of Systems: Ophthalmic: negative for eye pain, loss of vision or double vision Cardiovascular: negative for chest pain Respiratory: negative for SOB or persistent cough Gastrointestinal: negative for abdominal pain Genitourinary: negative for dysuria or gross hematuria MSK: negative for foot lesions Neurologic: negative for weakness or gait disturbance  Objective  Vitals: BP 122/86   Pulse 77   Temp 98.4 F (36.9 C) (Temporal)   Wt 255 lb (115.7 kg)   SpO2 98%   BMI 38.77 kg/m  General: well appearing, no acute distress  Psych:  Alert and oriented, normal mood and affect Foot exam: no erythema, pallor, or cyanosis visible nl proprioception and sensation to monofilament testing bilaterally, +2 distal pulses bilaterally  Diabetic education: ongoing education regarding chronic disease management for diabetes was given today. We continue to reinforce the ABC's of diabetic management: A1c (<7 or 8 dependent upon patient), tight blood pressure control, and cholesterol management with goal LDL < 100 minimally. We discuss diet strategies, exercise recommendations, medication options and possible side effects. At each visit, we review recommended  immunizations and preventive care recommendations for diabetics and stress that good diabetic control can prevent other problems. See below for this patient's data.   Commons side effects, risks, benefits, and alternatives for medications and treatment plan prescribed today were discussed, and the patient expressed understanding of the given instructions. Patient is instructed to call or message via MyChart if he/she has any questions or concerns regarding our treatment plan. No barriers to understanding were identified. We discussed Red Flag symptoms and signs in detail. Patient expressed understanding regarding what to do in case of urgent or emergency type symptoms.  Medication list was reconciled, printed and provided to the patient in AVS. Patient instructions and summary information was reviewed with the patient as documented in the AVS. This note was prepared with assistance of Dragon voice recognition software. Occasional wrong-word or sound-a-like substitutions may have occurred due to the inherent limitations of voice  recognition software  This visit occurred during the SARS-CoV-2 public health emergency.  Safety protocols were in place, including screening questions prior to the visit, additional usage of staff PPE, and extensive cleaning of exam room while observing appropriate contact time as indicated for disinfecting solutions.

## 2021-06-29 ENCOUNTER — Telehealth: Payer: Self-pay

## 2021-06-29 ENCOUNTER — Other Ambulatory Visit: Payer: Self-pay

## 2021-06-29 DIAGNOSIS — E039 Hypothyroidism, unspecified: Secondary | ICD-10-CM

## 2021-06-29 MED ORDER — LEVOTHYROXINE SODIUM 100 MCG PO TABS: ORAL_TABLET | ORAL | 3 refills | Status: DC

## 2021-06-29 NOTE — Telephone Encounter (Signed)
Patient states she picked up levothyroxine at the pharmacy.  States the pharmacy only gave her 30 pills.  Script states 90 pills.  Pharmacy is informing patient that we did not send script for a quantity of 90.  Patient is requesting our office to reach out to the pharmacy.  [facility] in [place].

## 2021-06-29 NOTE — Telephone Encounter (Signed)
I have resent script to pharmacy. Pharmacist states they will fill next script for 90 days

## 2021-07-20 ENCOUNTER — Other Ambulatory Visit: Payer: Self-pay | Admitting: Family Medicine

## 2021-07-20 DIAGNOSIS — Z1231 Encounter for screening mammogram for malignant neoplasm of breast: Secondary | ICD-10-CM

## 2021-07-23 DIAGNOSIS — L570 Actinic keratosis: Secondary | ICD-10-CM | POA: Diagnosis not present

## 2021-07-23 DIAGNOSIS — B078 Other viral warts: Secondary | ICD-10-CM | POA: Diagnosis not present

## 2021-07-26 ENCOUNTER — Ambulatory Visit: Payer: Medicare PPO

## 2021-08-14 ENCOUNTER — Other Ambulatory Visit (INDEPENDENT_AMBULATORY_CARE_PROVIDER_SITE_OTHER): Payer: Self-pay | Admitting: Internal Medicine

## 2021-08-14 DIAGNOSIS — K589 Irritable bowel syndrome without diarrhea: Secondary | ICD-10-CM

## 2021-08-14 NOTE — Telephone Encounter (Signed)
Last seen 08/17/2019 by Lorelei Pont. Will address with Dr. Laural Golden.

## 2021-08-23 ENCOUNTER — Ambulatory Visit (INDEPENDENT_AMBULATORY_CARE_PROVIDER_SITE_OTHER): Payer: Medicare PPO

## 2021-08-23 DIAGNOSIS — Z0000 Encounter for general adult medical examination without abnormal findings: Secondary | ICD-10-CM | POA: Diagnosis not present

## 2021-08-23 NOTE — Progress Notes (Addendum)
Virtual Visit via Telephone Note  I connected with  Shelly [name] on 08/23/21 at  9:30 AM EDT by telephone and verified that I am speaking with the correct person using two identifiers.  Medicare Annual Wellness visit completed telephonically due to Covid-19 pandemic.   Persons participating in this call: This Health Coach and this patient.   Location: Patient: Home Provider: Office   I discussed the limitations, risks, security and privacy concerns of performing an evaluation and management service by telephone and the availability of in person appointments. The patient expressed understanding and agreed to proceed.  Unable to perform video visit due to video visit attempted and failed and/or patient does not have video capability.   Some vital signs may be absent or patient reported.   Willette Brace, LPN   Subjective:   Shelly [name] is a 65 y.o. female who presents for Medicare Annual (Subsequent) preventive examination.  Review of Systems     Cardiac Risk Factors include: advanced age (>44mn, >>93women);obesity (BMI >30kg/m2);diabetes mellitus     Objective:    There were no vitals filed for this visit. There is no height or weight on file to calculate BMI.  Advanced Directives 08/23/2021 09/18/2020 06/19/2015  Does Patient Have a Medical Advance Directive? No No No  Would patient like information on creating a medical advance directive? Yes (MAU/Ambulatory/Procedural Areas - Information given) No - Patient declined No - patient declined information    Current Medications (verified) Outpatient Encounter Medications as of 08/23/2021  Medication Sig   Artificial Tear Solution (SOOTHE XP OP) Place 1 drop into both eyes daily as needed (dry eye).    fexofenadine (ALLEGRA) 180 MG tablet Take 180 mg by mouth daily.   hyoscyamine (LEVSIN SL) 0.125 MG SL tablet PLACE 1 TABLET UNDER THETONGUE THREE TIMES DAILY AS NEEDED.   ibuprofen (ADVIL) 200 MG tablet  Take 400 mg by mouth every 6 (six) hours as needed for headache.   levothyroxine (SYNTHROID) 100 MCG tablet TAKE 1 TABLET BY MOUTH DAILY BEFORE BREAKFAST.   omeprazole (PRILOSEC) 20 MG capsule Take 1 capsule (20 mg total) by mouth daily.   rosuvastatin (CRESTOR) 5 MG tablet Take 1 tablet (5 mg total) by mouth at bedtime.   No facility-administered encounter medications on file as of 08/23/2021.    Allergies (verified) Propoxyphene n-acetaminophen   History: Past Medical History:  Diagnosis Date   [place]    Basal Cell    Diabetes mellitus without complication (HCC)    GERD (gastroesophageal reflux disease)    Headache(784.0)    Hypothyroidism    Irritable bowel syndrome (IBS)    Primary osteoarthritis of left knee 01/05/2019   Thrombopenia ([place] 01/07/2019   Chronic by lab review   Past Surgical History:  Procedure Laterality Date   ABDOMINAL HYSTERECTOMY     APPENDECTOMY  1991   COLONOSCOPY N/A 06/19/2015   Procedure: COLONOSCOPY;  Surgeon: NRogene Houston MD;  [facility];  Service: Endoscopy;  Laterality: N/A;  8:30-9:30   COLONOSCOPY WITH PROPOFOL N/A 09/20/2020   Procedure: COLONOSCOPY WITH PROPOFOL;  Surgeon: RRogene Houston MD;  [facility];  Service: Endoscopy;  Laterality: N/A;  1015   cyst removed     right arm   FLEXIBLE SIGMOIDOSCOPY  07/23/2011   Procedure: FLEXIBLE SIGMOIDOSCOPY;  Surgeon: NRogene Houston MD;  [facility];  Service: Endoscopy;  Laterality: N/A;   TONSILLECTOMY AND ADENOIDECTOMY     Family History  Problem Relation Age of Onset   Breast cancer Mother 56   Atrial fibrillation Mother 80       s/p ablation   Breast cancer Maternal Grandmother 80   Colon cancer Maternal Aunt 80   Colon cancer Maternal Uncle 79   Social History   Socioeconomic History   Marital status: Married    Spouse name: Not on file   Number of children: Not on file   Years of education: Not on file   Highest education level:  Not on file  Occupational History   Not on file  Tobacco Use   Smoking status: Never   Smokeless tobacco: Never  Substance and Sexual Activity   Alcohol use: Yes    Comment: social   Drug use: No   Sexual activity: Yes    Birth control/protection: Surgical  Other Topics Concern   Not on file  Social History Narrative   Not on file   Social Determinants of Health   Financial Resource Strain: Low Risk    Difficulty of Paying Living Expenses: Not hard at all  Food Insecurity: No Food Insecurity   Worried About Charity fundraiser in the Last Year: Never true   [place] in the Last Year: Never true  Transportation Needs: No Transportation Needs   Lack of Transportation (Medical): No   Lack of Transportation (Non-Medical): No  Physical Activity: Insufficiently Active   Days of Exercise per Week: 3 days   Minutes of Exercise per Session: 40 min  Stress: No Stress Concern Present   Feeling of Stress : Not at all  Social Connections: Moderately Integrated   Frequency of Communication with Friends and Family: More than three times a week   Frequency of Social Gatherings with Friends and Family: More than three times a week   Attends Religious Services: More than 4 times per year   Active Member of [employer] or Organizations: No   Attends Music therapist: Never   Marital Status: Married    Tobacco Counseling Counseling given: Not Answered   Clinical Intake:  Pre-visit preparation completed: Yes  Pain : No/denies pain     BMI - recorded: 38.77 Nutritional Status: BMI > 30  Obese Nutritional Risks: None Diabetes: Yes CBG done?: No Did pt. bring in CBG monitor from home?: No  How often do you need to have someone help you when you read instructions, pamphlets, or other written materials from your doctor or pharmacy?: 1 - Never  Diabetic?Nutrition Risk Assessment:  Has the patient had any N/V/D within the last 2 months?  No  Does the patient have  any non-healing wounds?  No  Has the patient had any unintentional weight loss or weight gain?  No   Diabetes:  Is the patient diabetic?  Yes  If diabetic, was a CBG obtained today?  No  Did the patient bring in their glucometer from home?  No  How often do you monitor your CBG's? N/A.   Financial Strains and Diabetes Management:  Are you having any financial strains with the device, your supplies or your medication? No .  Does the patient want to be seen by Chronic Care Management for management of their diabetes?  No  Would the patient like to be referred to a Nutritionist or for Diabetic Management?  No   Diabetic Exams:  Diabetic Eye Exam: Overdue for diabetic eye exam. Pt has been advised about the importance in completing this exam. Patient advised to  call and schedule an eye exam. Diabetic Foot Exam: Completed 06/20/21   Interpreter Needed?: No  Information entered by :: Charlott Rakes, LPN   Activities of Daily Living In your present state of health, do you have any difficulty performing the following activities: 08/23/2021 03/13/2021  Hearing? N N  Vision? N N  Difficulty concentrating or making decisions? N N  Walking or climbing stairs? Y N  Comment knne hurt after multiple stairs -  Dressing or bathing? N N  Doing errands, shopping? N N  Preparing Food and eating ? N -  Using the Toilet? N -  In the past six months, have you accidently leaked urine? N -  Do you have problems with loss of bowel control? N -  Managing your Medications? N -  Managing your Finances? N -  Housekeeping or managing your Housekeeping? N -  Some recent data might be hidden    Patient Care Team: Leamon Arnt, MD as PCP - General (Family Medicine) Rogene Houston, MD (Gastroenterology) Druscilla Brownie, MD (Dermatology) Erle Crocker, MD as Consulting Physician (Orthopedic Surgery) Dr. Rona Ravens as Consulting Physician (Ophthalmology)  Indicate any recent Medical Services you  may have received from other than [facility] providers in the past year (date may be approximate).     Assessment:   This is a routine wellness examination for [place].  Hearing/Vision screen Hearing Screening - Comments:: Pt denies any hearing issues  Vision Screening - Comments:: [facility] [place] [facility]   Dietary issues and exercise activities discussed: Current Exercise Habits: Home exercise routine, Type of exercise: walking, Time (Minutes): 45, Frequency (Times/Week): 3, Weekly Exercise (Minutes/Week): 135   Goals Addressed             This Visit's Progress    Patient Stated       Lose weight        Depression Screen PHQ 2/9 Scores 08/23/2021 03/13/2021 10/13/2019 01/05/2019 04/07/2018 01/16/2018  PHQ - 2 Score 0 0 0 0 0 0  PHQ- 9 Score - - - - 0 0    Fall Risk Fall Risk  08/23/2021 03/13/2021 10/13/2019 07/06/2019 01/05/2019  Falls in the past year? 0 0 1 1 0  Number falls in past yr: 0 0 0 0 0  Injury with Fall? 0 0 0 0 0  Risk for fall due to : Impaired balance/gait;Impaired vision - History of fall(s) - -  Follow up Falls prevention discussed - Falls evaluation completed Falls evaluation completed Falls evaluation completed    FALL RISK PREVENTION PERTAINING TO THE HOME:  Any stairs in or around the home? Yes  If so, are there any without handrails? No  Home free of loose throw rugs in walkways, pet beds, electrical cords, etc? Yes  Adequate lighting in your home to reduce risk of falls? Yes   ASSISTIVE DEVICES UTILIZED TO PREVENT FALLS:  Life alert? No  Use of a cane, walker or w/c? No  Grab bars in the bathroom? No  Shower chair or bench in shower? No  Elevated toilet seat or a handicapped toilet? No   TIMED UP AND GO:  Was the test performed? No .  Cognitive Function:     6CIT Screen 08/23/2021  What Year? 0 points  What month? 0 points  What time? 0 points  Count back from 20 0 points  Months in reverse 0 points  Repeat  phrase 0 points  Total Score 0  Immunizations Immunization History  Administered Date(s) Administered   Influenza Whole 10/09/2010   Influenza,inj,Quad PF,6+ Mos 09/20/2014, 09/29/2017, 10/05/2018, 09/15/2019   Influenza-Unspecified 10/02/2016, 09/29/2017   Moderna Sars-Covid-2 Vaccination 12/30/2019, 02/04/2020, 10/17/2020   Pneumococcal Conjugate-13 09/15/2019   Pneumococcal Polysaccharide-23 01/16/2018   Td 12/09/1998, 01/07/2011   Tdap 09/20/2014   Varicella Zoster Immune Globulin 10/07/2014   Zoster Recombinat (Shingrix) 01/16/2018, 10/05/2018   Zoster, Live 09/08/2014    TDAP status: Up to date  Flu Vaccine status: Due, Education has been provided regarding the importance of this vaccine. Advised may receive this vaccine at local pharmacy or Health Dept. Aware to provide a copy of the vaccination record if obtained from local pharmacy or Health Dept. Verbalized acceptance and understanding.  Pneumococcal vaccine status: Up to date  Covid-19 vaccine status: Completed vaccines  Qualifies for Shingles Vaccine? Yes   Zostavax completed Yes   Shingrix Completed?: Yes  Screening Tests Health Maintenance  Topic Date Due   OPHTHALMOLOGY EXAM  09/11/2020   COVID-19 Vaccine (4 - Booster for Moderna series) 01/09/2021   INFLUENZA VACCINE  07/09/2021   MAMMOGRAM  08/29/2021   HEMOGLOBIN A1C  12/21/2021   FOOT EXAM  06/20/2022   PNA vac Low Risk Adult (2 of 2 - PPSV23) 01/16/2023   TETANUS/TDAP  09/20/2024   DEXA SCAN  08/29/2025   COLONOSCOPY (Pts 45-39yr Insurance coverage will need to be confirmed)  09/20/2025   Hepatitis C Screening  Completed   Zoster Vaccines- Shingrix  Completed   HPV VACCINES  Aged Out    Health Maintenance  Health Maintenance Due  Topic Date Due   OPHTHALMOLOGY EXAM  09/11/2020   COVID-19 Vaccine (4 - Booster for Moderna series) 01/09/2021   INFLUENZA VACCINE  07/09/2021    Colorectal cancer screening: Type of screening: Colonoscopy.  Completed 09/20/20. Repeat every 5 years  Mammogram status: Completed 08/29/20. Repeat every year  Bone Density status: Completed 08/29/20. Results reflect: Bone density results: NORMAL. Repeat every 5 years.   Additional Screening:  Hepatitis C Screening:  Completed 01/05/19  Vision Screening: Recommended annual ophthalmology exams for early detection of glaucoma and other disorders of the eye. Is the patient up to date with their annual eye exam?  Yes  Who is the provider or what is the name of the office in which the patient attends annual eye exams[facility] [place] is not established with a provider, would they like to be referred to a provider to establish care? No .   Dental Screening: Recommended annual dental exams for proper oral hygiene  Community Resource Referral / Chronic Care Management: CRR required this visit?  No   CCM required this visit?  No      Plan:     I have personally reviewed and noted the following in the patient's chart:   Medical and social history Use of alcohol, tobacco or illicit drugs  Current medications and supplements including opioid prescriptions.  Functional ability and status Nutritional status Physical activity Advanced directives List of other physicians Hospitalizations, surgeries, and ER visits in previous 12 months Vitals Screenings to include cognitive, depression, and falls Referrals and appointments  In addition, I have reviewed and discussed with patient certain preventive protocols, quality metrics, and best practice recommendations. A written personalized care plan for preventive services as well as general preventive health recommendations were provided to patient.     TWillette Brace LPN   [id number]  Nurse Notes: None

## 2021-08-23 NOTE — Patient Instructions (Signed)
Shelly [name] , Thank you for taking time to come for your Medicare Wellness Visit. I appreciate your ongoing commitment to your health goals. Please review the following plan we discussed and let me know if I can assist you in the future.   Screening recommendations/referrals: Colonoscopy: Done 09/20/20 repeat every 5 years  Mammogram: Done 08/29/20 repeat every year  Bone Density: Done 08/29/20 repeat every 2 years  Recommended yearly ophthalmology/optometry visit for glaucoma screening and checkup Recommended yearly dental visit for hygiene and checkup  Vaccinations: Influenza vaccine: Due Pneumococcal vaccine:Completed  Tdap vaccine: Done 09/20/14 repeat every 10 years  Shingles vaccine: Completed 2/8 & 10/05/18 Covid-19:Completed 1/21, 2/26, & 10/17/20  Advanced directives: Advance directive discussed with you today. I have provided a copy for you to complete at home and have notarized. Once this is complete please bring a copy in to our office so we can scan it into your chart.  Conditions/risks identified: Lose weight   Next appointment: Follow up in one year for your annual wellness visit    Preventive Care 65 Years and Older, Female Preventive care refers to lifestyle choices and visits with your health care provider that can promote health and wellness. What does preventive care include? A yearly physical exam. This is also called an annual well check. Dental exams once or twice a year. Routine eye exams. Ask your health care provider how often you should have your eyes checked. Personal lifestyle choices, including: Daily care of your teeth and gums. Regular physical activity. Eating a healthy diet. Avoiding tobacco and drug use. Limiting alcohol use. Practicing safe sex. Taking low-dose aspirin every day. Taking vitamin and mineral supplements as recommended by your health care provider. What happens during an annual well check? The services and screenings done by your  health care provider during your annual well check will depend on your age, overall health, lifestyle risk factors, and family history of disease. Counseling  Your health care provider may ask you questions about your: Alcohol use. Tobacco use. Drug use. Emotional well-being. Home and relationship well-being. Sexual activity. Eating habits. History of falls. Memory and ability to understand (cognition). Work and work Statistician. Reproductive health. Screening  You may have the following tests or measurements: Height, weight, and BMI. Blood pressure. Lipid and cholesterol levels. These may be checked every 5 years, or more frequently if you are over 33 years old. Skin check. Lung cancer screening. You may have this screening every year starting at age 24 if you have a 30-pack-year history of smoking and currently smoke or have quit within the past 15 years. Fecal occult blood test (FOBT) of the stool. You may have this test every year starting at age 94. Flexible sigmoidoscopy or colonoscopy. You may have a sigmoidoscopy every 5 years or a colonoscopy every 10 years starting at age 54. Hepatitis C blood test. Hepatitis B blood test. Sexually transmitted disease (STD) testing. Diabetes screening. This is done by checking your blood sugar (glucose) after you have not eaten for a while (fasting). You may have this done every 1-3 years. Bone density scan. This is done to screen for osteoporosis. You may have this done starting at age 73. Mammogram. This may be done every 1-2 years. Talk to your health care provider about how often you should have regular mammograms. Talk with your health care provider about your test results, treatment options, and if necessary, the need for more tests. Vaccines  Your health care provider may recommend certain vaccines, such  as: Influenza vaccine. This is recommended every year. Tetanus, diphtheria, and acellular pertussis (Tdap, Td) vaccine. You may  need a Td booster every 10 years. Zoster vaccine. You may need this after age 40. Pneumococcal 13-valent conjugate (PCV13) vaccine. One dose is recommended after age 37. Pneumococcal polysaccharide (PPSV23) vaccine. One dose is recommended after age 103. Talk to your health care provider about which screenings and vaccines you need and how often you need them. This information is not intended to replace advice given to you by your health care provider. Make sure you discuss any questions you have with your health care provider. Document Released: 12/22/2015 Document Revised: 08/14/2016 Document Reviewed: 09/26/2015 Elsevier Interactive Patient Education  2017 [place] Prevention in the Home Falls can cause injuries. They can happen to people of all ages. There are many things you can do to make your home safe and to help prevent falls. What can I do on the outside of my home? Regularly fix the edges of walkways and driveways and fix any cracks. Remove anything that might make you trip as you walk through a door, such as a raised step or threshold. Trim any bushes or trees on the path to your home. Use bright outdoor lighting. Clear any walking paths of anything that might make someone trip, such as rocks or tools. Regularly check to see if handrails are loose or broken. Make sure that both sides of any steps have handrails. Any raised decks and porches should have guardrails on the edges. Have any leaves, snow, or ice cleared regularly. Use sand or salt on walking paths during winter. Clean up any spills in your garage right away. This includes oil or grease spills. What can I do in the bathroom? Use night lights. Install grab bars by the toilet and in the tub and shower. Do not use towel bars as grab bars. Use non-skid mats or decals in the tub or shower. If you need to sit down in the shower, use a plastic, non-slip stool. Keep the floor dry. Clean up any water that spills on  the floor as soon as it happens. Remove soap buildup in the tub or shower regularly. Attach bath mats securely with double-sided non-slip rug tape. Do not have throw rugs and other things on the floor that can make you trip. What can I do in the bedroom? Use night lights. Make sure that you have a light by your bed that is easy to reach. Do not use any sheets or blankets that are too big for your bed. They should not hang down onto the floor. Have a firm chair that has side arms. You can use this for support while you get dressed. Do not have throw rugs and other things on the floor that can make you trip. What can I do in the kitchen? Clean up any spills right away. Avoid walking on wet floors. Keep items that you use a lot in easy-to-reach places. If you need to reach something above you, use a strong step stool that has a grab bar. Keep electrical cords out of the way. Do not use floor polish or wax that makes floors slippery. If you must use wax, use non-skid floor wax. Do not have throw rugs and other things on the floor that can make you trip. What can I do with my stairs? Do not leave any items on the stairs. Make sure that there are handrails on both sides of the stairs and use  them. Fix handrails that are broken or loose. Make sure that handrails are as long as the stairways. Check any carpeting to make sure that it is firmly attached to the stairs. Fix any carpet that is loose or worn. Avoid having throw rugs at the top or bottom of the stairs. If you do have throw rugs, attach them to the floor with carpet tape. Make sure that you have a light switch at the top of the stairs and the bottom of the stairs. If you do not have them, ask someone to add them for you. What else can I do to help prevent falls? Wear shoes that: Do not have high heels. Have rubber bottoms. Are comfortable and fit you well. Are closed at the toe. Do not wear sandals. If you use a stepladder: Make sure  that it is fully opened. Do not climb a closed stepladder. Make sure that both sides of the stepladder are locked into place. Ask someone to hold it for you, if possible. Clearly mark and make sure that you can see: Any grab bars or handrails. First and last steps. Where the edge of each step is. Use tools that help you move around (mobility aids) if they are needed. These include: Canes. Walkers. Scooters. Crutches. Turn on the lights when you go into a dark area. Replace any light bulbs as soon as they burn out. Set up your furniture so you have a clear path. Avoid moving your furniture around. If any of your floors are uneven, fix them. If there are any pets around you, be aware of where they are. Review your medicines with your doctor. Some medicines can make you feel dizzy. This can increase your chance of falling. Ask your doctor what other things that you can do to help prevent falls. This information is not intended to replace advice given to you by your health care provider. Make sure you discuss any questions you have with your health care provider. Document Released: 09/21/2009 Document Revised: 05/02/2016 Document Reviewed: 12/30/2014 Elsevier Interactive Patient Education  2017 [employer].

## 2021-09-07 ENCOUNTER — Ambulatory Visit
Admission: RE | Admit: 2021-09-07 | Discharge: 2021-09-07 | Disposition: A | Discharge status: Home / Self Care | Payer: Medicare PPO | Source: Ambulatory Visit | Attending: Family Medicine | Admitting: Family Medicine

## 2021-09-07 ENCOUNTER — Other Ambulatory Visit: Payer: Self-pay

## 2021-09-07 DIAGNOSIS — Z1231 Encounter for screening mammogram for malignant neoplasm of breast: Secondary | ICD-10-CM | POA: Diagnosis not present

## 2021-10-02 DIAGNOSIS — D225 Melanocytic nevi of trunk: Secondary | ICD-10-CM | POA: Diagnosis not present

## 2021-10-02 DIAGNOSIS — L821 Other seborrheic keratosis: Secondary | ICD-10-CM | POA: Diagnosis not present

## 2021-10-02 DIAGNOSIS — L814 Other melanin hyperpigmentation: Secondary | ICD-10-CM | POA: Diagnosis not present

## 2021-10-02 DIAGNOSIS — D485 Neoplasm of uncertain behavior of skin: Secondary | ICD-10-CM | POA: Diagnosis not present

## 2021-10-02 DIAGNOSIS — L820 Inflamed seborrheic keratosis: Secondary | ICD-10-CM | POA: Diagnosis not present

## 2021-10-04 ENCOUNTER — Encounter: Payer: Self-pay | Admitting: Family Medicine

## 2021-12-21 ENCOUNTER — Ambulatory Visit: Payer: Medicare PPO | Admitting: Family Medicine

## 2022-01-23 ENCOUNTER — Other Ambulatory Visit: Payer: Self-pay | Admitting: Family Medicine

## 2022-01-23 DIAGNOSIS — K219 Gastro-esophageal reflux disease without esophagitis: Secondary | ICD-10-CM

## 2022-02-03 ENCOUNTER — Other Ambulatory Visit: Payer: Self-pay | Admitting: Family Medicine

## 2022-02-03 DIAGNOSIS — K219 Gastro-esophageal reflux disease without esophagitis: Secondary | ICD-10-CM

## 2022-02-04 ENCOUNTER — Encounter: Payer: Self-pay | Admitting: Family Medicine

## 2022-02-04 ENCOUNTER — Other Ambulatory Visit: Payer: Self-pay

## 2022-02-04 ENCOUNTER — Ambulatory Visit: Payer: Medicare PPO | Admitting: Family Medicine

## 2022-02-04 VITALS — BP 126/80 | HR 72 | Temp 98.1°F | Ht 68.0 in | Wt 258.4 lb

## 2022-02-04 DIAGNOSIS — K760 Fatty (change of) liver, not elsewhere classified: Secondary | ICD-10-CM | POA: Diagnosis not present

## 2022-02-04 DIAGNOSIS — E119 Type 2 diabetes mellitus without complications: Secondary | ICD-10-CM

## 2022-02-04 DIAGNOSIS — E039 Hypothyroidism, unspecified: Secondary | ICD-10-CM

## 2022-02-04 DIAGNOSIS — R030 Elevated blood-pressure reading, without diagnosis of hypertension: Secondary | ICD-10-CM | POA: Diagnosis not present

## 2022-02-04 DIAGNOSIS — D696 Thrombocytopenia, unspecified: Secondary | ICD-10-CM

## 2022-02-04 DIAGNOSIS — Z9189 Other specified personal risk factors, not elsewhere classified: Secondary | ICD-10-CM

## 2022-02-04 DIAGNOSIS — Z6835 Body mass index (BMI) 35.0-35.9, adult: Secondary | ICD-10-CM | POA: Diagnosis not present

## 2022-02-04 LAB — COMPREHENSIVE METABOLIC PANEL
ALT: 30 U/L (ref 0–35)
AST: 36 U/L (ref 0–37)
Albumin: 3.9 g/dL (ref 3.5–5.2)
Alkaline Phosphatase: 83 U/L (ref 39–117)
BUN: 13 mg/dL (ref 6–23)
CO2: 26 mEq/L (ref 19–32)
Calcium: 8.8 mg/dL (ref 8.4–10.5)
Chloride: 106 mEq/L (ref 96–112)
Creatinine, Ser: 0.83 mg/dL (ref 0.40–1.20)
GFR: 72.62 mL/min (ref >60.00)
Glucose, Bld: 149 mg/dL — ABNORMAL HIGH (ref 70–99)
Potassium: 4.3 mEq/L (ref 3.5–5.1)
Sodium: 139 mEq/L (ref 135–145)
Total Bilirubin: 0.6 mg/dL (ref 0.2–1.2)
Total Protein: 6.8 g/dL (ref 6.0–8.3)

## 2022-02-04 LAB — CBC WITH DIFFERENTIAL/PLATELET
Basophils Absolute: 0.1 10*3/uL (ref 0.0–0.1)
Basophils Relative: 1 % (ref 0.0–3.0)
Eosinophils Absolute: 0.2 10*3/uL (ref 0.0–0.7)
Eosinophils Relative: 2.6 % (ref 0.0–5.0)
HCT: 37.9 % (ref 36.0–46.0)
Hemoglobin: 12 g/dL (ref 12.0–15.0)
Lymphocytes Relative: 33 % (ref 12.0–46.0)
Lymphs Abs: 2 10*3/uL (ref 0.7–4.0)
MCHC: 31.8 g/dL (ref 30.0–36.0)
MCV: 74.9 fl — ABNORMAL LOW (ref 78.0–100.0)
Monocytes Absolute: 0.6 10*3/uL (ref 0.1–1.0)
Monocytes Relative: 9.5 % (ref 3.0–12.0)
Neutro Abs: 3.3 10*3/uL (ref 1.4–7.7)
Neutrophils Relative %: 53.9 % (ref 43.0–77.0)
Platelets: 151 10*3/uL (ref 150.0–400.0)
RBC: 5.06 Mil/uL (ref 3.87–5.11)
RDW: 17.3 % — ABNORMAL HIGH (ref 11.5–15.5)
WBC: 6.2 10*3/uL (ref 4.0–10.5)

## 2022-02-04 LAB — LIPID PANEL
Cholesterol: 107 mg/dL (ref 0–200)
HDL: 62.8 mg/dL (ref >39.00)
LDL Cholesterol: 30 mg/dL (ref 0–99)
NonHDL: 43.94
Total CHOL/HDL Ratio: 2
Triglycerides: 71 mg/dL (ref 0.0–149.0)
VLDL: 14.2 mg/dL (ref 0.0–40.0)

## 2022-02-04 LAB — POCT GLYCOSYLATED HEMOGLOBIN (HGB A1C): Hemoglobin A1C: 7.1 % — AB (ref 4.0–5.6)

## 2022-02-04 MED ORDER — OMEPRAZOLE 20 MG PO CPDR: 20.0000 mg | DELAYED_RELEASE_CAPSULE | Freq: Every day | ORAL | 0 refills | Status: DC

## 2022-02-04 NOTE — Patient Instructions (Signed)
Please return in 3 months for your annual complete physical; please come fasting.   I will release your lab results to you on your MyChart account with further instructions. You may see the results before I do, but when I review them I will send you a message with my report or have my assistant call you if things need to be discussed. Please reply to my message with any questions. Thank you!   I am so sorry for your loss. I will send out positivity for you and your family.   If you have any questions or concerns, please don't hesitate to send me a message via MyChart or call the office at [phone]. Thank you for visiting with [place] today! It's our pleasure caring for you.

## 2022-02-04 NOTE — Progress Notes (Signed)
Subjective  CC:  Chief Complaint  Patient presents with   Diabetes    HPI: Shelly [name] is a 66 y.o. female who presents to the office today for follow up of diabetes and problems listed above in the chief complaint.  Diabetes follow up: Has been diet controlled for the last 6 months.  Has been eating well.  Admits exercises less during the winter months.  No symptoms of hyperglycemia.  Eye exam up-to-date.  Immunizations up-to-date. [place] hypertension: Normal blood pressures at home.  No chest pain Weight is stable. History of thrombocytopenia, no bruising. Hypothyroidism on supplements without symptoms of high or low thyroid. Grief: Of note, her son's father-in-law passed away this morning.  She is appropriately tearful. Wt Readings from Last 3 Encounters:  02/04/22 258 lb 6.4 oz (117.2 kg)  06/20/21 255 lb (115.7 kg)  03/13/21 255 lb 3.2 oz (115.8 kg)    BP Readings from Last 3 Encounters:  02/04/22 126/80  06/20/21 122/86  03/13/21 124/82    Assessment  1. Diet-controlled diabetes mellitus ([place])   2. White coat syndrome without diagnosis of hypertension   3. Severe obesity (BMI 35.0-35.9 with comorbidity) ([place])   4. Thrombocytopenia (HCC) Chronic  5. Acquired hypothyroidism   6. Fatty liver   7. Candidate for statin therapy due to risk of future cardiovascular event      Plan  Diabetes is currently marginally controlled.  Worsening control.  Discussed diet.  Discussed starting a walking program daily.  Recheck 3 months.  Patient would like to avoid medications if possible.  Immunizations are up-to-date.  She is normotensive, not on ACE inhibitor: We will recheck urine microalbuminuria at next visit.  She remains on Crestor 5 mg nightly.  Tolerating well. Check lipids.  Fasting today on Crestor.  Check LFTs.  History of fatty liver Recheck CBC, monitoring platelets Recheck thyroid.  Patient is clinically euthyroid.  Follow up: 3 months for complete  physical and follow-up diabetes. Orders Placed This Encounter  Procedures   CBC with Differential/Platelet   Comprehensive metabolic panel   Lipid panel   TSH   POCT HgB A1C   No orders of the defined types were placed in this encounter.     Immunization History  Administered Date(s) Administered   Fluad Quad(high Dose 65+) 10/02/2021   Influenza Whole 10/09/2010   Influenza,inj,Quad PF,6+ Mos 09/20/2014, 09/29/2017, 10/05/2018, 09/15/2019   Influenza-Unspecified 10/02/2016, 09/29/2017   Moderna Covid-19 Vaccine Bivalent Booster 17yrs & up 09/18/2021   Moderna Sars-Covid-2 Vaccination 12/30/2019, 02/04/2020, 10/17/2020   Pneumococcal Conjugate-13 09/15/2019   Pneumococcal Polysaccharide-23 01/16/2018   Td 12/09/1998, 01/07/2011   Tdap 09/20/2014   Varicella Zoster Immune Globulin 10/07/2014   Zoster Recombinat (Shingrix) 01/16/2018, 10/05/2018   Zoster, Live 09/08/2014    Diabetes Related Lab Review: Lab Results  Component Value Date   HGBA1C 7.1 (A) 02/04/2022   HGBA1C 6.1 (A) 06/20/2021   HGBA1C 6.5 03/13/2021    Lab Results  Component Value Date   MICROALBUR <0.7 12/30/2019   Lab Results  Component Value Date   CREATININE 0.82 03/13/2021   BUN 13 03/13/2021   NA 139 03/13/2021   K 4.2 03/13/2021   CL 106 03/13/2021   CO2 25 03/13/2021   Lab Results  Component Value Date   CHOL 128 03/13/2021   CHOL 148 12/30/2019   CHOL 142 01/05/2019   Lab Results  Component Value Date   HDL 61.40 03/13/2021   HDL 62.30 12/30/2019   HDL 60.70  01/05/2019   Lab Results  Component Value Date   LDLCALC 48 03/13/2021   LDLCALC 64 12/30/2019   LDLCALC 61 01/05/2019   Lab Results  Component Value Date   TRIG 95.0 03/13/2021   TRIG 108.0 12/30/2019   TRIG 104.0 01/05/2019   Lab Results  Component Value Date   CHOLHDL 2 03/13/2021   CHOLHDL 2 12/30/2019   CHOLHDL 2 01/05/2019   No results found for: LDLDIRECT The ASCVD Risk score (Arnett DK, et al., 2019)  failed to calculate for the following reasons:   The valid total cholesterol range is 130 to 320 mg/dL I have reviewed the PMH, Fam and Soc history. Patient Active Problem List   Diagnosis Date Noted   Diet-controlled diabetes mellitus ([place]) 06/20/2021    Priority: High   White coat syndrome without diagnosis of hypertension 01/16/2018    Priority: High   Severe obesity (BMI 35.0-35.9 with comorbidity) ([place])     Priority: High   Acquired hypothyroidism 01/07/2011    Priority: High   Primary osteoarthritis of left knee 01/05/2019    Priority: Medium    Family history of colon cancer 01/16/2018    Priority: Medium     Grandparents, elderly    Family history of breast cancer 01/16/2018    Priority: Medium     Mom and MGM, 80s    GERD (gastroesophageal reflux disease) 06/02/2014    Priority: Medium    Irritable bowel syndrome 01/07/2011    Priority: Medium    Fatty liver 01/07/2011    Priority: Medium    Chronic allergic rhinitis 12/30/2019    Priority: Low   Thrombocytopenia ([place]) 01/07/2019    Priority: Low    Chronic by lab review    Candidate for statin therapy due to risk of future cardiovascular event 06/20/2021   Visit for review of DEXA scan 08/30/2020    Dexa 08/2020; normal. Recheck in 5 years     Social History: Patient  reports that she has never smoked. She has never used smokeless tobacco. She reports current alcohol use. She reports that she does not use drugs.  Review of Systems: Ophthalmic: negative for eye pain, loss of vision or double vision Cardiovascular: negative for chest pain Respiratory: negative for SOB or persistent cough Gastrointestinal: negative for abdominal pain Genitourinary: negative for dysuria or gross hematuria MSK: negative for foot lesions Neurologic: negative for weakness or gait disturbance  Objective  Vitals: BP 126/80    Pulse 72    Temp 98.1 F (36.7 C) (Temporal)    Ht 5\' 8"  (1.727 m)    Wt 258 lb 6.4 oz (117.2 kg)     SpO2 96%    BMI 39.29 kg/m  General: well appearing, no acute distress  HEENT:  Normocephalic, atraumatic, moist mucous membranes, supple neck  Cardiovascular:  Nl S1 and S2, RRR without murmur, gallop or rub. no edema Respiratory:  Good breath sounds bilaterally, CTAB with normal effort, no rales  Lab Results  Component Value Date   HGBA1C 7.1 (A) 02/04/2022   HGBA1C 6.1 (A) 06/20/2021   HGBA1C 6.5 03/13/2021        Diabetic education: ongoing education regarding chronic disease management for diabetes was given today. We continue to reinforce the ABC's of diabetic management: A1c (<7 or 8 dependent upon patient), tight blood pressure control, and cholesterol management with goal LDL < 100 minimally. We discuss diet strategies, exercise recommendations, medication options and possible side effects. At each visit, we review recommended immunizations  and preventive care recommendations for diabetics and stress that good diabetic control can prevent other problems. See below for this patient's data.   Commons side effects, risks, benefits, and alternatives for medications and treatment plan prescribed today were discussed, and the patient expressed understanding of the given instructions. Patient is instructed to call or message via MyChart if he/she has any questions or concerns regarding our treatment plan. No barriers to understanding were identified. We discussed Red Flag symptoms and signs in detail. Patient expressed understanding regarding what to do in case of urgent or emergency type symptoms.  Medication list was reconciled, printed and provided to the patient in AVS. Patient instructions and summary information was reviewed with the patient as documented in the AVS. This note was prepared with assistance of Dragon voice recognition software. Occasional wrong-word or sound-a-like substitutions may have occurred due to the inherent limitations of voice recognition software  This visit  occurred during the SARS-CoV-2 public health emergency.  Safety protocols were in place, including screening questions prior to the visit, additional usage of staff PPE, and extensive cleaning of exam room while observing appropriate contact time as indicated for disinfecting solutions.

## 2022-02-05 LAB — TSH: TSH: 2.84 u[IU]/mL (ref 0.35–5.50)

## 2022-03-14 ENCOUNTER — Encounter: Payer: Medicare PPO | Admitting: Family Medicine

## 2022-04-24 ENCOUNTER — Other Ambulatory Visit (INDEPENDENT_AMBULATORY_CARE_PROVIDER_SITE_OTHER): Payer: Self-pay | Admitting: Internal Medicine

## 2022-04-24 ENCOUNTER — Other Ambulatory Visit: Payer: Self-pay | Admitting: Family Medicine

## 2022-04-24 DIAGNOSIS — K589 Irritable bowel syndrome without diarrhea: Secondary | ICD-10-CM

## 2022-04-24 DIAGNOSIS — E039 Hypothyroidism, unspecified: Secondary | ICD-10-CM

## 2022-04-24 DIAGNOSIS — K219 Gastro-esophageal reflux disease without esophagitis: Secondary | ICD-10-CM

## 2022-05-10 ENCOUNTER — Telehealth (INDEPENDENT_AMBULATORY_CARE_PROVIDER_SITE_OTHER): Payer: Self-pay | Admitting: *Deleted

## 2022-05-10 NOTE — Telephone Encounter (Signed)
Patient called because she needs refill on hysocamine. Last ov was 2020 and had TCS 09/20/20. I did let her know she would need office visit. She has enough pills for 5 days and I let her know I would ask dr Laural Golden when he is back in office on Tuesday. She wanted me to just call her on Wednesday since she will be out of town on Tuesday.   [employer]  [phone]

## 2022-05-15 ENCOUNTER — Other Ambulatory Visit: Payer: Self-pay | Admitting: *Deleted

## 2022-05-15 DIAGNOSIS — K589 Irritable bowel syndrome without diarrhea: Secondary | ICD-10-CM

## 2022-05-15 MED ORDER — HYOSCYAMINE SULFATE 0.125 MG SL SUBL: SUBLINGUAL_TABLET | SUBLINGUAL | 2 refills | Status: DC

## 2022-05-15 NOTE — Telephone Encounter (Signed)
Ok per dr Laural Golden to give 3 refills and needs office visit. I called pt and let her know she needs visit and refills were sent

## 2022-05-17 ENCOUNTER — Encounter: Payer: Medicare PPO | Admitting: Family Medicine

## 2022-06-17 ENCOUNTER — Other Ambulatory Visit: Payer: Self-pay | Admitting: Family Medicine

## 2022-07-17 DIAGNOSIS — M7752 Other enthesopathy of left foot: Secondary | ICD-10-CM | POA: Diagnosis not present

## 2022-07-17 DIAGNOSIS — M7732 Calcaneal spur, left foot: Secondary | ICD-10-CM | POA: Diagnosis not present

## 2022-07-17 DIAGNOSIS — M7662 Achilles tendinitis, left leg: Secondary | ICD-10-CM | POA: Diagnosis not present

## 2022-07-17 DIAGNOSIS — M792 Neuralgia and neuritis, unspecified: Secondary | ICD-10-CM | POA: Diagnosis not present

## 2022-07-24 ENCOUNTER — Other Ambulatory Visit: Payer: Self-pay | Admitting: Family Medicine

## 2022-07-24 DIAGNOSIS — E039 Hypothyroidism, unspecified: Secondary | ICD-10-CM

## 2022-07-24 DIAGNOSIS — K219 Gastro-esophageal reflux disease without esophagitis: Secondary | ICD-10-CM

## 2022-08-07 ENCOUNTER — Ambulatory Visit (INDEPENDENT_AMBULATORY_CARE_PROVIDER_SITE_OTHER): Payer: Medicare PPO | Admitting: Family Medicine

## 2022-08-07 ENCOUNTER — Encounter: Payer: Self-pay | Admitting: Family Medicine

## 2022-08-07 VITALS — BP 116/70 | HR 69 | Temp 98.4°F | Ht 68.0 in | Wt 255.4 lb

## 2022-08-07 DIAGNOSIS — D696 Thrombocytopenia, unspecified: Secondary | ICD-10-CM | POA: Diagnosis not present

## 2022-08-07 DIAGNOSIS — K219 Gastro-esophageal reflux disease without esophagitis: Secondary | ICD-10-CM | POA: Diagnosis not present

## 2022-08-07 DIAGNOSIS — Z6835 Body mass index (BMI) 35.0-35.9, adult: Secondary | ICD-10-CM

## 2022-08-07 DIAGNOSIS — Z800 Family history of malignant neoplasm of digestive organs: Secondary | ICD-10-CM

## 2022-08-07 DIAGNOSIS — R030 Elevated blood-pressure reading, without diagnosis of hypertension: Secondary | ICD-10-CM | POA: Diagnosis not present

## 2022-08-07 DIAGNOSIS — Z0000 Encounter for general adult medical examination without abnormal findings: Secondary | ICD-10-CM

## 2022-08-07 DIAGNOSIS — K589 Irritable bowel syndrome without diarrhea: Secondary | ICD-10-CM

## 2022-08-07 DIAGNOSIS — E039 Hypothyroidism, unspecified: Secondary | ICD-10-CM

## 2022-08-07 DIAGNOSIS — K760 Fatty (change of) liver, not elsewhere classified: Secondary | ICD-10-CM

## 2022-08-07 DIAGNOSIS — E119 Type 2 diabetes mellitus without complications: Secondary | ICD-10-CM

## 2022-08-07 LAB — COMPREHENSIVE METABOLIC PANEL
ALT: 36 U/L — ABNORMAL HIGH (ref 0–35)
AST: 38 U/L — ABNORMAL HIGH (ref 0–37)
Albumin: 3.9 g/dL (ref 3.5–5.2)
Alkaline Phosphatase: 85 U/L (ref 39–117)
BUN: 12 mg/dL (ref 6–23)
CO2: 23 mEq/L (ref 19–32)
Calcium: 8.6 mg/dL (ref 8.4–10.5)
Chloride: 106 mEq/L (ref 96–112)
Creatinine, Ser: 0.74 mg/dL (ref 0.40–1.20)
GFR: 83.05 mL/min (ref >60.00)
Glucose, Bld: 121 mg/dL — ABNORMAL HIGH (ref 70–99)
Potassium: 4 mEq/L (ref 3.5–5.1)
Sodium: 138 mEq/L (ref 135–145)
Total Bilirubin: 0.6 mg/dL (ref 0.2–1.2)
Total Protein: 6.9 g/dL (ref 6.0–8.3)

## 2022-08-07 LAB — CBC WITH DIFFERENTIAL/PLATELET
Basophils Absolute: 0 10*3/uL (ref 0.0–0.1)
Basophils Relative: 0.7 % (ref 0.0–3.0)
Eosinophils Absolute: 0.2 10*3/uL (ref 0.0–0.7)
Eosinophils Relative: 2.6 % (ref 0.0–5.0)
HCT: 37.4 % (ref 36.0–46.0)
Hemoglobin: 11.8 g/dL — ABNORMAL LOW (ref 12.0–15.0)
Lymphocytes Relative: 33.1 % (ref 12.0–46.0)
Lymphs Abs: 1.9 10*3/uL (ref 0.7–4.0)
MCHC: 31.7 g/dL (ref 30.0–36.0)
MCV: 74 fl — ABNORMAL LOW (ref 78.0–100.0)
Monocytes Absolute: 0.7 10*3/uL (ref 0.1–1.0)
Monocytes Relative: 11.2 % (ref 3.0–12.0)
Neutro Abs: 3.1 10*3/uL (ref 1.4–7.7)
Neutrophils Relative %: 52.4 % (ref 43.0–77.0)
Platelets: 144 10*3/uL — ABNORMAL LOW (ref 150.0–400.0)
RBC: 5.05 Mil/uL (ref 3.87–5.11)
RDW: 18 % — ABNORMAL HIGH (ref 11.5–15.5)
WBC: 5.9 10*3/uL (ref 4.0–10.5)

## 2022-08-07 LAB — MICROALBUMIN / CREATININE URINE RATIO
Creatinine,U: 103.8 mg/dL
Microalb Creat Ratio: 1.1 mg/g (ref 0.0–30.0)
Microalb, Ur: 1.1 mg/dL (ref 0.0–1.9)

## 2022-08-07 LAB — LIPID PANEL
Cholesterol: 104 mg/dL (ref 0–200)
HDL: 64.4 mg/dL (ref >39.00)
LDL Cholesterol: 25 mg/dL (ref 0–99)
NonHDL: 39.79
Total CHOL/HDL Ratio: 2
Triglycerides: 73 mg/dL (ref 0.0–149.0)
VLDL: 14.6 mg/dL (ref 0.0–40.0)

## 2022-08-07 LAB — TSH: TSH: 3.17 u[IU]/mL (ref 0.35–5.50)

## 2022-08-07 LAB — HEMOGLOBIN A1C: Hgb A1c MFr Bld: 7.3 % — ABNORMAL HIGH (ref 4.6–6.5)

## 2022-08-07 NOTE — Progress Notes (Signed)
Subjective  Chief Complaint  Patient presents with   Annual Exam    Pt here for Annual exam and is currently fasting     HPI: Shelly [name] is a 66 y.o. female who presents to [facility] today for a Female Wellness Visit. She also has the concerns and/or needs as listed above in the chief complaint. These will be addressed in addition to the Health Maintenance Visit.   Wellness Visit: annual visit with health maintenance review and exam without Pap  HM; screens are current. Doing well. Weight is stable.  Reports she is trying intermittent fasting.  She has a very small lunch and then a very healthy dinner.  Working take is likely less than 1000 kcal/day.  She is unable to lose weight.  She would like to.  Exercise is limited due to dealing with Achilles tendinitis.  She is seeing podiatrist for this.  Eye exam is due in October, mammogram due in September. Chronic disease f/u and/or acute problem visit: (deemed necessary to be done in addition to the wellness visit): Diabetes: Last A1c in February was 7.1.  She continues to work on her diet.  She denies symptoms of hypoglycemia.  Due for recheck.  No retinopathy.  Due for urine nephropathy screen.  She is not on an ACE inhibitor.  She is normotensive.  She is on Crestor 5 mg daily.  She does need refill.  Tolerates this well. Active IBS and GERD symptoms: More acid burning symptoms.  Omeprazole 20 daily.  No blood in the stool.  No melena.  Believes intermittent fasting may be causing the problem but also was on meloxicam for tendinitis.  She since has stopped this. Hypothyroidism: No symptoms of high or low thyroid.  Maintained on levothyroxine100 mcg daily History of fatty liver: Monitoring. History of thrombocytopenia, idiopathic.  Monitoring.  No bruising.  Assessment  1. Annual physical exam   2. Diet-controlled diabetes mellitus ([place])   3. White coat syndrome without diagnosis of hypertension   4.  Severe obesity (BMI 35.0-35.9 with comorbidity) ([name])   5. Acquired hypothyroidism   6. Family history of colon cancer   7. Fatty liver   8. Thrombocytopenia ([name])   9. Irritable bowel syndrome, unspecified type   10. Gastroesophageal reflux disease without esophagitis      Plan  Female Wellness Visit: Age appropriate Health Maintenance and Prevention measures were discussed with patient. Included topics are cancer screening recommendations, ways to keep healthy (see AVS) including dietary and exercise recommendations, regular eye and dental care, use of seat belts, and avoidance of moderate alcohol use and tobacco use.  Mammogram to get scheduled. BMI: discussed patient's BMI and encouraged positive lifestyle modifications to help get to or maintain a target BMI. HM needs and immunizations were addressed and ordered. See below for orders. See HM and immunization section for updates.  Patient will get flu shot at pharmacy Routine labs and screening tests ordered including cmp, cbc and lipids where appropriate. Discussed recommendations regarding Vit D and calcium supplementation (see AVS)  Chronic disease management visit and/or acute problem visit: Diabetes: Check A1c.  If A1c is greater than 7, will start medications.  Discussed metformin trial, discussed GLP-1's to assist with weight loss.  Patient is agreeable.  She may not be able to tolerate metformin given her IBS.  We discussed this.  Check urine testing today.  Patient to schedule eye exam Hyperlipidemia on Crestor 5: Tolerates well.  Recheck lipids.  Fasting today.  Check LFTs.  History of fatty liver. GERD: Active, could be due to NSAID.  Recommend omeprazole 20 mg twice daily for 4 to 6 weeks, then back down to 20.  She will follow-up if symptoms are not improving. IBS on Levsin as needed.  Chronic.  No changes. Recheck thyroid on levothyroxine 100 mcg daily.  Has been well controlled.  Clinically euthyroid. Obesity: Discussed  diet.  She may be over restricting her calories.  Discussed increasing caloric intake, healthy proteins.  May be candidate for Ozempic given diabetes.  Follow up: 3 to 6 months to recheck diabetes, pending lab work Orders Placed This Encounter  Procedures   CBC with Differential/Platelet   Comprehensive metabolic panel   Hemoglobin A1c   Lipid panel   Microalbumin / creatinine urine ratio   TSH   No orders of the defined types were placed in this encounter.     Body mass index is 38.83 kg/m. Wt Readings from Last 3 Encounters:  08/07/22 255 lb 6.4 oz (115.8 kg)  02/04/22 258 lb 6.4 oz (117.2 kg)  06/20/21 255 lb (115.7 kg)     Patient Active Problem List   Diagnosis Date Noted   Diet-controlled diabetes mellitus ([name]) 06/20/2021    Priority: High   White coat syndrome without diagnosis of hypertension 01/16/2018    Priority: High   Severe obesity (BMI 35.0-35.9 with comorbidity) ([name])     Priority: High   Acquired hypothyroidism 01/07/2011    Priority: High   Primary osteoarthritis of left knee 01/05/2019    Priority: Medium    Family history of colon cancer 01/16/2018    Priority: Medium     Grandparents, elderly    Family history of breast cancer 01/16/2018    Priority: Medium     Mom and MGM, 80s    GERD (gastroesophageal reflux disease) 06/02/2014    Priority: Medium    Irritable bowel syndrome 01/07/2011    Priority: Medium    Fatty liver 01/07/2011    Priority: Medium    Chronic allergic rhinitis 12/30/2019    Priority: Low   Thrombocytopenia ([place]) 01/07/2019    Priority: Low    Chronic by lab review    Candidate for statin therapy due to risk of future cardiovascular event 06/20/2021   Visit for review of DEXA scan 08/30/2020    Dexa 08/2020; normal. Recheck in 5 years    Health Maintenance  Topic Date Due   HEMOGLOBIN A1C  08/04/2022   COVID-19 Vaccine (5 - Moderna series) 08/23/2022 (Originally 01/19/2022)   INFLUENZA VACCINE  03/09/2023  (Originally 07/09/2022)   MAMMOGRAM  09/07/2022   OPHTHALMOLOGY EXAM  09/19/2022   Pneumonia Vaccine 71+ Years old (3 - PPSV23 or PCV20) 01/16/2023   FOOT EXAM  08/08/2023   TETANUS/TDAP  09/20/2024   DEXA SCAN  08/29/2025   COLONOSCOPY (Pts 45-64yr Insurance coverage will need to be confirmed)  09/20/2025   Hepatitis C Screening  Completed   Zoster Vaccines- Shingrix  Completed   HPV VACCINES  Aged Out   Immunization History  Administered Date(s) Administered   Fluad Quad(high Dose 65+) 10/02/2021   Influenza Whole 10/09/2010   Influenza,inj,Quad PF,6+ Mos 09/20/2014, 09/29/2017, 10/05/2018, 09/15/2019   Influenza-Unspecified 10/02/2016, 09/29/2017   Moderna Covid-19 Vaccine Bivalent Booster 185yr& up 09/18/2021   Moderna Sars-Covid-2 Vaccination 12/30/2019, 02/04/2020, 10/17/2020   Pneumococcal Conjugate-13 09/15/2019   Pneumococcal Polysaccharide-23 01/16/2018   Td 12/09/1998, 01/07/2011   Tdap 09/20/2014   Varicella Zoster Immune  Globulin 10/07/2014   Zoster Recombinat (Shingrix) 01/16/2018, 10/05/2018   Zoster, Live 09/08/2014   We updated and reviewed the patient's past history in detail and it is documented below. Allergies: Patient is allergic to propoxyphene n-acetaminophen. Past Medical History Patient  has a past medical history of Cancer ([place]), Diabetes mellitus without complication ([place]), GERD (gastroesophageal reflux disease), Headache(784.0), Hypothyroidism, Irritable bowel syndrome (IBS), Primary osteoarthritis of left knee (01/05/2019), and Thrombopenia ([place]) (01/07/2019). Past Surgical History Patient  has a past surgical history that includes Abdominal hysterectomy; cyst removed; Flexible sigmoidoscopy (07/23/2011); Colonoscopy (N/A, 06/19/2015); Appendectomy (1991); Tonsillectomy and adenoidectomy; and Colonoscopy with propofol (N/A, 09/20/2020). Family History: Patient family history includes Atrial fibrillation (age of onset: 17) in her mother; Breast cancer  (age of onset: 50) in her maternal grandmother and mother; Colon cancer (age of onset: 74) in her maternal uncle; Colon cancer (age of onset: 57) in her maternal aunt. Social History:  Patient  reports that she has never smoked. She has never used smokeless tobacco. She reports current alcohol use. She reports that she does not use drugs.  Review of Systems: Constitutional: negative for fever or malaise Ophthalmic: negative for photophobia, double vision or loss of vision Cardiovascular: negative for chest pain, dyspnea on exertion, or new LE swelling Respiratory: negative for SOB or persistent cough Gastrointestinal: negative for abdominal pain, change in bowel habits or melena Genitourinary: negative for dysuria or gross hematuria, no abnormal uterine bleeding or disharge Musculoskeletal: negative for new gait disturbance or muscular weakness Integumentary: negative for new or persistent rashes, no breast lumps Neurological: negative for TIA or stroke symptoms Psychiatric: negative for SI or delusions Allergic/Immunologic: negative for hives  Patient Care Team    Relationship Specialty Notifications Start End  Leamon Arnt, MD PCP - General Family Medicine  01/16/18   Rogene Houston, MD  Gastroenterology  06/18/13   Druscilla Brownie, MD  Dermatology  06/18/13   Erle Crocker, MD Consulting Physician Orthopedic Surgery  01/05/19   Dr. Rona Ravens Consulting Physician Ophthalmology  01/05/19    Comment: in [place]    Objective  Vitals: BP 116/70   Pulse 69   Temp 98.4 F (36.9 C)   Ht '5\' 8"'$  (1.727 m)   Wt 255 lb 6.4 oz (115.8 kg)   SpO2 96%   BMI 38.83 kg/m  General:  Well developed, well nourished, no acute distress  Psych:  Alert and orientedx3, flat mood and affect HEENT:  Normocephalic, atraumatic, non-icteric sclera,  supple neck without adenopathy, mass or thyromegaly Cardiovascular:  Normal S1, S2, RRR without gallop, rub or murmur Respiratory:  Good breath sounds  bilaterally, CTAB with normal respiratory effort Gastrointestinal: normal bowel sounds, soft, non-tender, no noted masses. No HSM MSK: no deformities, contusions. Joints are without erythema or swelling.  Skin:  Warm, no rashes or suspicious lesions noted Neurologic:    Mental status is normal. CN 2-11 are normal. Gross motor and sensory exams are normal. Normal gait. No tremor Diabetic Foot Exam: Appearance - no lesions, ulcers or calluses Skin - no sigificant pallor or erythema Monofilament testing - sensitive bilaterally in following locations:  Right - Great toe, medial, central, lateral ball and posterior foot intact  Left - Great toe, medial, central, lateral ball and posterior foot intact Pulses - +2 distally bilaterally   Commons side effects, risks, benefits, and alternatives for medications and treatment plan prescribed today were discussed, and the patient expressed understanding of the given instructions. Patient is instructed to call or  message via MyChart if he/she has any questions or concerns regarding our treatment plan. No barriers to understanding were identified. We discussed Red Flag symptoms and signs in detail. Patient expressed understanding regarding what to do in case of urgent or emergency type symptoms.  Medication list was reconciled, printed and provided to the patient in AVS. Patient instructions and summary information was reviewed with the patient as documented in the AVS. This note was prepared with assistance of Dragon voice recognition software. Occasional wrong-word or sound-a-like substitutions may have occurred due to the inherent limitations of voice recognition software  This visit occurred during the SARS-CoV-2 public health emergency.  Safety protocols were in place, including screening questions prior to the visit, additional usage of staff PPE, and extensive cleaning of exam room while observing appropriate contact time as indicated for disinfecting  solutions.

## 2022-08-07 NOTE — Patient Instructions (Signed)
Please return in 3-6 months for diabetes recheck. I will clarify preference once lab results return.   I will release your lab results to you on your MyChart account with further instructions. You may see the results before I do, but when I review them I will send you a message with my report or have my assistant call you if things need to be discussed. Please reply to my message with any questions. Thank you!   Your mammogram is due in September and your eye exam is due in October.   If you have any questions or concerns, please don't hesitate to send me a message via MyChart or call the office at [phone]. Thank you for visiting with [place] today! It's our pleasure caring for you.

## 2022-08-13 ENCOUNTER — Other Ambulatory Visit: Payer: Self-pay

## 2022-08-13 DIAGNOSIS — E119 Type 2 diabetes mellitus without complications: Secondary | ICD-10-CM

## 2022-08-13 MED ORDER — METFORMIN HCL 500 MG PO TABS: 500.0000 mg | ORAL_TABLET | Freq: Two times a day (BID) | ORAL | 3 refills | Status: DC

## 2022-08-14 DIAGNOSIS — M792 Neuralgia and neuritis, unspecified: Secondary | ICD-10-CM | POA: Diagnosis not present

## 2022-08-14 DIAGNOSIS — M7662 Achilles tendinitis, left leg: Secondary | ICD-10-CM | POA: Diagnosis not present

## 2022-08-14 DIAGNOSIS — M7732 Calcaneal spur, left foot: Secondary | ICD-10-CM | POA: Diagnosis not present

## 2022-08-14 DIAGNOSIS — M7752 Other enthesopathy of left foot: Secondary | ICD-10-CM | POA: Diagnosis not present

## 2022-09-05 ENCOUNTER — Ambulatory Visit: Payer: Medicare PPO

## 2022-09-06 ENCOUNTER — Other Ambulatory Visit: Payer: Self-pay | Admitting: Family Medicine

## 2022-09-06 DIAGNOSIS — Z1231 Encounter for screening mammogram for malignant neoplasm of breast: Secondary | ICD-10-CM

## 2022-09-09 ENCOUNTER — Ambulatory Visit (INDEPENDENT_AMBULATORY_CARE_PROVIDER_SITE_OTHER): Payer: Medicare PPO

## 2022-09-09 VITALS — Wt 250.0 lb

## 2022-09-09 DIAGNOSIS — Z0000 Encounter for general adult medical examination without abnormal findings: Secondary | ICD-10-CM | POA: Diagnosis not present

## 2022-09-09 NOTE — Progress Notes (Addendum)
Virtual Visit via Telephone Note  I connected with  Shelly [name] on 09/09/22 at 11:00 AM EDT by telephone and verified that I am speaking with the correct person using two identifiers.  Medicare Annual Wellness visit completed telephonically due to Covid-19 pandemic.   Persons participating in this call: This Health Coach and this patient.   Location: Patient: home Provider: office    I discussed the limitations, risks, security and privacy concerns of performing an evaluation and management service by telephone and the availability of in person appointments. The patient expressed understanding and agreed to proceed.  Unable to perform video visit due to video visit attempted and failed and/or patient does not have video capability.   Some vital signs may be absent or patient reported.   Willette Brace, LPN   Subjective:   Shelly [name] is a 66 y.o. female who presents for Medicare Annual (Subsequent) preventive examination.  Review of Systems     Cardiac Risk Factors include: advanced age (>45mn, >>67women);obesity (BMI >30kg/m2);diabetes mellitus     Objective:    Today's Vitals   09/09/22 1105  Weight: 250 lb (113.4 kg)   Body mass index is 38.01 kg/m.     09/09/2022   11:11 AM 08/23/2021    9:37 AM 09/18/2020   11:00 AM 06/19/2015    7:39 AM  Advanced Directives  Does Patient Have a Medical Advance Directive? No No No No  Would patient like information on creating a medical advance directive? Yes (MAU/Ambulatory/Procedural Areas - Information given) Yes (MAU/Ambulatory/Procedural Areas - Information given) No - Patient declined No - patient declined information    Current Medications (verified) Outpatient Encounter Medications as of 09/09/2022  Medication Sig   Artificial Tear Solution (SOOTHE XP OP) Place 1 drop into both eyes daily as needed (dry eye).    fexofenadine (ALLEGRA) 180 MG tablet Take 180 mg by mouth daily.   hyoscyamine  (LEVSIN SL) 0.125 MG SL tablet PLACE 1 TABLET UNDER THETONGUE THREE TIMES DAILY AS NEEDED. NEEDS OFFICE VISIT   ibuprofen (ADVIL) 200 MG tablet Take 400 mg by mouth every 6 (six) hours as needed for headache.   levothyroxine (SYNTHROID) 100 MCG tablet TAKE 1 TABLET 30 MINS BEFORE BREAKFAST ON AN EMPTY STOMACH   metFORMIN (GLUCOPHAGE) 500 MG tablet Take 1 tablet (500 mg total) by mouth 2 (two) times daily with a meal.   omeprazole (PRILOSEC) 20 MG capsule TAKE 1 CAPSULE BY MOUTH ONCE DAILY.   rosuvastatin (CRESTOR) 5 MG tablet TAKE 1 TABLET BY MOUTH AT BEDTIME.   No facility-administered encounter medications on file as of 09/09/2022.    Allergies (verified) Propoxyphene n-acetaminophen   History: Past Medical History:  Diagnosis Date   [place]cer ([place]    Basal Cell    Diabetes mellitus without complication (HCC)    GERD (gastroesophageal reflux disease)    Headache(784.0)    Hypothyroidism    Irritable bowel syndrome (IBS)    Primary osteoarthritis of left knee 01/05/2019   Thrombopenia ([place] 01/07/2019   Chronic by lab review   Past Surgical History:  Procedure Laterality Date   ABDOMINAL HYSTERECTOMY     APPENDECTOMY  1991   COLONOSCOPY N/A 06/19/2015   Procedure: COLONOSCOPY;  Surgeon: NRogene Houston MD;  [facility];  Service: Endoscopy;  Laterality: N/A;  8:30-9:30   COLONOSCOPY WITH PROPOFOL N/A 09/20/2020   Procedure: COLONOSCOPY WITH PROPOFOL;  Surgeon: RRogene Houston MD;  [facility];  Service: Endoscopy;  Laterality: N/A;  1015   cyst removed     right arm   FLEXIBLE SIGMOIDOSCOPY  07/23/2011   Procedure: FLEXIBLE SIGMOIDOSCOPY;  Surgeon: Rogene Houston, MD;  [facility];  Service: Endoscopy;  Laterality: N/A;   TONSILLECTOMY AND ADENOIDECTOMY     Family History  Problem Relation Age of Onset   Breast cancer Mother 9   Atrial fibrillation Mother 49       s/p ablation   Breast cancer Maternal Grandmother 80   Colon cancer  Maternal Aunt 80   Colon cancer Maternal Uncle 79   Social History   Socioeconomic History   Marital status: Married    Spouse name: Not on file   Number of children: Not on file   Years of education: Not on file   Highest education level: Not on file  Occupational History   Not on file  Tobacco Use   Smoking status: Never   Smokeless tobacco: Never  Substance and Sexual Activity   Alcohol use: Yes    Comment: social   Drug use: No   Sexual activity: Yes    Birth control/protection: Surgical  Other Topics Concern   Not on file  Social History Narrative   Not on file   Social Determinants of Health   Financial Resource Strain: Low Risk  (09/09/2022)   Overall Financial Resource Strain (CARDIA)    Difficulty of Paying Living Expenses: Not hard at all  Food Insecurity: No Food Insecurity (09/09/2022)   Hunger Vital Sign    Worried About Running Out of Food in the Last Year: Never true    [place] in the Last Year: Never true  Transportation Needs: No Transportation Needs (09/09/2022)   PRAPARE - Hydrologist (Medical): No    Lack of Transportation (Non-Medical): No  Physical Activity: Inactive (09/09/2022)   Exercise Vital Sign    Days of Exercise per Week: 0 days    Minutes of Exercise per Session: 0 min  Stress: No Stress Concern Present (09/09/2022)   [place]    Feeling of Stress : Not at all  Social Connections: [place] (09/09/2022)   Social Connection and Isolation Panel [NHANES]    Frequency of Communication with Friends and Family: More than three times a week    Frequency of Social Gatherings with Friends and Family: More than three times a week    Attends Religious Services: More than 4 times per year    Active Member of [employer] or Organizations: Yes    Attends Archivist Meetings: 1 to 4 times per year    Marital Status: Married     Tobacco Counseling Counseling given: Not Answered   Clinical Intake:  Pre-visit preparation completed: Yes  Pain : No/denies pain     BMI - recorded: 38.01 Nutritional Status: BMI > 30  Obese Nutritional Risks: None Diabetes: Yes CBG done?: No Did pt. bring in CBG monitor from home?: No  How often do you need to have someone help you when you read instructions, pamphlets, or other written materials from your doctor or pharmacy?: 1 - Never  Diabetic?Nutrition Risk Assessment:  Has the patient had any N/V/D within the last 2 months?  No  Does the patient have any non-healing wounds?  No  Has the patient had any unintentional weight loss or weight gain?  Yes   Diabetes:  Is the patient diabetic?  Yes  If diabetic, was a CBG obtained today?  No  Did the patient bring in their glucometer from home?  No  How often do you monitor your CBG's? N/A.   Financial Strains and Diabetes Management:  Are you having any financial strains with the device, your supplies or your medication? No .  Does the patient want to be seen by Chronic Care Management for management of their diabetes?  No  Would the patient like to be referred to a Nutritionist or for Diabetic Management?  No   Diabetic Exams:  Diabetic Eye Exam: Completed 09/19/21 Diabetic Foot Exam: Completed 08/07/22   Interpreter Needed?: No  Information entered by :: Charlott Rakes, LPN   Activities of Daily Living    09/09/2022   11:13 AM  In your present state of health, do you have any difficulty performing the following activities:  Hearing? 0  Vision? 0  Difficulty concentrating or making decisions? 0  Walking or climbing stairs? 0  Dressing or bathing? 0  Doing errands, shopping? 0  Preparing Food and eating ? N  Using the Toilet? N  In the past six months, have you accidently leaked urine? Y  Comment at times wears a pad  Do you have problems with loss of bowel control? N  Managing your Medications?  N  Managing your Finances? N  Housekeeping or managing your Housekeeping? N    Patient Care Team: Leamon Arnt, MD as PCP - General (Family Medicine) Rogene Houston, MD (Gastroenterology) Druscilla Brownie, MD (Dermatology) Erle Crocker, MD as Consulting Physician (Orthopedic Surgery) Dr. Rona Ravens as Consulting Physician (Ophthalmology)  Indicate any recent Medical Services you may have received from other than [facility] providers in the past year (date may be approximate).     Assessment:   This is a routine wellness examination for [place].  Hearing/Vision screen Hearing Screening - Comments:: Pt denies any hearing issues  Vision Screening - Comments:: [facility] Dr Debe Coder [place] for annual eye exams   Dietary issues and exercise activities discussed: Current Exercise Habits: The patient does not participate in regular exercise at present   Goals Addressed             This Visit's Progress    Patient Stated       Lose weight        Depression Screen    09/09/2022   11:09 AM 08/07/2022    9:54 AM 08/23/2021    9:36 AM 03/13/2021    9:50 AM 10/13/2019   10:45 AM 01/05/2019    9:02 AM 04/07/2018    8:50 AM  PHQ 2/9 Scores  PHQ - 2 Score 1 0 0 0 0 0 0  PHQ- 9 Score       0    Fall Risk    09/09/2022   11:12 AM 08/07/2022    9:54 AM 08/23/2021    9:38 AM 03/13/2021    9:50 AM 10/13/2019   10:45 AM  Fall Risk   Falls in the past year? 0 0 0 0 1  Number falls in past yr: 0 0 0 0 0  Injury with Fall? 0 0 0 0 0  Risk for fall due to : Impaired vision No Fall Risks Impaired balance/gait;Impaired vision  History of fall(s)  Follow up Falls prevention discussed Falls evaluation completed Falls prevention discussed  Falls evaluation completed    FALL RISK PREVENTION PERTAINING TO THE HOME:  Any stairs in or around the home? Yes  If so, are there any without handrails? No  Home free of loose throw rugs in walkways, pet beds, electrical cords, etc? Yes  Adequate lighting  in your home to reduce risk of falls? Yes   ASSISTIVE DEVICES UTILIZED TO PREVENT FALLS:  Life alert? No  Use of a cane, walker or w/c? No  Grab bars in the bathroom? No  Shower chair or bench in shower? No  Elevated toilet seat or a handicapped toilet? No   TIMED UP AND GO:  Was the test performed? No .   Cognitive Function:        09/09/2022   11:14 AM 08/23/2021    9:40 AM  6CIT Screen  What Year? 0 points 0 points  What month? 0 points 0 points  What time? 0 points 0 points  Count back from 20 0 points 0 points  Months in reverse 0 points 0 points  Repeat phrase 0 points 0 points  Total Score 0 points 0 points    Immunizations Immunization History  Administered Date(s) Administered   Fluad Quad(high Dose 65+) 10/02/2021   Influenza Whole 10/09/2010   Influenza,inj,Quad PF,6+ Mos 09/20/2014, 09/29/2017, 10/05/2018, 09/15/2019   Influenza-Unspecified 10/02/2016, 09/29/2017   Moderna Covid-19 Vaccine Bivalent Booster 16yr & up 09/18/2021   Moderna Sars-Covid-2 Vaccination 12/30/2019, 02/04/2020, 10/17/2020   Pneumococcal Conjugate-13 09/15/2019   Pneumococcal Polysaccharide-23 01/16/2018   Td 12/09/1998, 01/07/2011   Tdap 09/20/2014   Varicella Zoster Immune Globulin 10/07/2014   Zoster Recombinat (Shingrix) 01/16/2018, 10/05/2018   Zoster, Live 09/08/2014    TDAP status: Up to date  Flu Vaccine status: Due, Education has been provided regarding the importance of this vaccine. Advised may receive this vaccine at local pharmacy or Health Dept. Aware to provide a copy of the vaccination record if obtained from local pharmacy or Health Dept. Verbalized acceptance and understanding.  Pneumococcal vaccine status: Up to date  Covid-19 vaccine status: Completed vaccines  Qualifies for Shingles Vaccine? Yes   Zostavax completed Yes   Shingrix Completed?: Yes  Screening Tests Health Maintenance  Topic Date Due   COVID-19 Vaccine (5 - Moderna series) 01/19/2022    MAMMOGRAM  09/07/2022   INFLUENZA VACCINE  03/09/2023 (Originally 07/09/2022)   OPHTHALMOLOGY EXAM  09/19/2022   Pneumonia Vaccine 68 Years old (3 - PPSV23 or PCV20) 01/16/2023   HEMOGLOBIN A1C  02/06/2023   Diabetic kidney evaluation - GFR measurement  08/08/2023   Diabetic kidney evaluation - Urine ACR  08/08/2023   FOOT EXAM  08/08/2023   TETANUS/TDAP  09/20/2024   DEXA SCAN  08/29/2025   COLONOSCOPY (Pts 45-438yrInsurance coverage will need to be confirmed)  09/20/2025   Hepatitis C Screening  Completed   Zoster Vaccines- Shingrix  Completed   HPV VACCINES  Aged Out    Health Maintenance  Health Maintenance Due  Topic Date Due   COVID-19 Vaccine (5 - Moderna series) 01/19/2022   MAMMOGRAM  09/07/2022    Colorectal cancer screening: Type of screening: Colonoscopy. Completed 09/20/20. Repeat every 5 years  Mammogram status: Completed 09/07/21 . Repeat every year scheduled 10/07/22  Bone Density status: Completed 08/29/20. Results reflect: Bone density results: NORMAL. Repeat every 5 years.   Additional Screening:  Hepatitis C Screening:  Completed 01/05/19  Vision Screening: Recommended annual ophthalmology exams for early detection of glaucoma and other disorders of the eye. Is the patient up to date with their annual eye exam?  Yes  Who is the provider or what is the name of  the office in which the patient attends annual eye exams? [facility] [place]   If pt is not established with a provider, would they like to be referred to a provider to establish care? No .   Dental Screening: Recommended annual dental exams for proper oral hygiene  Community Resource Referral / Chronic Care Management: CRR required this visit?  No   CCM required this visit?  No      Plan:     I have personally reviewed and noted the following in the patient's chart:   Medical and social history Use of alcohol, tobacco or illicit drugs  Current medications and supplements including opioid  prescriptions. Patient is not currently taking opioid prescriptions. Functional ability and status Nutritional status Physical activity Advanced directives List of other physicians Hospitalizations, surgeries, and ER visits in previous 12 months Vitals Screenings to include cognitive, depression, and falls Referrals and appointments  In addition, I have reviewed and discussed with patient certain preventive protocols, quality metrics, and best practice recommendations. A written personalized care plan for preventive services as well as general preventive health recommendations were provided to patient.     Willette Brace, LPN   06/13/2262   Nurse Notes: none

## 2022-09-09 NOTE — Patient Instructions (Signed)
Shelly [name] , Thank you for taking time to come for your Medicare Wellness Visit. I appreciate your ongoing commitment to your health goals. Please review the following plan we discussed and let me know if I can assist you in the future.   These are the goals we discussed:  Goals      Patient Stated     Lose weight      Patient Stated     Lose weight         This is a list of the screening recommended for you and due dates:  Health Maintenance  Topic Date Due   COVID-19 Vaccine (5 - Moderna series) 01/19/2022   Mammogram  09/07/2022   Flu Shot  03/09/2023*   Eye exam for diabetics  09/19/2022   Pneumonia Vaccine (3 - PPSV23 or PCV20) 01/16/2023   Hemoglobin A1C  02/06/2023   Yearly kidney function blood test for diabetes  08/08/2023   Yearly kidney health urinalysis for diabetes  08/08/2023   Complete foot exam   08/08/2023   Tetanus Vaccine  09/20/2024   DEXA scan (bone density measurement)  08/29/2025   Colon Cancer Screening  09/20/2025   Hepatitis C Screening: USPSTF Recommendation to screen - Ages 18-79 yo.  Completed   Zoster (Shingles) Vaccine  Completed   HPV Vaccine  Aged Out  *Topic was postponed. The date shown is not the original due date.    Advanced directives: Advance directive discussed with you today. I have provided a copy for you to complete at home and have notarized. Once this is complete please bring a copy in to our office so we can scan it into your chart.  Conditions/risks identified: lose weight   Next appointment: Follow up in one year for your annual wellness visit    Preventive Care 65 Years and Older, Female Preventive care refers to lifestyle choices and visits with your health care provider that can promote health and wellness. What does preventive care include? A yearly physical exam. This is also called an annual well check. Dental exams once or twice a year. Routine eye exams. Ask your health care provider how often you should have  your eyes checked. Personal lifestyle choices, including: Daily care of your teeth and gums. Regular physical activity. Eating a healthy diet. Avoiding tobacco and drug use. Limiting alcohol use. Practicing safe sex. Taking low-dose aspirin every day. Taking vitamin and mineral supplements as recommended by your health care provider. What happens during an annual well check? The services and screenings done by your health care provider during your annual well check will depend on your age, overall health, lifestyle risk factors, and family history of disease. Counseling  Your health care provider may ask you questions about your: Alcohol use. Tobacco use. Drug use. Emotional well-being. Home and relationship well-being. Sexual activity. Eating habits. History of falls. Memory and ability to understand (cognition). Work and work Statistician. Reproductive health. Screening  You may have the following tests or measurements: Height, weight, and BMI. Blood pressure. Lipid and cholesterol levels. These may be checked every 5 years, or more frequently if you are over 65 years old. Skin check. Lung cancer screening. You may have this screening every year starting at age 67 if you have a 30-pack-year history of smoking and currently smoke or have quit within the past 15 years. Fecal occult blood test (FOBT) of the stool. You may have this test every year starting at age 67. Flexible sigmoidoscopy or colonoscopy. You  may have a sigmoidoscopy every 5 years or a colonoscopy every 10 years starting at age 24. Hepatitis C blood test. Hepatitis B blood test. Sexually transmitted disease (STD) testing. Diabetes screening. This is done by checking your blood sugar (glucose) after you have not eaten for a while (fasting). You may have this done every 1-3 years. Bone density scan. This is done to screen for osteoporosis. You may have this done starting at age 25. Mammogram. This may be done every  1-2 years. Talk to your health care provider about how often you should have regular mammograms. Talk with your health care provider about your test results, treatment options, and if necessary, the need for more tests. Vaccines  Your health care provider may recommend certain vaccines, such as: Influenza vaccine. This is recommended every year. Tetanus, diphtheria, and acellular pertussis (Tdap, Td) vaccine. You may need a Td booster every 10 years. Zoster vaccine. You may need this after age 61. Pneumococcal 13-valent conjugate (PCV13) vaccine. One dose is recommended after age 89. Pneumococcal polysaccharide (PPSV23) vaccine. One dose is recommended after age 52. Talk to your health care provider about which screenings and vaccines you need and how often you need them. This information is not intended to replace advice given to you by your health care provider. Make sure you discuss any questions you have with your health care provider. Document Released: 12/22/2015 Document Revised: 08/14/2016 Document Reviewed: 09/26/2015 Elsevier Interactive Patient Education  2017 [place] Prevention in the Home Falls can cause injuries. They can happen to people of all ages. There are many things you can do to make your home safe and to help prevent falls. What can I do on the outside of my home? Regularly fix the edges of walkways and driveways and fix any cracks. Remove anything that might make you trip as you walk through a door, such as a raised step or threshold. Trim any bushes or trees on the path to your home. Use bright outdoor lighting. Clear any walking paths of anything that might make someone trip, such as rocks or tools. Regularly check to see if handrails are loose or broken. Make sure that both sides of any steps have handrails. Any raised decks and porches should have guardrails on the edges. Have any leaves, snow, or ice cleared regularly. Use sand or salt on walking  paths during winter. Clean up any spills in your garage right away. This includes oil or grease spills. What can I do in the bathroom? Use night lights. Install grab bars by the toilet and in the tub and shower. Do not use towel bars as grab bars. Use non-skid mats or decals in the tub or shower. If you need to sit down in the shower, use a plastic, non-slip stool. Keep the floor dry. Clean up any water that spills on the floor as soon as it happens. Remove soap buildup in the tub or shower regularly. Attach bath mats securely with double-sided non-slip rug tape. Do not have throw rugs and other things on the floor that can make you trip. What can I do in the bedroom? Use night lights. Make sure that you have a light by your bed that is easy to reach. Do not use any sheets or blankets that are too big for your bed. They should not hang down onto the floor. Have a firm chair that has side arms. You can use this for support while you get dressed. Do not have throw  rugs and other things on the floor that can make you trip. What can I do in the kitchen? Clean up any spills right away. Avoid walking on wet floors. Keep items that you use a lot in easy-to-reach places. If you need to reach something above you, use a strong step stool that has a grab bar. Keep electrical cords out of the way. Do not use floor polish or wax that makes floors slippery. If you must use wax, use non-skid floor wax. Do not have throw rugs and other things on the floor that can make you trip. What can I do with my stairs? Do not leave any items on the stairs. Make sure that there are handrails on both sides of the stairs and use them. Fix handrails that are broken or loose. Make sure that handrails are as long as the stairways. Check any carpeting to make sure that it is firmly attached to the stairs. Fix any carpet that is loose or worn. Avoid having throw rugs at the top or bottom of the stairs. If you do have  throw rugs, attach them to the floor with carpet tape. Make sure that you have a light switch at the top of the stairs and the bottom of the stairs. If you do not have them, ask someone to add them for you. What else can I do to help prevent falls? Wear shoes that: Do not have high heels. Have rubber bottoms. Are comfortable and fit you well. Are closed at the toe. Do not wear sandals. If you use a stepladder: Make sure that it is fully opened. Do not climb a closed stepladder. Make sure that both sides of the stepladder are locked into place. Ask someone to hold it for you, if possible. Clearly mark and make sure that you can see: Any grab bars or handrails. First and last steps. Where the edge of each step is. Use tools that help you move around (mobility aids) if they are needed. These include: Canes. Walkers. Scooters. Crutches. Turn on the lights when you go into a dark area. Replace any light bulbs as soon as they burn out. Set up your furniture so you have a clear path. Avoid moving your furniture around. If any of your floors are uneven, fix them. If there are any pets around you, be aware of where they are. Review your medicines with your doctor. Some medicines can make you feel dizzy. This can increase your chance of falling. Ask your doctor what other things that you can do to help prevent falls. This information is not intended to replace advice given to you by your health care provider. Make sure you discuss any questions you have with your health care provider. Document Released: 09/21/2009 Document Revised: 05/02/2016 Document Reviewed: 12/30/2014 Elsevier Interactive Patient Education  2017 [employer].

## 2022-09-12 ENCOUNTER — Telehealth: Payer: Self-pay | Admitting: Family Medicine

## 2022-09-12 ENCOUNTER — Other Ambulatory Visit: Payer: Self-pay | Admitting: Family Medicine

## 2022-09-12 DIAGNOSIS — K219 Gastro-esophageal reflux disease without esophagitis: Secondary | ICD-10-CM

## 2022-09-12 NOTE — Telephone Encounter (Signed)
Patient requests RX with a dosage increase to 40 mg tablet of Omeprazole be sent to:  [place], [place] Phone: [phone]  Fax: [phone]

## 2022-09-13 ENCOUNTER — Other Ambulatory Visit: Payer: Self-pay | Admitting: Family Medicine

## 2022-09-13 DIAGNOSIS — K219 Gastro-esophageal reflux disease without esophagitis: Secondary | ICD-10-CM

## 2022-09-13 MED ORDER — OMEPRAZOLE 20 MG PO CPDR: 40.0000 mg | DELAYED_RELEASE_CAPSULE | Freq: Every day | ORAL | 3 refills | Status: DC

## 2022-09-13 NOTE — Telephone Encounter (Signed)
Pt states: -Rx shows 20 mg/day, but she takes '40mg'$ /day per verbal instructions during an OV. -Leaving town "this weekend" -will run out 10/12  omeprazole (PRILOSEC) 20 MG capsule [[id number]]   [place], [place] - [place] [address] [place], [place] [address] Phone: [phone]  Fax: [phone]

## 2022-09-15 IMAGING — MG DIGITAL SCREENING BILAT W/ TOMO W/ CAD
6 of 10 series · 6 of 30 positions shown · non-contrast
Comparison: Previous exam(s).

ACR Breast Density Category a: The breast tissue is almost entirely
fatty.

CLINICAL DATA: Screening.

EXAM:
DIGITAL SCREENING BILATERAL MAMMOGRAM WITH TOMO AND CAD

[R MLO synth-2D]
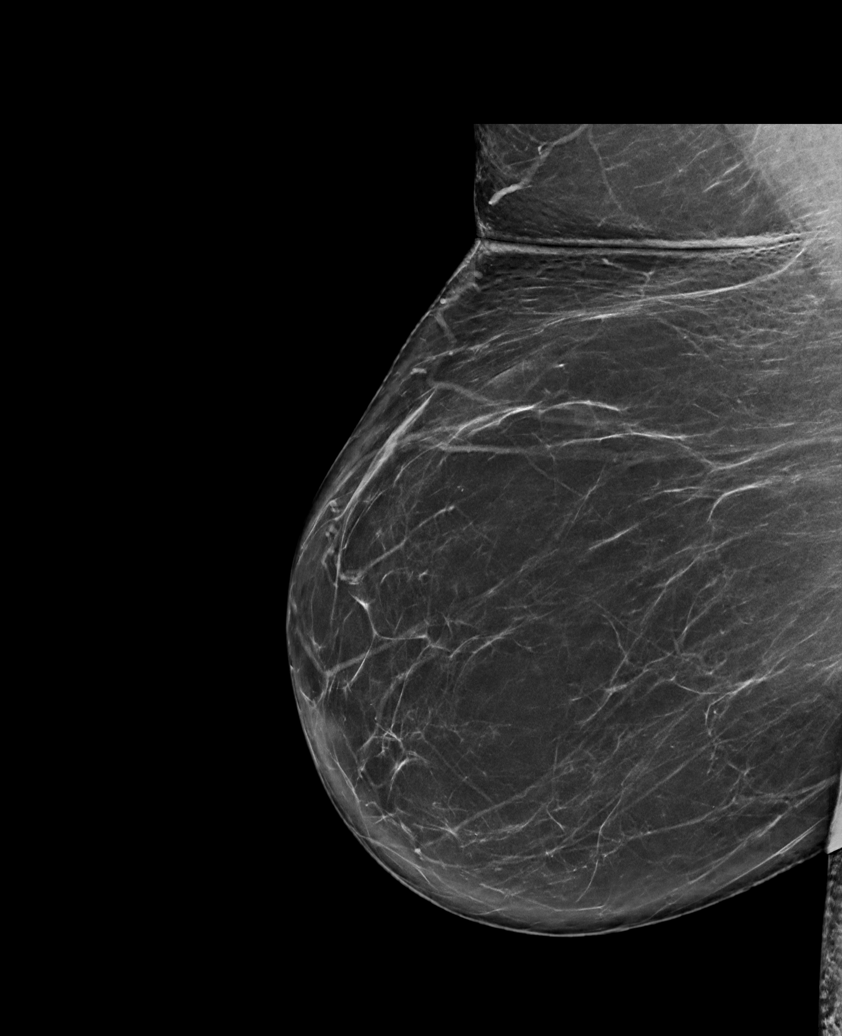

[L CC synth-2D]
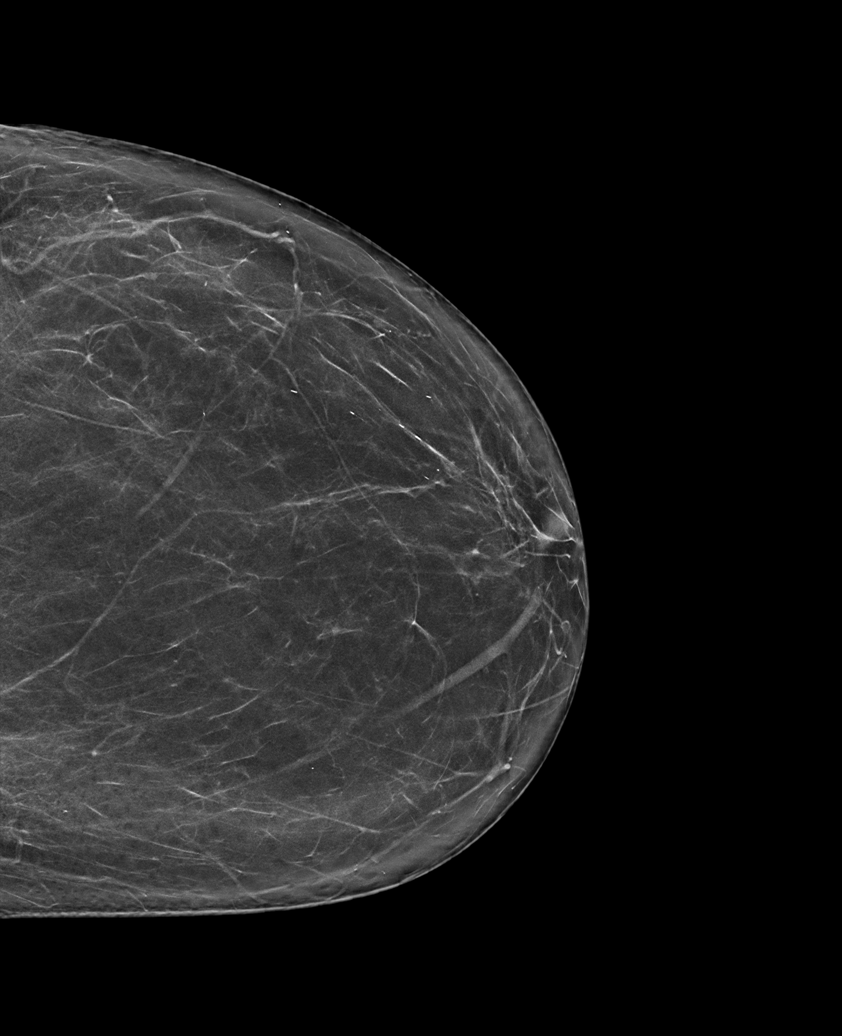

[L MLO synth-2D (1 of 2)]
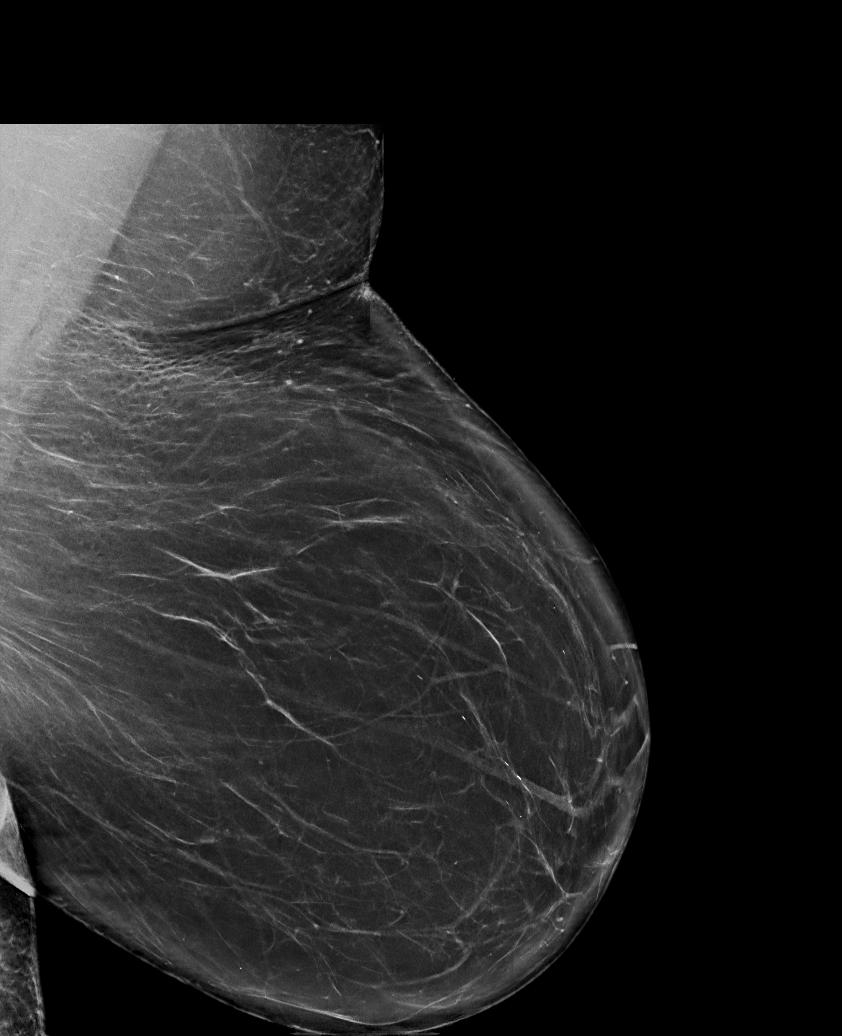

[R CC synth-2D]
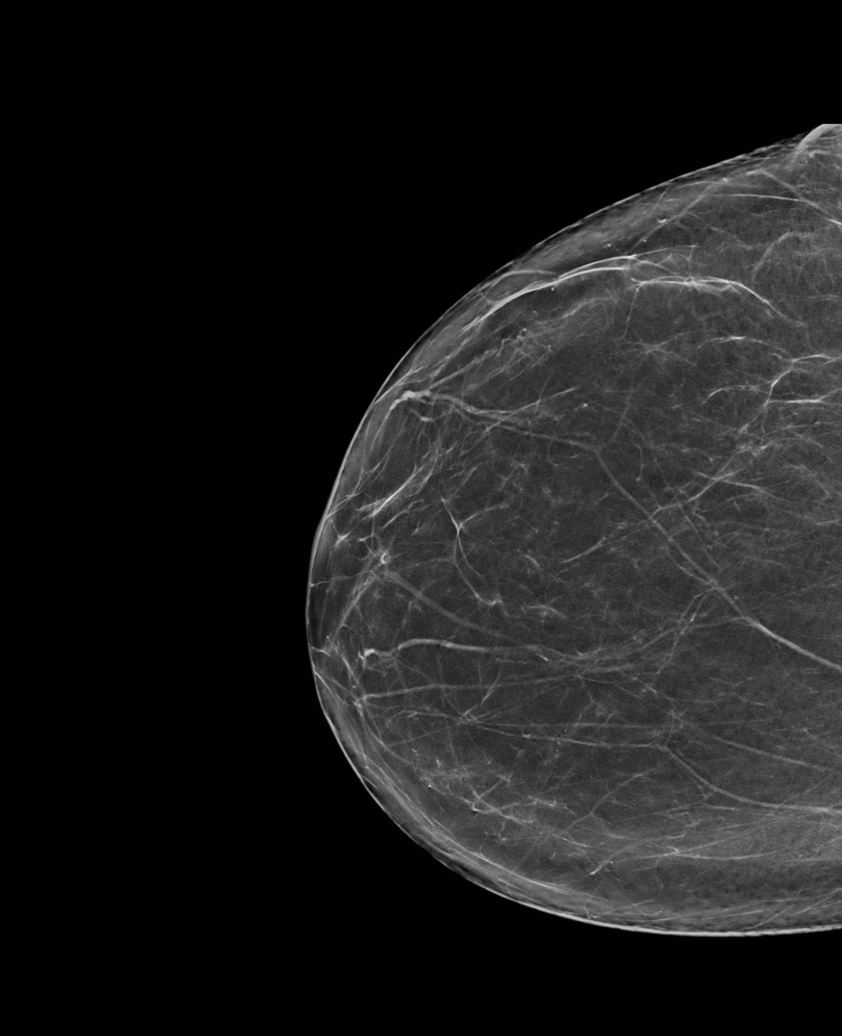

[L MLO synth-2D (2 of 2)]
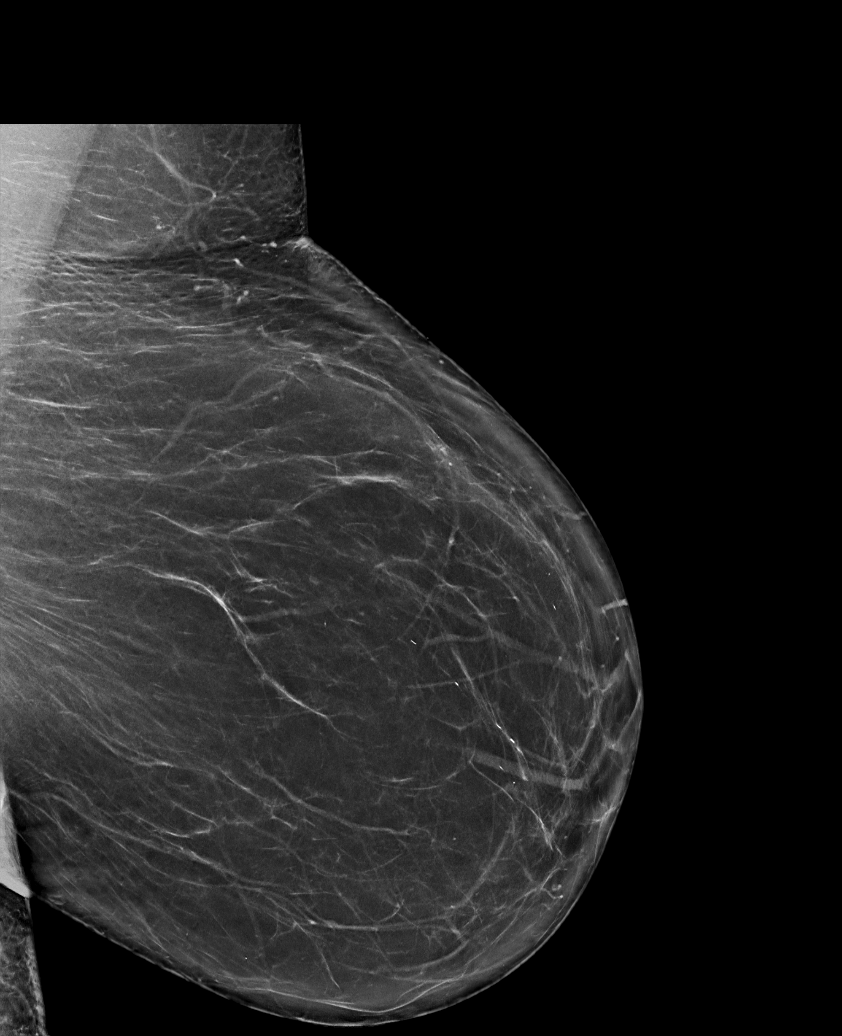

[R MLO tomo · tomo slice 43/86.0]
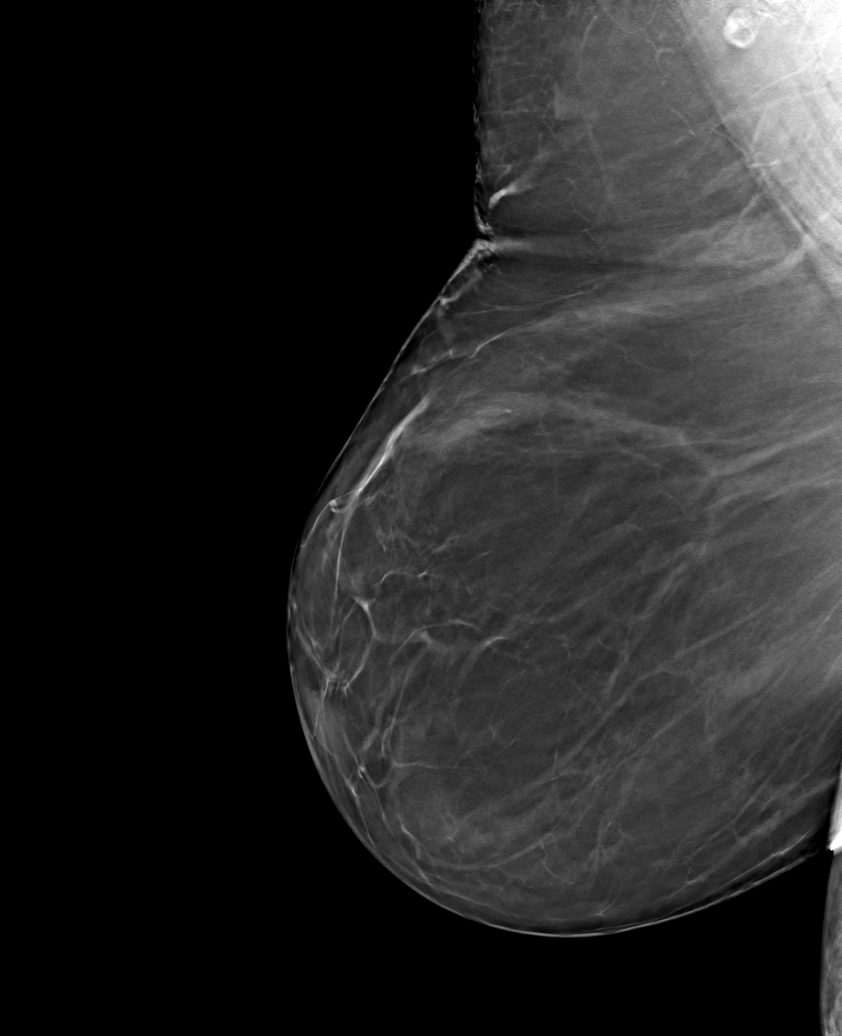

[6 of 30 positions shown; findings below may reference images not displayed]

FINDINGS: There are no findings suspicious for malignancy. Images were
processed with CAD.
IMPRESSION: No mammographic evidence of malignancy. A result letter of this
screening mammogram will be mailed directly to the patient.

RECOMMENDATION:
Screening mammogram in one year. (Code:[id number])

BI-RADS CATEGORY  1: Negative.

## 2022-09-27 DIAGNOSIS — M792 Neuralgia and neuritis, unspecified: Secondary | ICD-10-CM | POA: Diagnosis not present

## 2022-09-27 DIAGNOSIS — M7752 Other enthesopathy of left foot: Secondary | ICD-10-CM | POA: Diagnosis not present

## 2022-09-27 DIAGNOSIS — M7732 Calcaneal spur, left foot: Secondary | ICD-10-CM | POA: Diagnosis not present

## 2022-09-27 DIAGNOSIS — M7662 Achilles tendinitis, left leg: Secondary | ICD-10-CM | POA: Diagnosis not present

## 2022-10-07 ENCOUNTER — Ambulatory Visit
Admission: RE | Admit: 2022-10-07 | Discharge: 2022-10-07 | Disposition: A | Discharge status: Home / Self Care | Payer: Medicare PPO | Source: Ambulatory Visit | Attending: Family Medicine | Admitting: Family Medicine

## 2022-10-07 DIAGNOSIS — Z1231 Encounter for screening mammogram for malignant neoplasm of breast: Secondary | ICD-10-CM

## 2022-10-08 ENCOUNTER — Encounter: Payer: Self-pay | Admitting: Family Medicine

## 2022-10-19 ENCOUNTER — Encounter (INDEPENDENT_AMBULATORY_CARE_PROVIDER_SITE_OTHER): Payer: Self-pay | Admitting: Gastroenterology

## 2022-10-23 ENCOUNTER — Other Ambulatory Visit: Payer: Self-pay | Admitting: Family Medicine

## 2022-10-23 DIAGNOSIS — E039 Hypothyroidism, unspecified: Secondary | ICD-10-CM

## 2022-10-29 LAB — HM DIABETES EYE EXAM

## 2022-11-12 ENCOUNTER — Encounter: Payer: Self-pay | Admitting: Family Medicine

## 2022-11-12 ENCOUNTER — Ambulatory Visit: Payer: Medicare PPO | Admitting: Family Medicine

## 2022-11-12 VITALS — BP 122/70 | HR 71 | Temp 98.7°F | Ht 68.0 in | Wt 256.4 lb

## 2022-11-12 DIAGNOSIS — D696 Thrombocytopenia, unspecified: Secondary | ICD-10-CM

## 2022-11-12 DIAGNOSIS — Z79899 Other long term (current) drug therapy: Secondary | ICD-10-CM | POA: Diagnosis not present

## 2022-11-12 DIAGNOSIS — K760 Fatty (change of) liver, not elsewhere classified: Secondary | ICD-10-CM | POA: Diagnosis not present

## 2022-11-12 DIAGNOSIS — D509 Iron deficiency anemia, unspecified: Secondary | ICD-10-CM | POA: Diagnosis not present

## 2022-11-12 DIAGNOSIS — E039 Hypothyroidism, unspecified: Secondary | ICD-10-CM

## 2022-11-12 DIAGNOSIS — R030 Elevated blood-pressure reading, without diagnosis of hypertension: Secondary | ICD-10-CM

## 2022-11-12 DIAGNOSIS — K219 Gastro-esophageal reflux disease without esophagitis: Secondary | ICD-10-CM | POA: Diagnosis not present

## 2022-11-12 DIAGNOSIS — R252 Cramp and spasm: Secondary | ICD-10-CM

## 2022-11-12 DIAGNOSIS — E119 Type 2 diabetes mellitus without complications: Secondary | ICD-10-CM

## 2022-11-12 LAB — B12 AND FOLATE PANEL
Folate: 8.5 ng/mL (ref >5.9)
Vitamin B-12: 145 pg/mL — ABNORMAL LOW (ref 211–911)

## 2022-11-12 LAB — POCT GLYCOSYLATED HEMOGLOBIN (HGB A1C): Hemoglobin A1C: 6.5 % — AB (ref 4.0–5.6)

## 2022-11-12 LAB — CBC WITH DIFFERENTIAL/PLATELET
Basophils Absolute: 0 10*3/uL (ref 0.0–0.1)
Basophils Relative: 0.5 % (ref 0.0–3.0)
Eosinophils Absolute: 0.2 10*3/uL (ref 0.0–0.7)
Eosinophils Relative: 2.4 % (ref 0.0–5.0)
HCT: 35.2 % — ABNORMAL LOW (ref 36.0–46.0)
Hemoglobin: 11.2 g/dL — ABNORMAL LOW (ref 12.0–15.0)
Lymphocytes Relative: 31.2 % (ref 12.0–46.0)
Lymphs Abs: 2 10*3/uL (ref 0.7–4.0)
MCHC: 31.9 g/dL (ref 30.0–36.0)
MCV: 73 fl — ABNORMAL LOW (ref 78.0–100.0)
Monocytes Absolute: 0.7 10*3/uL (ref 0.1–1.0)
Monocytes Relative: 10.6 % (ref 3.0–12.0)
Neutro Abs: 3.5 10*3/uL (ref 1.4–7.7)
Neutrophils Relative %: 55.3 % (ref 43.0–77.0)
Platelets: 164 10*3/uL (ref 150.0–400.0)
RBC: 4.82 Mil/uL (ref 3.87–5.11)
RDW: 17.9 % — ABNORMAL HIGH (ref 11.5–15.5)
WBC: 6.4 10*3/uL (ref 4.0–10.5)

## 2022-11-12 LAB — COMPREHENSIVE METABOLIC PANEL
ALT: 37 U/L — ABNORMAL HIGH (ref 0–35)
AST: 41 U/L — ABNORMAL HIGH (ref 0–37)
Albumin: 3.9 g/dL (ref 3.5–5.2)
Alkaline Phosphatase: 91 U/L (ref 39–117)
BUN: 13 mg/dL (ref 6–23)
CO2: 26 mEq/L (ref 19–32)
Calcium: 8.5 mg/dL (ref 8.4–10.5)
Chloride: 107 mEq/L (ref 96–112)
Creatinine, Ser: 0.76 mg/dL (ref 0.40–1.20)
GFR: 80.28 mL/min (ref >60.00)
Glucose, Bld: 128 mg/dL — ABNORMAL HIGH (ref 70–99)
Potassium: 4.9 mEq/L (ref 3.5–5.1)
Sodium: 141 mEq/L (ref 135–145)
Total Bilirubin: 0.6 mg/dL (ref 0.2–1.2)
Total Protein: 6.3 g/dL (ref 6.0–8.3)

## 2022-11-12 MED ORDER — LEVOTHYROXINE SODIUM 100 MCG PO TABS: 100.0000 ug | ORAL_TABLET | Freq: Every day | ORAL | 3 refills | Status: DC

## 2022-11-12 MED ORDER — OMEPRAZOLE 20 MG PO CPDR: 20.0000 mg | DELAYED_RELEASE_CAPSULE | Freq: Every day | ORAL | 3 refills | Status: DC

## 2022-11-12 NOTE — Patient Instructions (Signed)
Please return in 3 months for diabetes follow up   I will release your lab results to you on your MyChart account with further instructions. You may see the results before I do, but when I review them I will send you a message with my report or have my assistant call you if things need to be discussed. Please reply to my message with any questions. Thank you!   If you have any questions or concerns, please don't hesitate to send me a message via MyChart or call the office at [phone]. Thank you for visiting with [place] today! It's our pleasure caring for you.   Muscle Cramps and Spasms Muscle cramps and spasms occur when a muscle or muscles tighten and you have no control over this tightening (involuntary muscle contraction). They are a common problem and can develop in any muscle. The most common place is in the calf muscles of the leg. Muscle cramps and muscle spasms are both involuntary muscle contractions, but there are some differences between the two: Muscle cramps are painful. They come and go and may last for a few seconds or up to 15 minutes. Muscle cramps are often more forceful and last longer than muscle spasms. Muscle spasms may or may not be painful. They may also last just a few seconds or much longer. Certain medical conditions, such as diabetes or Parkinson's disease, can make it more likely to develop cramps or spasms. However, cramps or spasms are usually not caused by a serious underlying problem. Common causes include: Doing more physical work or exercise than your body is ready for (overexertion). Overuse from repeating certain movements too many times. Remaining in a certain position for a long period of time. Improper preparation, form, or technique while playing a sport or doing an activity. Dehydration. Injury. Side effects of some medicines. Abnormally low levels of the salts and minerals in your blood (electrolytes), especially potassium and calcium. This could happen if  you are taking water pills (diuretics) or if you are pregnant. In many cases, the cause of muscle cramps or spasms is not known. Follow these instructions at home: Managing pain and stiffness     Try massaging, stretching, and relaxing the affected muscle. Do this for several minutes at a time. If directed, apply heat to tight or tense muscles as often as told by your health care provider. Use the heat source that your health care provider recommends, such as a moist heat pack or a heating pad. Place a towel between your skin and the heat source. Leave the heat on for 20-30 minutes. Remove the heat if your skin turns bright red. This is especially important if you are unable to feel pain, heat, or cold. You may have a greater risk of getting burned. If directed, put ice on the affected area. This may help if you are sore or have pain after a cramp or spasm. Put ice in a plastic bag. Place a towel between your skin and the bag. Leave the ice on for 20 minutes, 2-3 times a day. Try taking hot showers or baths to help relax tight muscles. Eating and drinking Drink enough fluid to keep your urine pale yellow. Staying well hydrated may help prevent cramps or spasms. Eat a healthy diet that includes plenty of nutrients to help your muscles function. A healthy diet includes fruits and vegetables, lean protein, whole grains, and low-fat or nonfat dairy products. General instructions If you are having frequent cramps, avoid intense exercise for  several days. Take over-the-counter and prescription medicines only as told by your health care provider. Pay attention to any changes in your symptoms. Keep all follow-up visits as told by your health care provider. This is important. Contact a health care provider if: Your cramps or spasms get more severe or happen more often. Your cramps or spasms do not improve over time. Summary Muscle cramps and spasms occur when a muscle or muscles tighten and you  have no control over this tightening (involuntary muscle contraction). The most common place for cramps or spasms to occur is in the calf muscles of the leg. Massaging, stretching, and relaxing the affected muscle may relieve the cramp or spasm. Drink enough fluid to keep your urine pale yellow. Staying well hydrated may help prevent cramps or spasms. This information is not intended to replace advice given to you by your health care provider. Make sure you discuss any questions you have with your health care provider. Document Revised: 06/15/2021 Document Reviewed: 06/15/2021 Elsevier Patient Education  [place].

## 2022-11-12 NOTE — Progress Notes (Signed)
Subjective  CC:  Chief Complaint  Patient presents with   Diabetes   Anemia   Elevated Hepatic Enzymes    HPI: Shelly [name] is a 66 y.o. female who presents to the office today for follow up of diabetes and problems listed above in the chief complaint.  Diabetes follow up: Her diabetic control is reported as Improved. Tolerating met 500 bid (had mild gi sxs at first). Weight was down but regained a few pound with thanksgiving.  She denies exertional CP or SOB or symptomatic hypoglycemia. She denies foot sores or paresthesias.  Anemia: mild and microcytic w/o rectal bleeding, melana. Had active GERD and nsaid use at the time. Stopped nsaids. CRC screen is current. +FH of colonoscopy GERD: now resolved x 4 weeks on omeprazole 40 daily x 3 months.  Normotensive and neg microalbuminuria. No ACE C/o muscle cramps; calves and torso. Over last several months. ? Statin related.  Fatty liver; monitoring lfts.  Wt Readings from Last 3 Encounters:  11/12/22 256 lb 6.4 oz (116.3 kg)  09/09/22 250 lb (113.4 kg)  08/07/22 255 lb 6.4 oz (115.8 kg)    BP Readings from Last 3 Encounters:  11/12/22 122/70  08/07/22 116/70  02/04/22 126/80    Assessment  1. Type 2 diabetes mellitus treated without insulin ([place])   2. Fatty liver   3. White coat syndrome without diagnosis of hypertension   4. Microcytic anemia   5. Thrombocytopenia ([place])   6. Acquired hypothyroidism   7. Muscle cramps   8. Gastroesophageal reflux disease, unspecified whether esophagitis present   9. Long-term current use of proton pump inhibitor therapy      Plan  Diabetes is currently well controlled. Continue met 500 bid.  Recheck lfts Low fat diet Recheck cbc and anemia labs.  GERD: decrease back down to 34m omeprazole daily Monitor platelets, chronic Mm cramps: stretching, hydration, tonic water and trial off statin although unlikely related.    Follow up: 3 mo for dm. Orders Placed This Encounter   Procedures   Iron, TIBC and Ferritin Panel   B12 and Folate Panel   CBC with Differential/Platelet   Comprehensive metabolic panel   POCT HgB A1C   Meds ordered this encounter  Medications   levothyroxine (SYNTHROID) 100 MCG tablet    Sig: Take 1 tablet (100 mcg total) by mouth daily before breakfast.    Dispense:  90 tablet    Refill:  3    Please hold for next refill due      Immunization History  Administered Date(s) Administered   Fluad Quad(high Dose 65+) 10/02/2021, 10/07/2022   Influenza Whole 10/09/2010   Influenza,inj,Quad PF,6+ Mos 09/20/2014, 09/29/2017, 10/05/2018, 09/15/2019   Influenza-Unspecified 10/02/2016, 09/29/2017   Moderna Covid-19 Vaccine Bivalent Booster 180yr& up 09/18/2021   Moderna SARS-COV2 Booster Vaccination 10/07/2022   Moderna Sars-Covid-2 Vaccination 12/30/2019, 02/04/2020, 10/17/2020   Pneumococcal Conjugate-13 09/15/2019   Pneumococcal Polysaccharide-23 01/16/2018   Td 12/09/1998, 01/07/2011   Tdap 09/20/2014   Varicella Zoster Immune Globulin 10/07/2014   Zoster Recombinat (Shingrix) 01/16/2018, 10/05/2018   Zoster, Live 09/08/2014    Diabetes Related Lab Review: Lab Results  Component Value Date   HGBA1C 6.5 (A) 11/12/2022   HGBA1C 7.3 (H) 08/07/2022   HGBA1C 7.1 (A) 02/04/2022    Lab Results  Component Value Date   MICROALBUR 1.1 08/07/2022   Lab Results  Component Value Date   CREATININE 0.74 08/07/2022   BUN 12 08/07/2022   NA 138 08/07/2022  K 4.0 08/07/2022   CL 106 08/07/2022   CO2 23 08/07/2022   Lab Results  Component Value Date   CHOL 104 08/07/2022   CHOL 107 02/04/2022   CHOL 128 03/13/2021   Lab Results  Component Value Date   HDL 64.40 08/07/2022   HDL 62.80 02/04/2022   HDL 61.40 03/13/2021   Lab Results  Component Value Date   LDLCALC 25 08/07/2022   LDLCALC 30 02/04/2022   LDLCALC 48 03/13/2021   Lab Results  Component Value Date   TRIG 73.0 08/07/2022   TRIG 71.0 02/04/2022   TRIG  95.0 03/13/2021   Lab Results  Component Value Date   CHOLHDL 2 08/07/2022   CHOLHDL 2 02/04/2022   CHOLHDL 2 03/13/2021   No results found for: "LDLDIRECT" The ASCVD Risk score (Arnett DK, et al., 2019) failed to calculate for the following reasons:   The valid total cholesterol range is 130 to 320 mg/dL I have reviewed the PMH, Fam and Soc history. Patient Active Problem List   Diagnosis Date Noted   Diet-controlled diabetes mellitus ([place]) 06/20/2021    Priority: High   White coat syndrome without diagnosis of hypertension 01/16/2018    Priority: High   Severe obesity (BMI 35.0-35.9 with comorbidity) ([place])     Priority: High   Acquired hypothyroidism 01/07/2011    Priority: High   Primary osteoarthritis of left knee 01/05/2019    Priority: Medium    Family history of colon cancer 01/16/2018    Priority: Medium     Grandparents, elderly    Family history of breast cancer 01/16/2018    Priority: Medium     Mom and MGM, 80s    GERD (gastroesophageal reflux disease) 06/02/2014    Priority: Medium    Irritable bowel syndrome 01/07/2011    Priority: Medium    Fatty liver 01/07/2011    Priority: Medium    Chronic allergic rhinitis 12/30/2019    Priority: Low   Thrombocytopenia ([place]) 01/07/2019    Priority: Low    Chronic by lab review    Long-term current use of proton pump inhibitor therapy 11/12/2022   Candidate for statin therapy due to risk of future cardiovascular event 06/20/2021   Visit for review of DEXA scan 08/30/2020    Dexa 08/2020; normal. Recheck in 5 years     Social History: Patient  reports that she has never smoked. She has never used smokeless tobacco. She reports current alcohol use. She reports that she does not use drugs.  Review of Systems: Ophthalmic: negative for eye pain, loss of vision or double vision Cardiovascular: negative for chest pain Respiratory: negative for SOB or persistent cough Gastrointestinal: negative for abdominal  pain Genitourinary: negative for dysuria or gross hematuria MSK: negative for foot lesions Neurologic: negative for weakness or gait disturbance  Objective  Vitals: BP 122/70   Pulse 71   Temp 98.7 F (37.1 C)   Ht _0  (1.727 m)   Wt 256 lb 6.4 oz (116.3 kg)   SpO2 98%   BMI 38.99 kg/m  General: well appearing, no acute distress  Psych:  Alert and oriented, normal mood and affect    Diabetic education: ongoing education regarding chronic disease management for diabetes was given today. We continue to reinforce the ABC's of diabetic management: A1c (<7 or 8 dependent upon patient), tight blood pressure control, and cholesterol management with goal LDL < 100 minimally. We discuss diet strategies, exercise recommendations, medication options and possible side effects.  At each visit, we review recommended immunizations and preventive care recommendations for diabetics and stress that good diabetic control can prevent other problems. See below for this patient's data.   Commons side effects, risks, benefits, and alternatives for medications and treatment plan prescribed today were discussed, and the patient expressed understanding of the given instructions. Patient is instructed to call or message via MyChart if he/she has any questions or concerns regarding our treatment plan. No barriers to understanding were identified. We discussed Red Flag symptoms and signs in detail. Patient expressed understanding regarding what to do in case of urgent or emergency type symptoms.  Medication list was reconciled, printed and provided to the patient in AVS. Patient instructions and summary information was reviewed with the patient as documented in the AVS. This note was prepared with assistance of Dragon voice recognition software. Occasional wrong-word or sound-a-like substitutions may have occurred due to the inherent limitations of voice recognition software

## 2022-11-13 LAB — IRON,TIBC AND FERRITIN PANEL
%SAT: 6 % (calc) — ABNORMAL LOW (ref 16–45)
Ferritin: 7 ng/mL — ABNORMAL LOW (ref 16–288)
Iron: 26 ug/dL — ABNORMAL LOW (ref 45–160)
TIBC: 448 mcg/dL (calc) (ref 250–450)

## 2022-11-25 ENCOUNTER — Other Ambulatory Visit: Payer: Self-pay | Admitting: Family Medicine

## 2022-11-25 DIAGNOSIS — K219 Gastro-esophageal reflux disease without esophagitis: Secondary | ICD-10-CM

## 2022-11-26 ENCOUNTER — Telehealth: Payer: Self-pay | Admitting: Family Medicine

## 2022-11-26 NOTE — Telephone Encounter (Signed)
Called pt to schedule nurse visit for B12. Pt would rather her sister to give her the injection. Please advise

## 2022-11-27 ENCOUNTER — Other Ambulatory Visit: Payer: Self-pay

## 2022-11-27 DIAGNOSIS — E538 Deficiency of other specified B group vitamins: Secondary | ICD-10-CM

## 2022-11-27 MED ORDER — CYANOCOBALAMIN 1000 MCG/ML IJ SOLN: 1000.0000 ug | Freq: Once | INTRAMUSCULAR | 0 refills | Status: DC

## 2022-11-27 MED ORDER — CYANOCOBALAMIN 1000 MCG/ML IJ SOLN: 1000.0000 ug | Freq: Once | INTRAMUSCULAR | 2 refills | Status: AC

## 2022-11-27 NOTE — Telephone Encounter (Signed)
Rx sent to pharmacy   

## 2022-12-13 DIAGNOSIS — L821 Other seborrheic keratosis: Secondary | ICD-10-CM | POA: Diagnosis not present

## 2022-12-13 DIAGNOSIS — D225 Melanocytic nevi of trunk: Secondary | ICD-10-CM | POA: Diagnosis not present

## 2022-12-13 DIAGNOSIS — Z85828 Personal history of other malignant neoplasm of skin: Secondary | ICD-10-CM | POA: Diagnosis not present

## 2022-12-13 DIAGNOSIS — L814 Other melanin hyperpigmentation: Secondary | ICD-10-CM | POA: Diagnosis not present

## 2022-12-13 DIAGNOSIS — C44719 Basal cell carcinoma of skin of left lower limb, including hip: Secondary | ICD-10-CM | POA: Diagnosis not present

## 2022-12-13 DIAGNOSIS — C44319 Basal cell carcinoma of skin of other parts of face: Secondary | ICD-10-CM | POA: Diagnosis not present

## 2022-12-13 DIAGNOSIS — D485 Neoplasm of uncertain behavior of skin: Secondary | ICD-10-CM | POA: Diagnosis not present

## 2022-12-13 DIAGNOSIS — Z08 Encounter for follow-up examination after completed treatment for malignant neoplasm: Secondary | ICD-10-CM | POA: Diagnosis not present

## 2022-12-13 DIAGNOSIS — L918 Other hypertrophic disorders of the skin: Secondary | ICD-10-CM | POA: Diagnosis not present

## 2022-12-13 DIAGNOSIS — L538 Other specified erythematous conditions: Secondary | ICD-10-CM | POA: Diagnosis not present

## 2022-12-13 DIAGNOSIS — L820 Inflamed seborrheic keratosis: Secondary | ICD-10-CM | POA: Diagnosis not present

## 2022-12-25 DIAGNOSIS — C44719 Basal cell carcinoma of skin of left lower limb, including hip: Secondary | ICD-10-CM | POA: Diagnosis not present

## 2022-12-25 DIAGNOSIS — L080 Pyoderma: Secondary | ICD-10-CM | POA: Diagnosis not present

## 2023-01-07 DIAGNOSIS — C44719 Basal cell carcinoma of skin of left lower limb, including hip: Secondary | ICD-10-CM | POA: Diagnosis not present

## 2023-01-24 ENCOUNTER — Other Ambulatory Visit: Payer: Self-pay | Admitting: Internal Medicine

## 2023-01-24 DIAGNOSIS — C44319 Basal cell carcinoma of skin of other parts of face: Secondary | ICD-10-CM | POA: Diagnosis not present

## 2023-01-24 DIAGNOSIS — K589 Irritable bowel syndrome without diarrhea: Secondary | ICD-10-CM

## 2023-02-03 ENCOUNTER — Other Ambulatory Visit (INDEPENDENT_AMBULATORY_CARE_PROVIDER_SITE_OTHER): Payer: Self-pay

## 2023-02-03 DIAGNOSIS — K589 Irritable bowel syndrome without diarrhea: Secondary | ICD-10-CM

## 2023-02-03 MED ORDER — HYOSCYAMINE SULFATE 0.125 MG SL SUBL: SUBLINGUAL_TABLET | SUBLINGUAL | 0 refills | Status: DC

## 2023-02-06 ENCOUNTER — Ambulatory Visit: Payer: Medicare PPO | Admitting: Family Medicine

## 2023-02-17 ENCOUNTER — Ambulatory Visit: Payer: Medicare PPO | Admitting: Family Medicine

## 2023-02-17 ENCOUNTER — Encounter: Payer: Self-pay | Admitting: Family Medicine

## 2023-02-17 VITALS — BP 120/60 | HR 75 | Temp 98.3°F | Ht 68.0 in | Wt 252.8 lb

## 2023-02-17 DIAGNOSIS — E538 Deficiency of other specified B group vitamins: Secondary | ICD-10-CM

## 2023-02-17 DIAGNOSIS — D696 Thrombocytopenia, unspecified: Secondary | ICD-10-CM | POA: Diagnosis not present

## 2023-02-17 DIAGNOSIS — K760 Fatty (change of) liver, not elsewhere classified: Secondary | ICD-10-CM | POA: Diagnosis not present

## 2023-02-17 DIAGNOSIS — D509 Iron deficiency anemia, unspecified: Secondary | ICD-10-CM | POA: Diagnosis not present

## 2023-02-17 DIAGNOSIS — E119 Type 2 diabetes mellitus without complications: Secondary | ICD-10-CM | POA: Diagnosis not present

## 2023-02-17 DIAGNOSIS — T466X5A Adverse effect of antihyperlipidemic and antiarteriosclerotic drugs, initial encounter: Secondary | ICD-10-CM

## 2023-02-17 DIAGNOSIS — Z6835 Body mass index (BMI) 35.0-35.9, adult: Secondary | ICD-10-CM | POA: Diagnosis not present

## 2023-02-17 DIAGNOSIS — M791 Myalgia, unspecified site: Secondary | ICD-10-CM

## 2023-02-17 DIAGNOSIS — R030 Elevated blood-pressure reading, without diagnosis of hypertension: Secondary | ICD-10-CM

## 2023-02-17 LAB — POCT GLYCOSYLATED HEMOGLOBIN (HGB A1C): Hemoglobin A1C: 6.5 % — AB (ref 4.0–5.6)

## 2023-02-17 MED ORDER — CYANOCOBALAMIN 1000 MCG/ML IJ SOLN: 1000.0000 ug | INTRAMUSCULAR | 3 refills | Status: DC

## 2023-02-17 MED ORDER — "SYRINGE 20G X 1"" 3 ML MISC": 1.0000 | 0 refills | Status: DC

## 2023-02-17 MED ORDER — POLYSACCHARIDE IRON COMPLEX 150 MG PO CAPS: 150.0000 mg | ORAL_CAPSULE | Freq: Every day | ORAL | 5 refills | Status: DC

## 2023-02-17 NOTE — Patient Instructions (Signed)
Please return in 6 months for complete physical and recheck.   Please take your iron and vitamin B12 injections as directed.   If you have any questions or concerns, please don't hesitate to send me a message via MyChart or call the office at [phone]. Thank you for visiting with [place] today! It's our pleasure caring for you.

## 2023-02-17 NOTE — Progress Notes (Signed)
Subjective  CC:  Chief Complaint  Patient presents with   Diabetes    HPI: Shelly [name] is a 67 y.o. female who presents to the office today for follow up of diabetes and problems listed above in the chief complaint.  Diabetes follow up: Her diabetic control is reported as Unchanged. Tolerating the metformin 500 bid well. Diet is good. No hyperglycemic sxs.  She denies exertional CP or SOB or symptomatic hypoglycemia. She denies foot sores or paresthesias. Not on ace; normotensive and neg urine mircoalbuminuria Could not tolerate crestor 5 daily: severe mm spasms in back. Stopped after quitting medication. Not wanting to retry.  Blood pressure remains excellent. Normotensive.  Iron and b12 deficiency anemia: hasn't started supplements. Took one b12 dose in December but needs meds; sister to administer injections. No longer on nsaids (suspect nsaid induced gastritis as cause of iron deficiency).  GERD is controlled on omeprazole 20.   Wt Readings from Last 3 Encounters:  02/17/23 252 lb 12.8 oz (114.7 kg)  11/12/22 256 lb 6.4 oz (116.3 kg)  09/09/22 250 lb (113.4 kg)    BP Readings from Last 3 Encounters:  02/17/23 120/60  11/12/22 122/70  08/07/22 116/70    Assessment  1. Type 2 diabetes mellitus treated without insulin ([place])   2. Iron deficiency anemia, unspecified iron deficiency anemia type   3. Vitamin B12 deficiency   4. White coat syndrome without diagnosis of hypertension   5. Severe obesity (BMI 35.0-35.9 with comorbidity) ([place])   6. Fatty liver   7. Thrombocytopenia ([place])   8. Myalgia due to statin      Plan  Diabetes is currently very well controlled. Cont met 500 bid.  Discussed anemia and treatments: pt to start b12 1032mg IM monthly and niferex 150 bid. Wants to recheck in 6 months.  Discussed fatty liver. Will recheck in 6 months.  Now off crestor. Follow lipids.  Monitor platelets. No signs of low plts.    Follow up: 6 mo for cpe and recheck  above. . Orders Placed This Encounter  Procedures   POCT HgB A1C   Meds ordered this encounter  Medications   iron polysaccharides (NIFEREX) 150 MG capsule    Sig: Take 1 capsule (150 mg total) by mouth daily.    Dispense:  60 capsule    Refill:  5      Immunization History  Administered Date(s) Administered   Fluad Quad(high Dose 65+) 10/02/2021, 10/07/2022   Influenza Whole 10/09/2010   Influenza,inj,Quad PF,6+ Mos 09/20/2014, 09/29/2017, 10/05/2018, 09/15/2019   Influenza-Unspecified 10/02/2016, 09/29/2017   Moderna Covid-19 Vaccine Bivalent Booster 169yr& up 09/18/2021   Moderna SARS-COV2 Booster Vaccination 10/07/2022   Moderna Sars-Covid-2 Vaccination 12/30/2019, 02/04/2020, 10/17/2020   Pneumococcal Conjugate-13 09/15/2019   Pneumococcal Polysaccharide-23 01/16/2018   Td 12/09/1998, 01/07/2011   Tdap 09/20/2014   Varicella Zoster Immune Globulin 10/07/2014   Zoster Recombinat (Shingrix) 01/16/2018, 10/05/2018   Zoster, Live 09/08/2014    Diabetes Related Lab Review: Lab Results  Component Value Date   HGBA1C 6.5 (A) 02/17/2023   HGBA1C 6.5 (A) 11/12/2022   HGBA1C 7.3 (H) 08/07/2022    Lab Results  Component Value Date   MICROALBUR 1.1 08/07/2022   Lab Results  Component Value Date   CREATININE 0.76 11/12/2022   BUN 13 11/12/2022   NA 141 11/12/2022   K 4.9 11/12/2022   CL 107 11/12/2022   CO2 26 11/12/2022   Lab Results  Component Value Date   CHOL  104 08/07/2022   CHOL 107 02/04/2022   CHOL 128 03/13/2021   Lab Results  Component Value Date   HDL 64.40 08/07/2022   HDL 62.80 02/04/2022   HDL 61.40 03/13/2021   Lab Results  Component Value Date   LDLCALC 25 08/07/2022   LDLCALC 30 02/04/2022   LDLCALC 48 03/13/2021   Lab Results  Component Value Date   TRIG 73.0 08/07/2022   TRIG 71.0 02/04/2022   TRIG 95.0 03/13/2021   Lab Results  Component Value Date   CHOLHDL 2 08/07/2022   CHOLHDL 2 02/04/2022   CHOLHDL 2 03/13/2021   No  results found for: "LDLDIRECT" The ASCVD Risk score (Arnett DK, et al., 2019) failed to calculate for the following reasons:   The valid total cholesterol range is 130 to 320 mg/dL I have reviewed the PMH, Fam and Soc history. Patient Active Problem List   Diagnosis Date Noted   Type 2 diabetes mellitus treated without insulin ([place]) 06/20/2021    Priority: High   White coat syndrome without diagnosis of hypertension 01/16/2018    Priority: High   Severe obesity (BMI 35.0-35.9 with comorbidity) ([place])     Priority: High   Acquired hypothyroidism 01/07/2011    Priority: High   Primary osteoarthritis of left knee 01/05/2019    Priority: Medium    Family history of colon cancer 01/16/2018    Priority: Medium     Grandparents, elderly    Family history of breast cancer 01/16/2018    Priority: Medium     Mom and MGM, 80s    GERD (gastroesophageal reflux disease) 06/02/2014    Priority: Medium    Irritable bowel syndrome 01/07/2011    Priority: Medium    Fatty liver 01/07/2011    Priority: Medium    Chronic allergic rhinitis 12/30/2019    Priority: Low   Thrombocytopenia ([place]) 01/07/2019    Priority: Low    Chronic by lab review    Vitamin B12 deficiency 02/17/2023   Myalgia due to statin 02/17/2023   Long-term current use of proton pump inhibitor therapy 11/12/2022   Candidate for statin therapy due to risk of future cardiovascular event 06/20/2021   Visit for review of DEXA scan 08/30/2020    Dexa 08/2020; normal. Recheck in 5 years     Social History: Patient  reports that she has never smoked. She has never used smokeless tobacco. She reports current alcohol use. She reports that she does not use drugs.  Review of Systems: Ophthalmic: negative for eye pain, loss of vision or double vision Cardiovascular: negative for chest pain Respiratory: negative for SOB or persistent cough Gastrointestinal: negative for abdominal pain Genitourinary: negative for dysuria or gross  hematuria MSK: negative for foot lesions Neurologic: negative for weakness or gait disturbance  Objective  Vitals: BP 120/60   Pulse 75   Temp 98.3 F (36.8 C)   Ht '5\' 8"'$  (1.727 m)   Wt 252 lb 12.8 oz (114.7 kg)   SpO2 95%   BMI 38.44 kg/m  General: well appearing, no acute distress  Psych:  Alert and oriented, normal mood and affect HEENT:  Normocephalic, atraumatic, moist mucous membranes, supple neck  Cardiovascular:  Nl S1 and S2, RRR without murmur, gallop or rub. no edema Respiratory:  Good breath sounds bilaterally, CTAB with normal effort, no rales   Diabetic education: ongoing education regarding chronic disease management for diabetes was given today. We continue to reinforce the ABC's of diabetic management: A1c (<7 or 8  dependent upon patient), tight blood pressure control, and cholesterol management with goal LDL < 100 minimally. We discuss diet strategies, exercise recommendations, medication options and possible side effects. At each visit, we review recommended immunizations and preventive care recommendations for diabetics and stress that good diabetic control can prevent other problems. See below for this patient's data.   Commons side effects, risks, benefits, and alternatives for medications and treatment plan prescribed today were discussed, and the patient expressed understanding of the given instructions. Patient is instructed to call or message via MyChart if he/she has any questions or concerns regarding our treatment plan. No barriers to understanding were identified. We discussed Red Flag symptoms and signs in detail. Patient expressed understanding regarding what to do in case of urgent or emergency type symptoms.  Medication list was reconciled, printed and provided to the patient in AVS. Patient instructions and summary information was reviewed with the patient as documented in the AVS. This note was prepared with assistance of Dragon voice recognition  software. Occasional wrong-word or sound-a-like substitutions may have occurred due to the inherent limitations of voice recognition software

## 2023-05-09 DIAGNOSIS — L814 Other melanin hyperpigmentation: Secondary | ICD-10-CM | POA: Diagnosis not present

## 2023-05-09 DIAGNOSIS — L538 Other specified erythematous conditions: Secondary | ICD-10-CM | POA: Diagnosis not present

## 2023-05-09 DIAGNOSIS — L821 Other seborrheic keratosis: Secondary | ICD-10-CM | POA: Diagnosis not present

## 2023-05-09 DIAGNOSIS — L570 Actinic keratosis: Secondary | ICD-10-CM | POA: Diagnosis not present

## 2023-05-09 DIAGNOSIS — Z08 Encounter for follow-up examination after completed treatment for malignant neoplasm: Secondary | ICD-10-CM | POA: Diagnosis not present

## 2023-05-09 DIAGNOSIS — Z85828 Personal history of other malignant neoplasm of skin: Secondary | ICD-10-CM | POA: Diagnosis not present

## 2023-05-09 DIAGNOSIS — L298 Other pruritus: Secondary | ICD-10-CM | POA: Diagnosis not present

## 2023-05-09 DIAGNOSIS — L820 Inflamed seborrheic keratosis: Secondary | ICD-10-CM | POA: Diagnosis not present

## 2023-06-02 ENCOUNTER — Encounter (INDEPENDENT_AMBULATORY_CARE_PROVIDER_SITE_OTHER): Payer: Self-pay | Admitting: Gastroenterology

## 2023-06-02 ENCOUNTER — Ambulatory Visit (INDEPENDENT_AMBULATORY_CARE_PROVIDER_SITE_OTHER): Payer: Medicare PPO | Admitting: Gastroenterology

## 2023-06-02 VITALS — BP 129/70 | HR 75 | Temp 98.2°F | Ht 68.0 in | Wt 252.0 lb

## 2023-06-02 DIAGNOSIS — D509 Iron deficiency anemia, unspecified: Secondary | ICD-10-CM | POA: Diagnosis not present

## 2023-06-02 DIAGNOSIS — K219 Gastro-esophageal reflux disease without esophagitis: Secondary | ICD-10-CM | POA: Diagnosis not present

## 2023-06-02 DIAGNOSIS — K589 Irritable bowel syndrome without diarrhea: Secondary | ICD-10-CM | POA: Diagnosis not present

## 2023-06-02 MED ORDER — HYOSCYAMINE SULFATE 0.125 MG SL SUBL: 0.1250 mg | SUBLINGUAL_TABLET | Freq: Two times a day (BID) | SUBLINGUAL | 3 refills | Status: DC | PRN

## 2023-06-02 NOTE — Patient Instructions (Signed)
I will recheck hemoglobin and iron studies, we may need to discuss upper endoscopy/colonoscopy if labs are still low Continue with good water intake, aim for 64 oz per day atleast  Continue omeprazole 40mg  daily, can consider decreasing dose pending how your labs look Start benefiber 1T twice daily with a meal, can increase to three times per day after 1-2 weeks if tolerating well  Continue with plenty of fruits, veggies and whole grains, kiwi are especially good for constipation  Follow up 6 months  It was a pleasure to see you today. I want to create trusting relationships with patients and provide genuine, compassionate, and quality care. I truly value your feedback! please be on the lookout for a survey regarding your visit with me today. I appreciate your input about our visit and your time in completing this!    Cheronda Erck L. Jeanmarie Hubert, MSN, APRN, AGNP-C Adult-Gerontology Nurse Practitioner [facility] at [facility]

## 2023-06-02 NOTE — Progress Notes (Unsigned)
Referring Provider: Willow Ora, MD Primary Care Physician:  Willow Ora, MD Primary GI Physician: Levon Hedger   Chief Complaint  Patient presents with   Irritable Bowel Syndrome    Follow up on IBS. States all her life she has had diarrhea and now she is changing over to having constipation and bloating. Has been drinking coffee, eating fruit and walking to help with constipation.    Gastroesophageal Reflux    Follow up on reflux. Patient taking omeprazole 40mg  qam. Doing well with med.    HPI:   Shelly [name] is a 67 y.o. female with past medical history of BCC, DM, GERD, hypothyroidism, IBS, OA, thrombopenia   Patient presenting today for IBS and GERD   Last seen in office in 2020, GERD stable on Omeprazole 40mg  daily, having constipation, recommended benefiber, miralax, hyosciamine for her IBS.  Seen for colonoscopy in 2021.  Present: Patient states she has had more diarrhea in the past, more constipation recently. TSH in August 3.17. She will have 3 days of constipation and then diarrhea. She will have some bloating in her abdomen. She has tried Investment banker, corporate and miralax in the past though unsure if this worked. She notes hot coffee and fruits tend to help her constipation. She notes she tries to increase liquids, fruit and exercise when she becomes constipated. She takes levsin once a day for abdominal discomfort. She is unsure if this is helping her. She denies overt abdominal pain. Mostly just bloating. She is on iron pill so stools are dark, has not seen any obvious BRBPR. Notably, iron studies in December were low with hgb 11.2, iron 26, TIBC 448, st 6%, ferritin 7. B12 also low at 145. Started on iron thereafter. Taking it maybe every other day, sometimes less as it causes her to have diarrhea. Has not had labs rechecked since then.  Unclear why patient has anemia. Denies dizziness, shortness of breath or fatigue. She has not had any weight loss. She feels appetite has  decreased some. Denies nausea or vomiting. Takes rare otc NSAIDs. Notes she was on meloxicam back at the end of 2023, she had a lot of abdominal pain and burning, she stopped the medication after about 5 days.   GERD well controlled on 40mg  omeprazole daily. Feels that symptoms are well controlled. She had been on 40mg  daily previously so PCP decreased her to 20mg  daily which she did okay with for a while then started to have symptoms so she resumed 40mg  dosing    Last Colonoscopy: 2021  - The entire examined colon is normal.                           - External hemorrhoids.                           - Anal papilla(e) were hypertrophied.                           - No specimens collected.  Recommendations:  Repeat in 5 years   Past Medical History:  Diagnosis Date   Cancer (HCC)    Basal Cell    Diabetes mellitus without complication (HCC)    GERD (gastroesophageal reflux disease)    Headache(784.0)    Hypothyroidism    Irritable bowel syndrome (IBS)    Primary osteoarthritis of left knee 01/05/2019   Thrombopenia (  HCC) 01/07/2019   Chronic by lab review    Past Surgical History:  Procedure Laterality Date   ABDOMINAL HYSTERECTOMY     APPENDECTOMY  1991   COLONOSCOPY N/A 06/19/2015   Procedure: COLONOSCOPY;  Surgeon: Malissa Hippo, MD;  Location: [facility];  Service: Endoscopy;  Laterality: N/A;  8:30-9:30   COLONOSCOPY WITH PROPOFOL N/A 09/20/2020   Procedure: COLONOSCOPY WITH PROPOFOL;  Surgeon: Malissa Hippo, MD;  Location: [facility];  Service: Endoscopy;  Laterality: N/A;  1015   cyst removed     right arm   FLEXIBLE SIGMOIDOSCOPY  07/23/2011   Procedure: FLEXIBLE SIGMOIDOSCOPY;  Surgeon: Malissa Hippo, MD;  Location: [facility];  Service: Endoscopy;  Laterality: N/A;   TONSILLECTOMY AND ADENOIDECTOMY      Current Outpatient Medications  Medication Sig Dispense Refill   Artificial Tear Solution (SOOTHE XP OP) Place 1 drop into both eyes daily as needed  (dry eye).      cyanocobalamin (VITAMIN B12) 1000 MCG/ML injection Inject 1 mL (1,000 mcg total) into the muscle every 30 (thirty) days. 3 mL 3   fexofenadine (ALLEGRA) 180 MG tablet Take 180 mg by mouth daily.     hyoscyamine (LEVSIN SL) 0.125 MG SL tablet PLACE 1 TABLET UNDER THETONGUE THREE TIMES DAILY AS NEEDED. NEEDS OFFICE VISIT 90 tablet 0   iron polysaccharides (NIFEREX) 150 MG capsule Take 1 capsule (150 mg total) by mouth daily. 60 capsule 5   levothyroxine (SYNTHROID) 100 MCG tablet Take 1 tablet (100 mcg total) by mouth daily before breakfast. 90 tablet 3   metFORMIN (GLUCOPHAGE) 500 MG tablet Take 1 tablet (500 mg total) by mouth 2 (two) times daily with a meal. 180 tablet 3   omeprazole (PRILOSEC) 20 MG capsule Take 1 capsule (20 mg total) by mouth daily. 180 capsule 3   Syringe/Needle, Disp, (SYRINGE 3CC/20GX1") 20G X 1" 3 ML MISC 1 each by Does not apply route every 30 (thirty) days. 52 each 0   No current facility-administered medications for this visit.    Allergies as of 06/02/2023 - Review Complete 06/02/2023  Allergen Reaction Noted   Propoxyphene n-acetaminophen Hives and Palpitations     Family History  Problem Relation Age of Onset   Breast cancer Mother 83   Atrial fibrillation Mother 40       s/p ablation   Breast cancer Maternal Grandmother 54   Colon cancer Maternal Aunt 14   Colon cancer Maternal Uncle 52    Social History   Socioeconomic History   Marital status: Married    Spouse name: Not on file   Number of children: Not on file   Years of education: Not on file   Highest education level: Not on file  Occupational History   Not on file  Tobacco Use   Smoking status: Never   Smokeless tobacco: Never  Substance and Sexual Activity   Alcohol use: Yes    Comment: social   Drug use: No   Sexual activity: Yes    Birth control/protection: Surgical  Other Topics Concern   Not on file  Social History Narrative   Not on file   Social  Determinants of Health   Financial Resource Strain: Low Risk  (09/09/2022)   Overall Financial Resource Strain (CARDIA)    Difficulty of Paying Living Expenses: Not hard at all  Food Insecurity: No Food Insecurity (09/09/2022)   Hunger Vital Sign    Worried About Programme researcher, broadcasting/film/video in  the Last Year: Never true    Ran Out of Food in the Last Year: Never true  Transportation Needs: No Transportation Needs (09/09/2022)   PRAPARE - Administrator, Civil Service (Medical): No    Lack of Transportation (Non-Medical): No  Physical Activity: Inactive (09/09/2022)   Exercise Vital Sign    Days of Exercise per Week: 0 days    Minutes of Exercise per Session: 0 min  Stress: No Stress Concern Present (09/09/2022)   [employer] of Occupational Health - Occupational Stress Questionnaire    Feeling of Stress : Not at all  Social Connections: Socially Integrated (09/09/2022)   Social Connection and Isolation Panel [NHANES]    Frequency of Communication with Friends and Family: More than three times a week    Frequency of Social Gatherings with Friends and Family: More than three times a week    Attends Religious Services: More than 4 times per year    Active Member of [employer] or Organizations: Yes    Attends Banker Meetings: 1 to 4 times per year    Marital Status: Married   Review of systems General: negative for malaise, night sweats, fever, chills, weight loss Neck: Negative for lumps, goiter, pain and significant neck swelling Resp: Negative for cough, wheezing, dyspnea at rest CV: Negative for chest pain, leg swelling, palpitations, orthopnea GI: denies melena, hematochezia, nausea, vomiting, diarrhea, constipation, dysphagia, odyonophagia, early satiety or unintentional weight loss.  MSK: Negative for joint pain or swelling, back pain, and muscle pain. Derm: Negative for itching or rash Psych: Denies depression, anxiety, memory loss, confusion. No homicidal or  suicidal ideation.  Heme: Negative for prolonged bleeding, bruising easily, and swollen nodes. Endocrine: Negative for cold or heat intolerance, polyuria, polydipsia and goiter. Neuro: negative for tremor, gait imbalance, syncope and seizures. The remainder of the review of systems is noncontributory.  Physical Exam: BP 129/70 (BP Location: Right Arm, Patient Position: Sitting, Cuff Size: Large)   Pulse 75   Temp 98.2 F (36.8 C) (Oral)   Ht 5\' 8"  (1.727 m)   Wt 252 lb (114.3 kg)   BMI 38.32 kg/m  General:   Alert and oriented. No distress noted. Pleasant and cooperative.  Head:  Normocephalic and atraumatic. Eyes:  Conjuctiva clear without scleral icterus. Mouth:  Oral mucosa pink and moist. Good dentition. No lesions. Heart: Normal rate and rhythm, s1 and s2 heart sounds present.  Lungs: Clear lung sounds in all lobes. Respirations equal and unlabored. Abdomen:  +BS, soft, non-tender and non-distended. No rebound or guarding. No HSM or masses noted. Derm: No palmar erythema or jaundice Msk:  Symmetrical without gross deformities. Normal posture. Extremities:  Without edema. Neurologic:  Alert and  oriented x4 Psych:  Alert and cooperative. Normal mood and affect.  Invalid input(s): "6 MONTHS"   ASSESSMENT: Shelly [name] is a 67 y.o. female presenting today    PLAN:  Recheck h&h, Iron studies, consider ENDO evals  2. Continue Good water intake  3. Continue omeprazole 40mg  daily, consider decreasing dose  4. Start benefiber 1T  5. Increase fruits, veggies and whole grains   All questions were answered, patient verbalized understanding and is in agreement with plan as outlined above.   Follow Up: 6 months   Anson Peddie L. Jeanmarie Hubert, MSN, APRN, AGNP-C Adult-Gerontology Nurse Practitioner [facility]

## 2023-06-03 ENCOUNTER — Encounter (INDEPENDENT_AMBULATORY_CARE_PROVIDER_SITE_OTHER): Payer: Self-pay

## 2023-06-03 ENCOUNTER — Ambulatory Visit (INDEPENDENT_AMBULATORY_CARE_PROVIDER_SITE_OTHER): Payer: Self-pay

## 2023-06-03 ENCOUNTER — Other Ambulatory Visit (INDEPENDENT_AMBULATORY_CARE_PROVIDER_SITE_OTHER): Payer: Self-pay

## 2023-06-03 ENCOUNTER — Ambulatory Visit (INDEPENDENT_AMBULATORY_CARE_PROVIDER_SITE_OTHER): Payer: Self-pay | Admitting: Gastroenterology

## 2023-06-03 ENCOUNTER — Other Ambulatory Visit: Payer: Self-pay

## 2023-06-03 ENCOUNTER — Other Ambulatory Visit (INDEPENDENT_AMBULATORY_CARE_PROVIDER_SITE_OTHER): Payer: Self-pay | Admitting: Gastroenterology

## 2023-06-03 ENCOUNTER — Other Ambulatory Visit: Payer: Self-pay | Admitting: Family Medicine

## 2023-06-03 DIAGNOSIS — E119 Type 2 diabetes mellitus without complications: Secondary | ICD-10-CM

## 2023-06-03 DIAGNOSIS — D509 Iron deficiency anemia, unspecified: Secondary | ICD-10-CM | POA: Insufficient documentation

## 2023-06-03 LAB — HEMOGLOBIN AND HEMATOCRIT, BLOOD
Hematocrit: 35.9 % (ref 34.0–46.6)
Hemoglobin: 10.4 g/dL — ABNORMAL LOW (ref 11.1–15.9)

## 2023-06-03 LAB — IRON,TIBC AND FERRITIN PANEL
Ferritin: 10 ng/mL — ABNORMAL LOW (ref 15–150)
Iron Saturation: 3 % — CL (ref 15–55)
Iron: 16 ug/dL — ABNORMAL LOW (ref 27–139)
Total Iron Binding Capacity: 507 ug/dL — ABNORMAL HIGH (ref 250–450)
UIBC: 491 ug/dL — ABNORMAL HIGH (ref 118–369)

## 2023-06-03 MED ORDER — SUTAB 1479-225-188 MG PO TABS: ORAL_TABLET | ORAL | 0 refills | Status: DC

## 2023-06-04 ENCOUNTER — Encounter (INDEPENDENT_AMBULATORY_CARE_PROVIDER_SITE_OTHER): Payer: Self-pay

## 2023-06-05 ENCOUNTER — Encounter (INDEPENDENT_AMBULATORY_CARE_PROVIDER_SITE_OTHER): Payer: Self-pay

## 2023-06-05 ENCOUNTER — Telehealth: Payer: Self-pay | Admitting: Pharmacy Technician

## 2023-06-05 NOTE — Telephone Encounter (Signed)
Auth Submission: NO AUTH NEEDED Site of care: Site of care: [facility] Payer: [employer] MEDICARE Medication & CPT/J Code(s) submitted: Feraheme (ferumoxytol) [id number] Route of submission (phone, fax, portal): PORTAL Phone # Fax # Auth type: Buy/Bill Units/visits requested: X2 Reference number: [id number] Approval from: 06/05/23 to 12/09/23

## 2023-06-09 ENCOUNTER — Encounter (HOSPITAL_COMMUNITY)
Admission: RE | Admit: 2023-06-09 | Discharge: 2023-06-09 | Disposition: A | Discharge status: Home / Self Care | Payer: Medicare PPO | Source: Ambulatory Visit | Attending: Gastroenterology | Admitting: Gastroenterology

## 2023-06-09 ENCOUNTER — Encounter (INDEPENDENT_AMBULATORY_CARE_PROVIDER_SITE_OTHER): Payer: Self-pay

## 2023-06-09 VITALS — BP 122/66 | HR 67 | Temp 98.3°F | Resp 18

## 2023-06-09 DIAGNOSIS — D509 Iron deficiency anemia, unspecified: Secondary | ICD-10-CM | POA: Diagnosis not present

## 2023-06-09 MED ORDER — SODIUM CHLORIDE 0.9 % IV SOLN
510.0000 mg | Freq: Once | INTRAVENOUS | Status: AC
  Administered 2023-06-09: 510 mg via INTRAVENOUS
  Filled 2023-06-09: qty 510

## 2023-06-09 MED ORDER — DIPHENHYDRAMINE HCL 25 MG PO CAPS
25.0000 mg | ORAL_CAPSULE | Freq: Once | ORAL | Status: AC
  Administered 2023-06-09: 25 mg via ORAL

## 2023-06-09 MED ORDER — ACETAMINOPHEN 325 MG PO TABS
650.0000 mg | ORAL_TABLET | Freq: Once | ORAL | Status: AC
  Administered 2023-06-09: 650 mg via ORAL

## 2023-06-09 NOTE — Progress Notes (Signed)
Diagnosis: Iron Deficiency Anemia  Provider:  Doylene Bode NP  Procedure: IV Infusion  IV Type: Peripheral, IV Location: L Antecubital  Feraheme (Ferumoxytol), Dose: 510 mg  Infusion Start Time: 0946  Infusion Stop Time: 1001  Post Infusion IV Care: Observation period completed  Discharge: Condition: Good, Destination: Home . AVS Provided  Performed by:  Arrie Senate, RN

## 2023-06-17 ENCOUNTER — Encounter (HOSPITAL_COMMUNITY)
Admission: RE | Admit: 2023-06-17 | Discharge: 2023-06-17 | Disposition: A | Discharge status: Home / Self Care | Payer: Medicare PPO | Source: Ambulatory Visit | Attending: Gastroenterology | Admitting: Gastroenterology

## 2023-06-17 VITALS — BP 115/61 | HR 70 | Temp 98.0°F | Resp 18

## 2023-06-17 DIAGNOSIS — D509 Iron deficiency anemia, unspecified: Secondary | ICD-10-CM | POA: Diagnosis not present

## 2023-06-17 MED ORDER — SODIUM CHLORIDE 0.9 % IV SOLN
510.0000 mg | Freq: Once | INTRAVENOUS | Status: AC
  Administered 2023-06-17: 510 mg via INTRAVENOUS
  Filled 2023-06-17: qty 510

## 2023-06-17 MED ORDER — DIPHENHYDRAMINE HCL 25 MG PO CAPS
25.0000 mg | ORAL_CAPSULE | Freq: Once | ORAL | Status: AC
  Administered 2023-06-17: 25 mg via ORAL

## 2023-06-17 MED ORDER — ACETAMINOPHEN 325 MG PO TABS
650.0000 mg | ORAL_TABLET | Freq: Once | ORAL | Status: AC
  Administered 2023-06-17: 650 mg via ORAL

## 2023-06-17 NOTE — Addendum Note (Signed)
Encounter addended by: Arrie Senate, RN on: 06/17/2023 1:33 PM  Actions taken: Therapy plan modified

## 2023-06-17 NOTE — Progress Notes (Signed)
Diagnosis: Iron Deficiency Anemia  Provider:  Doylene Bode NP  Procedure: IV Infusion  IV Type: Peripheral, IV Location: L Forearm  Feraheme (Ferumoxytol), Dose: 510 mg  Infusion Start Time: 1045  Infusion Stop Time  1100   Post Infusion IV Care: Observation period completed and Peripheral IV Discontinued  Discharge: Condition: Good, Destination: Home . AVS Declined  Performed by:  Marlow Baars [place], RN

## 2023-06-18 ENCOUNTER — Other Ambulatory Visit: Payer: Self-pay | Admitting: Family Medicine

## 2023-06-18 ENCOUNTER — Encounter (HOSPITAL_COMMUNITY): Payer: Medicare PPO

## 2023-06-18 DIAGNOSIS — K219 Gastro-esophageal reflux disease without esophagitis: Secondary | ICD-10-CM

## 2023-06-25 ENCOUNTER — Other Ambulatory Visit: Payer: Self-pay

## 2023-07-09 ENCOUNTER — Other Ambulatory Visit: Payer: Self-pay

## 2023-07-09 ENCOUNTER — Ambulatory Visit (HOSPITAL_BASED_OUTPATIENT_CLINIC_OR_DEPARTMENT_OTHER): Payer: Medicare PPO | Admitting: Anesthesiology

## 2023-07-09 ENCOUNTER — Ambulatory Visit (HOSPITAL_COMMUNITY)
Admission: RE | Admit: 2023-07-09 | Discharge: 2023-07-09 | Disposition: A | Discharge status: Home / Self Care | Payer: Medicare PPO | Attending: Gastroenterology | Admitting: Gastroenterology

## 2023-07-09 ENCOUNTER — Encounter (INDEPENDENT_AMBULATORY_CARE_PROVIDER_SITE_OTHER): Payer: Self-pay | Admitting: *Deleted

## 2023-07-09 ENCOUNTER — Encounter (HOSPITAL_COMMUNITY): Payer: Self-pay | Admitting: Gastroenterology

## 2023-07-09 ENCOUNTER — Encounter (HOSPITAL_COMMUNITY)
Admission: RE | Disposition: A | Discharge status: Home / Self Care | Payer: Self-pay | Source: Home / Self Care | Attending: Gastroenterology

## 2023-07-09 ENCOUNTER — Ambulatory Visit (HOSPITAL_COMMUNITY): Payer: Medicare PPO | Admitting: Anesthesiology

## 2023-07-09 DIAGNOSIS — K317 Polyp of stomach and duodenum: Secondary | ICD-10-CM

## 2023-07-09 DIAGNOSIS — K31811 Angiodysplasia of stomach and duodenum with bleeding: Secondary | ICD-10-CM | POA: Insufficient documentation

## 2023-07-09 DIAGNOSIS — Z7984 Long term (current) use of oral hypoglycemic drugs: Secondary | ICD-10-CM | POA: Insufficient documentation

## 2023-07-09 DIAGNOSIS — K219 Gastro-esophageal reflux disease without esophagitis: Secondary | ICD-10-CM | POA: Diagnosis not present

## 2023-07-09 DIAGNOSIS — D122 Benign neoplasm of ascending colon: Secondary | ICD-10-CM | POA: Diagnosis not present

## 2023-07-09 DIAGNOSIS — D128 Benign neoplasm of rectum: Secondary | ICD-10-CM

## 2023-07-09 DIAGNOSIS — D509 Iron deficiency anemia, unspecified: Secondary | ICD-10-CM | POA: Diagnosis not present

## 2023-07-09 DIAGNOSIS — E119 Type 2 diabetes mellitus without complications: Secondary | ICD-10-CM | POA: Insufficient documentation

## 2023-07-09 DIAGNOSIS — K621 Rectal polyp: Secondary | ICD-10-CM | POA: Diagnosis not present

## 2023-07-09 DIAGNOSIS — E039 Hypothyroidism, unspecified: Secondary | ICD-10-CM | POA: Diagnosis not present

## 2023-07-09 DIAGNOSIS — K589 Irritable bowel syndrome without diarrhea: Secondary | ICD-10-CM | POA: Insufficient documentation

## 2023-07-09 DIAGNOSIS — K635 Polyp of colon: Secondary | ICD-10-CM | POA: Diagnosis not present

## 2023-07-09 DIAGNOSIS — D126 Benign neoplasm of colon, unspecified: Secondary | ICD-10-CM

## 2023-07-09 DIAGNOSIS — Z6837 Body mass index (BMI) 37.0-37.9, adult: Secondary | ICD-10-CM | POA: Insufficient documentation

## 2023-07-09 DIAGNOSIS — I10 Essential (primary) hypertension: Secondary | ICD-10-CM | POA: Insufficient documentation

## 2023-07-09 HISTORY — PX: COLONOSCOPY WITH PROPOFOL: SHX5780

## 2023-07-09 HISTORY — PX: HOT HEMOSTASIS: SHX5433

## 2023-07-09 HISTORY — PX: POLYPECTOMY: SHX5525

## 2023-07-09 HISTORY — PX: ESOPHAGOGASTRODUODENOSCOPY (EGD) WITH PROPOFOL: SHX5813

## 2023-07-09 HISTORY — PX: HEMOSTASIS CLIP PLACEMENT: SHX6857

## 2023-07-09 LAB — HM COLONOSCOPY

## 2023-07-09 LAB — GLUCOSE, CAPILLARY: Glucose-Capillary: 128 mg/dL — ABNORMAL HIGH (ref 70–99)

## 2023-07-09 SURGERY — COLONOSCOPY WITH PROPOFOL
Anesthesia: General

## 2023-07-09 MED ORDER — OMEPRAZOLE 40 MG PO CPDR: 40.0000 mg | DELAYED_RELEASE_CAPSULE | Freq: Two times a day (BID) | ORAL | 0 refills | Status: DC

## 2023-07-09 MED ORDER — LIDOCAINE HCL 1 % IJ SOLN
INTRAMUSCULAR | Status: DC | PRN
  Administered 2023-07-09: 50 mg via INTRADERMAL

## 2023-07-09 MED ORDER — STERILE WATER FOR IRRIGATION IR SOLN
Status: DC | PRN
  Administered 2023-07-09: 60 mL

## 2023-07-09 MED ORDER — LACTATED RINGERS IV SOLN: INTRAVENOUS | Status: DC | PRN

## 2023-07-09 MED ORDER — PROPOFOL 10 MG/ML IV BOLUS
INTRAVENOUS | Status: DC | PRN
  Administered 2023-07-09: 40 mg via INTRAVENOUS
  Administered 2023-07-09: 100 mg via INTRAVENOUS

## 2023-07-09 MED ORDER — PROPOFOL 500 MG/50ML IV EMUL
INTRAVENOUS | Status: DC | PRN
  Administered 2023-07-09: 150 ug/kg/min via INTRAVENOUS

## 2023-07-09 MED ORDER — LACTATED RINGERS IV SOLN: INTRAVENOUS | Status: DC

## 2023-07-09 MED ORDER — GLUCAGON HCL RDNA (DIAGNOSTIC) 1 MG IJ SOLR
INTRAMUSCULAR | Status: DC | PRN
  Administered 2023-07-09: .25 mg via INTRAVENOUS

## 2023-07-09 NOTE — Anesthesia Preprocedure Evaluation (Signed)
Anesthesia Evaluation  Patient identified by MRN, date of birth, ID band Patient awake    Reviewed: Allergy & Precautions, H&P , NPO status , Patient's Chart, lab work & pertinent test results, reviewed documented beta blocker date and time   Airway Mallampati: II  TM Distance: >3 FB Neck ROM: full    Dental no notable dental hx.    Pulmonary neg pulmonary ROS   Pulmonary exam normal breath sounds clear to auscultation       Cardiovascular Exercise Tolerance: Good hypertension, negative cardio ROS  Rhythm:regular Rate:Normal     Neuro/Psych  Headaches negative neurological ROS  negative psych ROS   GI/Hepatic negative GI ROS, Neg liver ROS,GERD  ,,  Endo/Other  negative endocrine ROSdiabetesHypothyroidism  Morbid obesity  Renal/GU negative Renal ROS  negative genitourinary   Musculoskeletal   Abdominal   Peds  Hematology negative hematology ROS (+) Blood dyscrasia, anemia   Anesthesia Other Findings   Reproductive/Obstetrics negative OB ROS                             Anesthesia Physical Anesthesia Plan  ASA: 3  Anesthesia Plan: General   Post-op Pain Management:    Induction:   PONV Risk Score and Plan: Propofol infusion  Airway Management Planned:   Additional Equipment:   Intra-op Plan:   Post-operative Plan:   Informed Consent: I have reviewed the patients History and Physical, chart, labs and discussed the procedure including the risks, benefits and alternatives for the proposed anesthesia with the patient or authorized representative who has indicated his/her understanding and acceptance.     Dental Advisory Given  Plan Discussed with: CRNA  Anesthesia Plan Comments:        Anesthesia Quick Evaluation

## 2023-07-09 NOTE — Discharge Instructions (Addendum)
You are being discharged to home.  Resume your previous diet.  Continue your present medications.  We are waiting for your pathology results.  Take Prilosec (omeprazole) 40 mg by mouth twice a day for four weeks, then decrease to 20 mg daily. Do not take any aspirin, ibuprofen (including Advil, Motrin or Nuprin), naproxen (including Aleve), or any other non-steroidal anti-inflammatory drugs.  Your physician has recommended a repeat colonoscopy for surveillance based on pathology results.  Repeat CBC and iron stores on follow up appointment

## 2023-07-09 NOTE — Op Note (Addendum)
[facility] Patient Name: Shelly [name] Procedure Date: 07/09/2023 12:11 PM MRN: [id number] Date of Birth: 07/02/1956 Attending MD: Katrinka Blazing , , [id number] CSN: [id number] Age: 67 Admit Type: Outpatient Procedure:                Upper GI endoscopy Indications:              Iron deficiency anemia Providers:                Katrinka Blazing, Sheran Fava, Angelica Ran,                            Kristine L. Jessee Avers, Technician, Dyann Ruddle Referring MD:              Medicines:                Monitored Anesthesia Care Complications:            No immediate complications. Estimated Blood Loss:     Estimated blood loss: none. Procedure:                Pre-Anesthesia Assessment:                           - Prior to the procedure, a History and Physical                            was performed, and patient medications, allergies                            and sensitivities were reviewed. The patient's                            tolerance of previous anesthesia was reviewed.                           - The risks and benefits of the procedure and the                            sedation options and risks were discussed with the                            patient. All questions were answered and informed                            consent was obtained.                           - ASA Grade Assessment: II - A patient with mild                            systemic disease.                           After obtaining informed consent, the endoscope was                            passed under direct vision.  Throughout the                            procedure, the patient's blood pressure, pulse, and                            oxygen saturations were monitored continuously. The                            GIF-H190 ([id number]) scope was introduced through the                            mouth, and advanced to the second part of duodenum.                            The upper GI endoscopy  was accomplished without                            difficulty. The patient tolerated the procedure                            well. Scope In: 12:29:23 PM Scope Out: 1:05:46 PM Total Procedure Duration: 0 hours 36 minutes 23 seconds  Findings:      The esophagus was normal.      Two medium angiodysplastic lesions with bleeding were found in the       gastric body. Coagulation for hemostasis using argon plasma at 0.3       liters/minute and 20 watts was successful.      A single 5 mm sessile polyp with no bleeding and stigmata of recent       bleeding was found in the gastric antrum. The polyp was removed with a       hot snare. Resection and retrieval were complete. To prevent bleeding       after the polypectomy, two hemostatic Ultra clips were successfully       placed. Clip manufacturer: [employer]. There was no bleeding at       the end of the procedure.      The examined duodenum was normal. Impression:               - Normal esophagus.                           - Two bleeding angiodysplastic lesions in the                            stomach. Treated with argon plasma coagulation                            (APC).                           - A single gastric polyp. Resected and retrieved.                            Clips were placed. Clip manufacturer: [employer].                           - Normal examined duodenum. Moderate Sedation:      Per Anesthesia Care Recommendation:           - Discharge patient to home (ambulatory).                           - Resume previous diet.                           - Continue present medications.                           - Await pathology results.                           - Use Prilosec (omeprazole) 40 mg PO BID for 4                            weeks, then decrease to 20 mg daily.                           - No aspirin, ibuprofen, naproxen, or other                            non-steroidal  anti-inflammatory drugs. Procedure Code(s):        --- Professional ---                           [phone], 59, Esophagogastroduodenoscopy, flexible,                            transoral; with control of bleeding, any method                           43251, Esophagogastroduodenoscopy, flexible,                            transoral; with removal of tumor(s), polyp(s), or                            other lesion(s) by snare technique Diagnosis Code(s):        --- Professional ---                           K31.811, Angiodysplasia of stomach and duodenum                            with bleeding                           K31.7, Polyp of stomach and duodenum                           D50.9, Iron deficiency anemia, unspecified CPT copyright 2022 American Medical Association. All rights reserved. The codes  documented in this report are preliminary and upon coder review may  be revised to meet current compliance requirements. Katrinka Blazing, MD Katrinka Blazing,  07/09/2023 1:11:51 PM This report has been signed electronically. Number of Addenda: 0

## 2023-07-09 NOTE — H&P (Signed)
Shelly [name] is an 67 y.o. female.   Chief Complaint: iron deficiency anemia HPI: Shelly [name] with PMH DM, basl cell cancer, GERD, hypothyroidism, IBS, coming for evaluation of iron deficiency anemia.   The patient denies having any complaints such as melena, hematochezia, abdominal pain or distention, change in her bowel movement consistency or frequency, no changes in weight recently.  No family history of colorectal cancer.   Past Medical History:  Diagnosis Date   Cancer (HCC)    Basal Cell    Diabetes mellitus without complication (HCC)    GERD (gastroesophageal reflux disease)    Headache(784.0)    Hypothyroidism    Irritable bowel syndrome (IBS)    Primary osteoarthritis of left knee 01/05/2019   Thrombopenia (HCC) 01/07/2019   Chronic by lab review    Past Surgical History:  Procedure Laterality Date   ABDOMINAL HYSTERECTOMY     APPENDECTOMY  1991   COLONOSCOPY N/A 06/19/2015   Procedure: COLONOSCOPY;  Surgeon: Malissa Hippo, MD;  [facility];  Service: Endoscopy;  Laterality: N/A;  8:30-9:30   COLONOSCOPY WITH PROPOFOL N/A 09/20/2020   Procedure: COLONOSCOPY WITH PROPOFOL;  Surgeon: Malissa Hippo, MD;  [facility];  Service: Endoscopy;  Laterality: N/A;  1015   cyst removed     right arm   FLEXIBLE SIGMOIDOSCOPY  07/23/2011   Procedure: FLEXIBLE SIGMOIDOSCOPY;  Surgeon: Malissa Hippo, MD;  [facility]: [facility];  Service: Endoscopy;  Laterality: N/A;   TONSILLECTOMY AND ADENOIDECTOMY      Family History  Problem Relation Age of Onset   Breast cancer Mother 37   Atrial fibrillation Mother 52       s/p ablation   Breast cancer Maternal Grandmother 64   Colon cancer Maternal Aunt 65   Colon cancer Maternal Uncle 23   Social History:  reports that she has never smoked. She has never used smokeless tobacco. She reports current alcohol use. She reports that she does not use drugs.  Allergies:  Allergies  Allergen Reactions    Propoxyphene N-Acetaminophen Hives and Palpitations    Medications Prior to Admission  Medication Sig Dispense Refill   Artificial Tear Solution (SOOTHE XP OP) Place 1 drop into both eyes 3 (three) times daily as needed (dry/irritated eyes.).     cyanocobalamin (VITAMIN B12) 1000 MCG/ML injection Inject 1 mL (1,000 mcg total) into the muscle every 30 (thirty) days. 3 mL 3   fexofenadine (ALLEGRA) 180 MG tablet Take 180 mg by mouth in the morning.     hyoscyamine (LEVSIN SL) 0.125 MG SL tablet Take 1 tablet (0.125 mg total) by mouth 2 (two) times daily as needed. PLACE 1 TABLET UNDER THE TONGUE 2 TIMES DAILY AS NEEDED. (Patient taking differently: Take 0.125 mg by mouth in the morning.) 90 tablet 3   iron polysaccharides (NIFEREX) 150 MG capsule Take 1 capsule (150 mg total) by mouth daily. (Patient taking differently: Take 150 mg by mouth See admin instructions. Take 1 capsule (150 mg) by mouth after supper every other day.) 60 capsule 5   levothyroxine (SYNTHROID) 100 MCG tablet Take 1 tablet (100 mcg total) by mouth daily before breakfast. 90 tablet 3   metFORMIN (GLUCOPHAGE) 500 MG tablet TAKE 1 TABLET BY MOUTH 2 TIMES A DAY WITH A MEAL. 180 tablet 0   omeprazole (PRILOSEC) 20 MG capsule TAKE 1 CAPSULE BY MOUTH ONCE DAILY. 90 capsule 3   ibuprofen (ADVIL) 200 MG tablet Take 400 mg by  mouth every 8 (eight) hours as needed (pain.).     Sodium Sulfate-Mag Sulfate-KCl (SUTAB) [phone] MG TABS As directed 24 tablet 0   Syringe/Needle, Disp, (SYRINGE 3CC/20GX1") 20G X 1" 3 ML MISC 1 each by Does not apply route every 30 (thirty) days. 52 each 0    Results for orders placed or performed during the hospital encounter of 07/09/23 (from the past 48 hour(s))  Glucose, capillary     Status: Abnormal   Collection Time: 07/09/23 12:02 PM  Result Value Ref Range   Glucose-Capillary 128 (H) 70 - 99 mg/dL    Comment: Glucose reference range applies only to samples taken after fasting for at least 8  hours.   No results found.  Review of Systems  All other systems reviewed and are negative.   Blood pressure 130/72, pulse 78, temperature 98 [name] (36.7 C), temperature source Oral, resp. rate 20, height 5\' 8"  (1.727 m), weight 112 kg, SpO2 94%. Physical Exam  GENERAL: The patient is AO x3, in no acute distress. HEENT: Head is normocephalic and atraumatic. EOMI are intact. Mouth is well hydrated and without lesions. NECK: Supple. No masses LUNGS: Clear to auscultation. No presence of rhonchi/wheezing/rales. Adequate chest expansion HEART: RRR, normal s1 and s2. ABDOMEN: Soft, nontender, no guarding, no peritoneal signs, and nondistended. BS +. No masses. EXTREMITIES: Without any cyanosis, clubbing, rash, lesions or edema. NEUROLOGIC: AOx3, no focal motor deficit. SKIN: no jaundice, no rashes  Assessment/Plan Shelly [name] with PMH DM, basl cell cancer, GERD, hypothyroidism, IBS, coming for evaluation of iron deficiency anemia.  Will proceed with EGD and colonoscopy. [name], MD 07/09/2023, 12:21 PM

## 2023-07-09 NOTE — Transfer of Care (Signed)
Immediate Anesthesia Transfer of Care Note  Patient: Shelly [name]  Procedure(s) Performed: COLONOSCOPY WITH PROPOFOL ESOPHAGOGASTRODUODENOSCOPY (EGD) WITH PROPOFOL POLYPECTOMY HOT HEMOSTASIS (ARGON PLASMA COAGULATION/BICAP) HEMOSTASIS CLIP PLACEMENT  Patient Location: Endoscopy Unit  Anesthesia Type:General  Level of Consciousness: awake  Airway & Oxygen Therapy: Patient Spontanous Breathing  Post-op Assessment: Report given to RN  Post vital signs: Reviewed  Last Vitals:  Vitals Value Taken Time  BP    Temp    Pulse    Resp    SpO2      Last Pain:  Vitals:   07/09/23 1223  TempSrc:   PainSc: 0-No pain      Patients Stated Pain Goal: 10 (07/09/23 1138)  Complications: No notable events documented.

## 2023-07-09 NOTE — Anesthesia Postprocedure Evaluation (Signed)
Anesthesia Post Note  Patient: Shelly [name]  Procedure(s) Performed: COLONOSCOPY WITH PROPOFOL ESOPHAGOGASTRODUODENOSCOPY (EGD) WITH PROPOFOL POLYPECTOMY HOT HEMOSTASIS (ARGON PLASMA COAGULATION/BICAP) HEMOSTASIS CLIP PLACEMENT  Patient location during evaluation: Endoscopy Anesthesia Type: General Level of consciousness: awake and alert Pain management: pain level controlled Vital Signs Assessment: post-procedure vital signs reviewed and stable Respiratory status: spontaneous breathing Cardiovascular status: blood pressure returned to baseline and stable Postop Assessment: no apparent nausea or vomiting Anesthetic complications: no   No notable events documented.   Last Vitals:  Vitals:   07/09/23 1338 07/09/23 1339  BP:    Pulse: 82   Resp: 15 16  Temp:    SpO2: 97%     Last Pain:  Vitals:   07/09/23 1223  TempSrc:   PainSc: 0-No pain                 Bela Nyborg

## 2023-07-09 NOTE — Op Note (Signed)
[facility] Patient Name: Shelly [name] Procedure Date: 07/09/2023 1:08 PM MRN: [id number] Date of Birth: 1955-12-24 Attending MD: Katrinka Blazing , , [id number] CSN: [id number] Age: 67 Admit Type: Outpatient Procedure:                Colonoscopy Indications:              Iron deficiency anemia Providers:                Katrinka Blazing, Angelica Ran, Elinor Parkinson,                            Dyann Ruddle Referring MD:              Medicines:                Monitored Anesthesia Care Complications:            No immediate complications. Estimated Blood Loss:     Estimated blood loss: none. Procedure:                Pre-Anesthesia Assessment:                           - Prior to the procedure, a History and Physical                            was performed, and patient medications, allergies                            and sensitivities were reviewed. The patient's                            tolerance of previous anesthesia was reviewed.                           - The risks and benefits of the procedure and the                            sedation options and risks were discussed with the                            patient. All questions were answered and informed                            consent was obtained.                           - ASA Grade Assessment: II - A patient with mild                            systemic disease.                           After obtaining informed consent, the colonoscope                            was passed under direct vision. Throughout the  procedure, the patient's blood pressure, pulse, and                            oxygen saturations were monitored continuously. The                            [id number]-HQ190L ([id number]) scope was introduced through                            the anus and advanced to the the terminal ileum.                            The colonoscopy was performed without difficulty.                             The patient tolerated the procedure well. The                            quality of the bowel preparation was good. Scope In: 1:13:04 PM Scope Out: 1:36:39 PM Scope Withdrawal Time: 0 hours 18 minutes 45 seconds  Total Procedure Duration: 0 hours 23 minutes 35 seconds  Findings:      The perianal and digital rectal examinations were normal.      The terminal ileum appeared normal.      A 1 mm polyp was found in the ascending colon. The polyp was sessile.       The polyp was removed with a cold biopsy forceps. Resection and       retrieval were complete.      A 2 mm polyp was found in the rectum. The polyp was sessile. The polyp       was removed with a cold snare. Resection and retrieval were complete.      The retroflexed view of the distal rectum and anal verge was normal and       showed no anal or rectal abnormalities. Impression:               - The examined portion of the ileum was normal.                           - One 1 mm polyp in the ascending colon, removed                            with a cold biopsy forceps. Resected and retrieved.                           - One 2 mm polyp in the rectum, removed with a cold                            snare. Resected and retrieved.                           - The distal rectum and anal verge are normal on  retroflexion view. Moderate Sedation:      Per Anesthesia Care Recommendation:           - Discharge patient to home (ambulatory).                           - Resume previous diet.                           - Continue present medications.                           - Await pathology results.                           - Repeat colonoscopy for surveillance based on                            pathology results.                           - Continue iron supplementation                           - Repeat CBC and iron stores on follow up                            appointment, Procedure Code(s):        ---  Professional ---                           [phone], Colonoscopy, flexible; with removal of                            tumor(s), polyp(s), or other lesion(s) by snare                            technique                           [id number], 59, Colonoscopy, flexible; with biopsy,                            single or multiple Diagnosis Code(s):        --- Professional ---                           D12.2, Benign neoplasm of ascending colon                           D12.8, Benign neoplasm of rectum                           D50.9, Iron deficiency anemia, unspecified CPT copyright 2022 American Medical Association. All rights reserved. The codes documented in this report are preliminary and upon coder review may  be revised to meet current compliance requirements. Katrinka Blazing, MD Katrinka Blazing,  07/09/2023 1:46:25 PM This report has been signed electronically. Number of Addenda: 0

## 2023-07-10 ENCOUNTER — Encounter: Payer: Self-pay | Admitting: Family Medicine

## 2023-07-10 DIAGNOSIS — D126 Benign neoplasm of colon, unspecified: Secondary | ICD-10-CM | POA: Insufficient documentation

## 2023-07-11 ENCOUNTER — Encounter (HOSPITAL_COMMUNITY): Payer: Self-pay | Admitting: Gastroenterology

## 2023-07-11 ENCOUNTER — Encounter (INDEPENDENT_AMBULATORY_CARE_PROVIDER_SITE_OTHER): Payer: Self-pay | Admitting: *Deleted

## 2023-07-29 ENCOUNTER — Other Ambulatory Visit: Payer: Self-pay | Admitting: Family Medicine

## 2023-08-11 ENCOUNTER — Other Ambulatory Visit: Payer: Self-pay | Admitting: Family Medicine

## 2023-08-11 DIAGNOSIS — K589 Irritable bowel syndrome without diarrhea: Secondary | ICD-10-CM

## 2023-08-11 DIAGNOSIS — E039 Hypothyroidism, unspecified: Secondary | ICD-10-CM

## 2023-08-19 ENCOUNTER — Other Ambulatory Visit (INDEPENDENT_AMBULATORY_CARE_PROVIDER_SITE_OTHER): Payer: Self-pay | Admitting: Gastroenterology

## 2023-08-19 ENCOUNTER — Encounter: Payer: Medicare PPO | Admitting: Family Medicine

## 2023-08-25 ENCOUNTER — Other Ambulatory Visit: Payer: Self-pay | Admitting: Family Medicine

## 2023-08-25 DIAGNOSIS — Z1231 Encounter for screening mammogram for malignant neoplasm of breast: Secondary | ICD-10-CM

## 2023-09-15 ENCOUNTER — Ambulatory Visit (INDEPENDENT_AMBULATORY_CARE_PROVIDER_SITE_OTHER): Payer: Medicare PPO

## 2023-09-15 VITALS — Wt 247.0 lb

## 2023-09-15 DIAGNOSIS — Z0000 Encounter for general adult medical examination without abnormal findings: Secondary | ICD-10-CM

## 2023-09-15 NOTE — Progress Notes (Signed)
Subjective:   Shelly [name] is a 67 y.o. female who presents for Medicare Annual (Subsequent) preventive examination.  Visit Complete: Virtual  I connected with  [name] on 09/15/23 by a audio enabled telemedicine application and verified that I am speaking with the correct person using two identifiers.  Patient Location: Home  Provider Location: Office/Clinic  I discussed the limitations of evaluation and management by telemedicine. The patient expressed understanding and agreed to proceed.   Because this visit was a virtual/telehealth visit, some criteria may be missing or patient reported. Any vitals not documented were not able to be obtained and vitals that have been documented are patient reported.    Cardiac Risk Factors include: advanced age (>2men, >19 women);diabetes mellitus;obesity (BMI >30kg/m2)     Objective:    Today's Vitals   09/15/23 1133  Weight: 247 lb (112 kg)   Body mass index is 37.56 kg/m.     09/15/2023   11:36 AM 07/09/2023   11:39 AM 09/09/2022   11:11 AM 08/23/2021    9:37 AM 09/18/2020   11:00 AM 06/19/2015    7:39 AM  Advanced Directives  Does Patient Have a Medical Advance Directive? No No No No No No  Would patient like information on creating a medical advance directive? No - Patient declined No - Patient declined Yes (MAU/Ambulatory/Procedural Areas - Information given) Yes (MAU/Ambulatory/Procedural Areas - Information given) No - Patient declined No - patient declined information    Current Medications (verified) Outpatient Encounter Medications as of 09/15/2023  Medication Sig   Artificial Tear Solution (SOOTHE XP OP) Place 1 drop into both eyes 3 (three) times daily as needed (dry/irritated eyes.).   cyanocobalamin (VITAMIN B12) 1000 MCG/ML injection Inject 1 mL (1,000 mcg total) into the muscle every 30 (thirty) days.   fexofenadine (ALLEGRA) 180 MG tablet Take 180 mg by mouth in the morning.   hyoscyamine  (LEVSIN SL) 0.125 MG SL tablet PLACE 1 TABLET UNDER THE TONGUE 3 TIMES DAILY AS NEEDED   ibuprofen (ADVIL) 200 MG tablet Take 400 mg by mouth every 8 (eight) hours as needed (pain.).   iron polysaccharides (NIFEREX) 150 MG capsule Take 1 capsule (150 mg total) by mouth daily. (Patient taking differently: Take 150 mg by mouth See admin instructions. Take 1 capsule (150 mg) by mouth after supper every other day.)   levothyroxine (SYNTHROID) 100 MCG tablet TAKE 1 TABLET ONCE DAILY BEFORE BREAKFAST.   metFORMIN (GLUCOPHAGE) 500 MG tablet TAKE 1 TABLET BY MOUTH 2 TIMES A DAY WITH A MEAL.   omeprazole (PRILOSEC) 40 MG capsule take 1 capsule (40 MILLIGRAM total) by mouth 2 (two) times daily.   rosuvastatin (CRESTOR) 5 MG tablet TAKE 1 TABLET BY MOUTH AT BEDTIME.   Syringe/Needle, Disp, (SYRINGE 3CC/20GX1") 20G X 1" 3 ML MISC 1 each by Does not apply route every 30 (thirty) days.   [DISCONTINUED] omeprazole (PRILOSEC) 20 MG capsule TAKE 1 CAPSULE BY MOUTH ONCE DAILY.   No facility-administered encounter medications on file as of 09/15/2023.    Allergies (verified) Propoxyphene n-acetaminophen   History: Past Medical History:  Diagnosis Date   Cancer (HCC)    Basal Cell    Diabetes mellitus without complication (HCC)    GERD (gastroesophageal reflux disease)    Headache(784.0)    Hypothyroidism    Irritable bowel syndrome (IBS)    Primary osteoarthritis of left knee 01/05/2019   Thrombopenia (HCC) 01/07/2019   Chronic by lab review   Past Surgical History:  Procedure Laterality Date   ABDOMINAL HYSTERECTOMY     APPENDECTOMY  1991   COLONOSCOPY N/A 06/19/2015   Procedure: COLONOSCOPY;  Surgeon: Malissa Hippo, MD;  [facility];  Service: Endoscopy;  Laterality: N/A;  8:30-9:30   COLONOSCOPY WITH PROPOFOL N/A 09/20/2020   Procedure: COLONOSCOPY WITH PROPOFOL;  Surgeon: Malissa Hippo, MD;  Location: [facility];  Service: Endoscopy;  Laterality: N/A;  1015   COLONOSCOPY WITH  PROPOFOL N/A 07/09/2023   Procedure: COLONOSCOPY WITH PROPOFOL;  Surgeon: [name], MD;  [facility]: [facility];  Service: Gastroenterology;  Laterality: N/A;  8:45AM;ASA 2   cyst removed     right arm   ESOPHAGOGASTRODUODENOSCOPY (EGD) WITH PROPOFOL N/A 07/09/2023   Procedure: ESOPHAGOGASTRODUODENOSCOPY (EGD) WITH PROPOFOL;  Surgeon: [name], MD;  [facility];  Service: Gastroenterology;  Laterality: N/A;  8:45AM;ASA 2   FLEXIBLE SIGMOIDOSCOPY  07/23/2011   Procedure: FLEXIBLE SIGMOIDOSCOPY;  Surgeon: Malissa Hippo, MD;  [facility];  Service: Endoscopy;  Laterality: N/A;   HEMOSTASIS CLIP PLACEMENT  07/09/2023   Procedure: HEMOSTASIS CLIP PLACEMENT;  Surgeon: [name], MD;  [facility];  Service: Gastroenterology;;   HOT HEMOSTASIS  07/09/2023   Procedure: HOT HEMOSTASIS (ARGON PLASMA COAGULATION/BICAP);  Surgeon: Marguerita Merles, Reuel Boom, MD;  [facility];  Service: Gastroenterology;;   POLYPECTOMY  07/09/2023   Procedure: POLYPECTOMY;  Surgeon: Marguerita Merles, Reuel Boom, MD;  [facility];  Service: Gastroenterology;;   TONSILLECTOMY AND ADENOIDECTOMY     Family History  Problem Relation Age of Onset   Breast cancer Mother 36   Atrial fibrillation Mother 76       s/p ablation   Breast cancer Maternal Grandmother 88   Colon cancer Maternal Aunt 80   Colon cancer Maternal Uncle 24   Social History   Socioeconomic History   Marital status: Married    Spouse name: Not on file   Number of children: Not on file   Years of education: Not on file   Highest education level: Not on file  Occupational History   Not on file  Tobacco Use   Smoking status: Never   Smokeless tobacco: Never  Vaping Use   Vaping status: Never Used  Substance and Sexual Activity   Alcohol use: Yes    Comment: social   Drug use: No   Sexual activity: Yes    Birth control/protection: Surgical   Other Topics Concern   Not on file  Social History Narrative   Not on file   Social Determinants of Health   Financial Resource Strain: Low Risk  (09/15/2023)   Overall Financial Resource Strain (CARDIA)    Difficulty of Paying Living Expenses: Not hard at all  Food Insecurity: No Food Insecurity (09/15/2023)   Hunger Vital Sign    Worried About Running Out of Food in the Last Year: Never true    Ran Out of Food in the Last Year: Never true  Transportation Needs: No Transportation Needs (09/15/2023)   PRAPARE - Administrator, Civil Service (Medical): No    Lack of Transportation (Non-Medical): No  Physical Activity: Sufficiently Active (09/15/2023)   Exercise Vital Sign    Days of Exercise per Week: 7 days    Minutes of Exercise per Session: 30 min  Stress: No Stress Concern Present (09/15/2023)   [employer] of Occupational Health - Occupational Stress Questionnaire    Feeling of Stress :  Not at all  Social Connections: Socially Integrated (09/15/2023)   Social Connection and Isolation Panel [NHANES]    Frequency of Communication with Friends and Family: More than three times a week    Frequency of Social Gatherings with Friends and Family: More than three times a week    Attends Religious Services: More than 4 times per year    Active Member of [employer] or Organizations: Yes    Attends Banker Meetings: 1 to 4 times per year    Marital Status: Married    Tobacco Counseling Counseling given: Not Answered   Clinical Intake:  Pre-visit preparation completed: Yes  Pain : No/denies pain     BMI - recorded: 37.36 Nutritional Status: BMI > 30  Obese Nutritional Risks: None Diabetes: Yes CBG done?: No Did pt. bring in CBG monitor from home?: No  How often do you need to have someone help you when you read instructions, pamphlets, or other written materials from your doctor or pharmacy?: 1 - Never  Interpreter Needed?: No  Information entered  by :: Lanier Ensign, LPN   Activities of Daily Living    09/15/2023   11:34 AM  In your present state of health, do you have any difficulty performing the following activities:  Hearing? 0  Vision? 0  Difficulty concentrating or making decisions? 0  Walking or climbing stairs? 0  Dressing or bathing? 0  Doing errands, shopping? 0  Preparing Food and eating ? N  Using the Toilet? N  In the past six months, have you accidently leaked urine? Y  Comment wears a pantyliner  Do you have problems with loss of bowel control? N  Managing your Medications? N  Managing your Finances? N  Housekeeping or managing your Housekeeping? N    Patient Care Team: Willow Ora, MD as PCP - General (Family Medicine) Malissa Hippo, MD (Inactive) (Gastroenterology) Cherlyn Roberts, MD (Dermatology) Terance Hart, MD as Consulting Physician (Orthopedic Surgery) Dr. Laveda Norman as Consulting Physician (Ophthalmology)  Indicate any recent Medical Services you may have received from other than [facility] providers in the past year (date may be approximate).     Assessment:   This is a routine wellness examination for [place].  Hearing/Vision screen Hearing Screening - Comments:: Pt denies any hearing issues  Vision Screening - Comments:: Pt follows up with my eye dr in [facility]    Goals Addressed             This Visit's Progress    Patient Stated         Depression Screen    09/15/2023   11:39 AM 02/17/2023    9:37 AM 11/12/2022    9:35 AM 09/09/2022   11:09 AM 08/07/2022    9:54 AM 08/23/2021    9:36 AM 03/13/2021    9:50 AM  PHQ 2/9 Scores  PHQ - 2 Score 0 0 0 1 0 0 0    Fall Risk    09/15/2023   11:36 AM 02/17/2023    9:37 AM 11/12/2022    9:35 AM 09/09/2022   11:12 AM 08/07/2022    9:54 AM  Fall Risk   Falls in the past year? 0 0 0 0 0  Number falls in past yr: 0 0 0 0 0  Injury with Fall? 0 0 0 0 0  Risk for fall due to : Impaired vision No Fall Risks No Fall Risks  Impaired vision No Fall Risks  Follow up Falls prevention  discussed Falls evaluation completed Falls evaluation completed Falls prevention discussed Falls evaluation completed    MEDICARE RISK AT HOME: Medicare Risk at Home Any stairs in or around the home?: Yes If so, are there any without handrails?: No Home free of loose throw rugs in walkways, pet beds, electrical cords, etc?: Yes Adequate lighting in your home to reduce risk of falls?: Yes Life alert?: No Use of a cane, walker or w/c?: No Grab bars in the bathroom?: No Shower chair or bench in shower?: No Elevated toilet seat or a handicapped toilet?: No  TIMED UP AND GO:  Was the test performed?  No    Cognitive Function:        09/15/2023   11:58 AM 09/09/2022   11:14 AM 08/23/2021    9:40 AM  6CIT Screen  What Year? 0 points 0 points 0 points  What month? 0 points 0 points 0 points  What time? 0 points 0 points 0 points  Count back from 20 0 points 0 points 0 points  Months in reverse 0 points 0 points 0 points  Repeat phrase 0 points 0 points 0 points  Total Score 0 points 0 points 0 points    Immunizations Immunization History  Administered Date(s) Administered   Fluad Quad(high Dose 65+) 10/02/2021, 10/07/2022   Influenza Whole 10/09/2010   Influenza,inj,Quad PF,6+ Mos 09/20/2014, 09/29/2017, 10/05/2018, 09/15/2019   Influenza-Unspecified 10/02/2016, 09/29/2017, 08/18/2023   Moderna Covid-19 Vaccine Bivalent Booster 61yrs & up 09/18/2021   Moderna SARS-COV2 Booster Vaccination 10/07/2022   Moderna Sars-Covid-2 Vaccination 12/30/2019, 02/04/2020, 10/17/2020   Pneumococcal Conjugate-13 09/15/2019   Pneumococcal Polysaccharide-23 01/16/2018   Td 12/09/1998, 01/07/2011   Tdap 09/20/2014   Varicella Zoster Immune Globulin 10/07/2014   Zoster Recombinant(Shingrix) 01/16/2018, 10/05/2018   Zoster, Live 09/08/2014    TDAP status: Up to date  Flu Vaccine status: Due, Education has been provided regarding the  importance of this vaccine. Advised may receive this vaccine at local pharmacy or Health Dept. Aware to provide a copy of the vaccination record if obtained from local pharmacy or Health Dept. Verbalized acceptance and understanding.  Pneumococcal vaccine status: Due, Education has been provided regarding the importance of this vaccine. Advised may receive this vaccine at local pharmacy or Health Dept. Aware to provide a copy of the vaccination record if obtained from local pharmacy or Health Dept. Verbalized acceptance and understanding.  Covid-19 vaccine status: Information provided on how to obtain vaccines.   Qualifies for Shingles Vaccine? Yes   Zostavax completed Yes   Shingrix Completed?: Yes  Screening Tests Health Maintenance  Topic Date Due   Diabetic kidney evaluation - Urine ACR  08/08/2023   FOOT EXAM  08/08/2023   COVID-19 Vaccine (5 - 2023-24 season) 08/10/2023   HEMOGLOBIN A1C  08/20/2023   MAMMOGRAM  10/08/2023   OPHTHALMOLOGY EXAM  10/30/2023   Diabetic kidney evaluation - eGFR measurement  11/13/2023   Pneumonia Vaccine 32+ Years old (3 of 3 - PPSV23 or PCV20) 09/14/2024   Medicare Annual Wellness (AWV)  09/14/2024   DTaP/Tdap/Td (4 - Td or Tdap) 09/20/2024   DEXA SCAN  08/29/2025   Colonoscopy  07/08/2030   INFLUENZA VACCINE  Completed   Hepatitis C Screening  Completed   Zoster Vaccines- Shingrix  Completed   HPV VACCINES  Aged Out    Health Maintenance  Health Maintenance Due  Topic Date Due   Diabetic kidney evaluation - Urine ACR  08/08/2023   FOOT EXAM  08/08/2023  COVID-19 Vaccine (5 - 2023-24 season) 08/10/2023   HEMOGLOBIN A1C  08/20/2023    Colorectal cancer screening: Type of screening: Colonoscopy. Completed 07/09/23. Repeat every 7 years  Mammogram status: Completed scheduled 10/10/23. Repeat every year  Bone Density status: Completed 08/29/20. Results reflect: Bone density results: NORMAL. Repeat every 2 years.  Additional  Screening:  Hepatitis C Screening:  Completed 01/05/19  Vision Screening: Recommended annual ophthalmology exams for early detection of glaucoma and other disorders of the eye. Is the patient up to date with their annual eye exam?  Yes  Who is the provider or what is the name of the office in which the patient attends annual eye exams? My eye dr  If pt is not established with a provider, would they like to be referred to a provider to establish care? No .   Dental Screening: Recommended annual dental exams for proper oral hygiene  Diabetic Foot Exam: Diabetic Foot Exam: Overdue, Pt has been advised about the importance in completing this exam. Pt is scheduled for diabetic foot exam on next appt.  Community Resource Referral / Chronic Care Management: CRR required this visit?  No   CCM required this visit?  No     Plan:     I have personally reviewed and noted the following in the patient's chart:   Medical and social history Use of alcohol, tobacco or illicit drugs  Current medications and supplements including opioid prescriptions. Patient is not currently taking opioid prescriptions. Functional ability and status Nutritional status Physical activity Advanced directives List of other physicians Hospitalizations, surgeries, and ER visits in previous 12 months Vitals Screenings to include cognitive, depression, and falls Referrals and appointments  In addition, I have reviewed and discussed with patient certain preventive protocols, quality metrics, and best practice recommendations. A written personalized care plan for preventive services as well as general preventive health recommendations were provided to patient.     Marzella Schlein, LPN   16/12/958   After Visit Summary: (MyChart) Due to this being a telephonic visit, the after visit summary with patients personalized plan was offered to patient via MyChart   Nurse Notes: none

## 2023-09-15 NOTE — Patient Instructions (Signed)
Shelly [name] , Thank you for taking time to come for your Medicare Wellness Visit. I appreciate your ongoing commitment to your health goals. Please review the following plan we discussed and let me know if I can assist you in the future.   Referrals/Orders/Follow-Ups/Clinician Recommendations: Aim for 30 minutes of exercise or brisk walking, 6-8 glasses of water, and 5 servings of fruits and vegetables each day.   This is a list of the screening recommended for you and due dates:  Health Maintenance  Topic Date Due   Yearly kidney health urinalysis for diabetes  08/08/2023   Complete foot exam   08/08/2023   COVID-19 Vaccine (5 - 2023-24 season) 08/10/2023   Hemoglobin A1C  08/20/2023   Mammogram  10/08/2023   Eye exam for diabetics  10/30/2023   Yearly kidney function blood test for diabetes  11/13/2023   Pneumonia Vaccine (3 of 3 - PPSV23 or PCV20) 09/14/2024   Medicare Annual Wellness Visit  09/14/2024   DTaP/Tdap/Td vaccine (4 - Td or Tdap) 09/20/2024   DEXA scan (bone density measurement)  08/29/2025   Colon Cancer Screening  07/08/2030   Flu Shot  Completed   Hepatitis C Screening  Completed   Zoster (Shingles) Vaccine  Completed   HPV Vaccine  Aged Out    Advanced directives: (Declined) Advance directive discussed with you today. Even though you declined this today, please call our office should you change your mind, and we can give you the proper paperwork for you to fill out.  Next Medicare Annual Wellness Visit scheduled for next year: Yes

## 2023-09-17 ENCOUNTER — Ambulatory Visit: Payer: Medicare PPO | Admitting: Family Medicine

## 2023-09-17 ENCOUNTER — Encounter: Payer: Self-pay | Admitting: Family Medicine

## 2023-09-17 VITALS — BP 128/70 | HR 71 | Temp 97.8°F | Ht 68.0 in | Wt 245.0 lb

## 2023-09-17 DIAGNOSIS — D696 Thrombocytopenia, unspecified: Secondary | ICD-10-CM | POA: Diagnosis not present

## 2023-09-17 DIAGNOSIS — D126 Benign neoplasm of colon, unspecified: Secondary | ICD-10-CM | POA: Diagnosis not present

## 2023-09-17 DIAGNOSIS — R030 Elevated blood-pressure reading, without diagnosis of hypertension: Secondary | ICD-10-CM

## 2023-09-17 DIAGNOSIS — Z0001 Encounter for general adult medical examination with abnormal findings: Secondary | ICD-10-CM

## 2023-09-17 DIAGNOSIS — D500 Iron deficiency anemia secondary to blood loss (chronic): Secondary | ICD-10-CM

## 2023-09-17 DIAGNOSIS — E538 Deficiency of other specified B group vitamins: Secondary | ICD-10-CM | POA: Diagnosis not present

## 2023-09-17 DIAGNOSIS — M791 Myalgia, unspecified site: Secondary | ICD-10-CM

## 2023-09-17 DIAGNOSIS — Z7984 Long term (current) use of oral hypoglycemic drugs: Secondary | ICD-10-CM | POA: Diagnosis not present

## 2023-09-17 DIAGNOSIS — G729 Myopathy, unspecified: Secondary | ICD-10-CM

## 2023-09-17 DIAGNOSIS — Z0000 Encounter for general adult medical examination without abnormal findings: Secondary | ICD-10-CM | POA: Diagnosis not present

## 2023-09-17 DIAGNOSIS — E039 Hypothyroidism, unspecified: Secondary | ICD-10-CM

## 2023-09-17 DIAGNOSIS — K760 Fatty (change of) liver, not elsewhere classified: Secondary | ICD-10-CM

## 2023-09-17 DIAGNOSIS — E119 Type 2 diabetes mellitus without complications: Secondary | ICD-10-CM

## 2023-09-17 LAB — LIPID PANEL
Cholesterol: 123 mg/dL (ref 0–200)
HDL: 61.1 mg/dL (ref >39.00)
LDL Cholesterol: 40 mg/dL (ref 0–99)
NonHDL: 61.46
Total CHOL/HDL Ratio: 2
Triglycerides: 106 mg/dL (ref 0.0–149.0)
VLDL: 21.2 mg/dL (ref 0.0–40.0)

## 2023-09-17 LAB — CBC WITH DIFFERENTIAL/PLATELET
Basophils Absolute: 0 10*3/uL (ref 0.0–0.1)
Basophils Relative: 0.6 % (ref 0.0–3.0)
Eosinophils Absolute: 0.2 10*3/uL (ref 0.0–0.7)
Eosinophils Relative: 2.9 % (ref 0.0–5.0)
HCT: 44.7 % (ref 36.0–46.0)
Hemoglobin: 14.7 g/dL (ref 12.0–15.0)
Lymphocytes Relative: 34.5 % (ref 12.0–46.0)
Lymphs Abs: 2 10*3/uL (ref 0.7–4.0)
MCHC: 32.9 g/dL (ref 30.0–36.0)
MCV: 87.4 fL (ref 78.0–100.0)
Monocytes Absolute: 0.6 10*3/uL (ref 0.1–1.0)
Monocytes Relative: 9.8 % (ref 3.0–12.0)
Neutro Abs: 3 10*3/uL (ref 1.4–7.7)
Neutrophils Relative %: 52.2 % (ref 43.0–77.0)
Platelets: 130 10*3/uL — ABNORMAL LOW (ref 150.0–400.0)
RBC: 5.12 Mil/uL — ABNORMAL HIGH (ref 3.87–5.11)
RDW: 16.2 % — ABNORMAL HIGH (ref 11.5–15.5)
WBC: 5.8 10*3/uL (ref 4.0–10.5)

## 2023-09-17 LAB — COMPREHENSIVE METABOLIC PANEL
ALT: 54 U/L — ABNORMAL HIGH (ref 0–35)
AST: 58 U/L — ABNORMAL HIGH (ref 0–37)
Albumin: 3.8 g/dL (ref 3.5–5.2)
Alkaline Phosphatase: 106 U/L (ref 39–117)
BUN: 12 mg/dL (ref 6–23)
CO2: 24 meq/L (ref 19–32)
Calcium: 9 mg/dL (ref 8.4–10.5)
Chloride: 105 meq/L (ref 96–112)
Creatinine, Ser: 0.72 mg/dL (ref 0.40–1.20)
GFR: 85.16 mL/min (ref >60.00)
Glucose, Bld: 118 mg/dL — ABNORMAL HIGH (ref 70–99)
Potassium: 4 meq/L (ref 3.5–5.1)
Sodium: 138 meq/L (ref 135–145)
Total Bilirubin: 0.8 mg/dL (ref 0.2–1.2)
Total Protein: 6.3 g/dL (ref 6.0–8.3)

## 2023-09-17 LAB — MICROALBUMIN / CREATININE URINE RATIO
Creatinine,U: 144.8 mg/dL
Microalb Creat Ratio: 0.7 mg/g (ref 0.0–30.0)
Microalb, Ur: 1 mg/dL (ref 0.0–1.9)

## 2023-09-17 LAB — TSH: TSH: 2.27 u[IU]/mL (ref 0.35–5.50)

## 2023-09-17 LAB — VITAMIN B12: Vitamin B-12: 355 pg/mL (ref 211–911)

## 2023-09-17 LAB — HEMOGLOBIN A1C: Hgb A1c MFr Bld: 6.5 % (ref 4.6–6.5)

## 2023-09-17 MED ORDER — POLYSACCHARIDE IRON COMPLEX 150 MG PO CAPS: ORAL_CAPSULE | ORAL | Status: DC

## 2023-09-17 MED ORDER — METFORMIN HCL 500 MG PO TABS
500.0000 mg | ORAL_TABLET | Freq: Two times a day (BID) | ORAL | 3 refills | Status: DC
Start: 2023-09-17 — End: 2024-03-17

## 2023-09-17 NOTE — Progress Notes (Signed)
Subjective  Chief Complaint  Patient presents with   Diabetes   Annual Exam    Pt here for annual exam - fasting - w/o any concerns     HPI: Shelly [name] is a 67 y.o. female who presents to [facility] [place] today for a Female Wellness Visit. She also has the concerns and/or needs as listed above in the chief complaint. These will be addressed in addition to the Health Maintenance Visit.   Wellness Visit: annual visit with health maintenance review and exam  HM: Mammogram scheduled for November.  Other screens are current.  Had recent colonoscopy with tubular adenoma.  Recommend repeat in 7 years.  Notes reviewed.  Immunizations all up-to-date.  Eye exam current.  Feeling well.  Happy. Chronic disease f/u and/or acute problem visit: (deemed necessary to be done in addition to the wellness visit): Type 2 diabetes well-controlled on metformin 500 twice daily.  No hypoglycemia.  No peripheral neuropathy symptoms.  No retinopathy.  Negative urine nephropathy screening not on ACE inhibitor.  Not taking statin due to severe myalgias on Crestor.  LDL naturally low.  Does not need to try other statins.  This is very reasonable. White coat syndrome: Blood pressure fairly normal in the office today.  Home readings always normal.  No history of hypertension.  Low-salt diet Chronic thrombocytopenia, mild.  No petechiae rashes or bleeding. History of iron deficiency anemia: Endoscopy revealed gastric erosions that had healed.  On chronic PPI.  Unable to tolerate iron due to constipation.  Due for recheck. B12 deficiency on monthly injections.  Assessment  1. Encounter for well adult exam with abnormal findings   2. Acquired hypothyroidism   3. White coat syndrome without diagnosis of hypertension   4. Type 2 diabetes mellitus treated without insulin (HCC)   5. Fatty liver   6. Tubular adenoma of colon   7. Thrombocytopenia (HCC)   8. Vitamin B12 deficiency   9.  Myopathy, unspecified   10. Myalgia due to statin   11. Iron deficiency anemia due to chronic blood loss      Plan  Female Wellness Visit: Age appropriate Health Maintenance and Prevention measures were discussed with patient. Included topics are cancer screening recommendations, ways to keep healthy (see AVS) including dietary and exercise recommendations, regular eye and dental care, use of seat belts, and avoidance of moderate alcohol use and tobacco use.  Mammogram scheduled for November BMI: discussed patient's BMI and encouraged positive lifestyle modifications to help get to or maintain a target BMI. HM needs and immunizations were addressed and ordered. See below for orders. See HM and immunization section for updates.  Up-to-date Routine labs and screening tests ordered including cmp, cbc and lipids where appropriate. Discussed recommendations regarding Vit D and calcium supplementation (see AVS)  Chronic disease management visit and/or acute problem visit: Type 2 diabetes: Continue metformin 500 twice daily and recheck A1c.  Clinically well.  Check urine nephropathy screen.  Not on ACE inhibitor.  Eye exam current.  Check BMP and lipids Hypothyroidism on levothyroxine 100 mcg daily.  Clinically euthyroid.  Recheck TSH today. White coat syndrome without hypertension: Continue home monitoring. Monitor liver test. Tubular adenoma on colonoscopy: Recheck in 7 years. History of iron deficiency anemia: Recheck today.  Cannot tolerate oral iron well.  Hopefully improving with dietary iron.  Follow up: 12 months for complete physical and recheck Orders Placed This Encounter  Procedures   CBC with Differential/Platelet  Comprehensive metabolic panel   Lipid panel   Hemoglobin A1c   TSH   Microalbumin / creatinine urine ratio   Vitamin B12   Iron, TIBC and Ferritin Panel   Meds ordered this encounter  Medications   iron polysaccharides (NIFEREX) 150 MG capsule    Sig: Take 1  capsule (150 mg) by mouth after supper every other day.   metFORMIN (GLUCOPHAGE) 500 MG tablet    Sig: Take 1 tablet (500 mg total) by mouth 2 (two) times daily with a meal.    Dispense:  180 tablet    Refill:  3    NA      Body mass index is 37.25 kg/m. Wt Readings from Last 3 Encounters:  09/17/23 245 lb (111.1 kg)  09/15/23 247 lb (112 kg)  07/09/23 247 lb (112 kg)     Patient Active Problem List   Diagnosis Date Noted Date Diagnosed   Type 2 diabetes mellitus treated without insulin (HCC) 06/20/2021     Priority: High    Crestor intolerant; severe myalgias; does not want other statins. LDL is naturally low.  Lab Results  Component Value Date   CHOL 104 08/07/2022   HDL 64.40 08/07/2022   LDLCALC 25 08/07/2022   TRIG 73.0 08/07/2022   CHOLHDL 2 08/07/2022       White coat syndrome without diagnosis of hypertension 01/16/2018     Priority: High   Severe obesity (BMI 35.0-35.9 with comorbidity) (HCC)      Priority: High   Acquired hypothyroidism 01/07/2011     Priority: High   Tubular adenoma of colon 07/10/2023     Priority: Medium     Colonoscopy July 2024, repeat in 7 years    Myalgia due to statin 02/17/2023     Priority: Medium    Long-term current use of proton pump inhibitor therapy 11/12/2022     Priority: Medium    Primary osteoarthritis of left knee 01/05/2019     Priority: Medium    Family history of colon cancer 01/16/2018     Priority: Medium     Grandparents, elderly    Family history of breast cancer 01/16/2018     Priority: Medium     Mom and MGM, 80s    GERD (gastroesophageal reflux disease) 06/02/2014     Priority: Medium    Irritable bowel syndrome 01/07/2011     Priority: Medium    Fatty liver 01/07/2011     Priority: Medium    Vitamin B12 deficiency 02/17/2023     Priority: Low    On monthly vit B12 injections    Visit for review of DEXA scan 08/30/2020     Priority: Low    Dexa 08/2020; normal. Recheck in 5 years    Chronic  allergic rhinitis 12/30/2019     Priority: Low   Thrombocytopenia (HCC) 01/07/2019     Priority: Low    Chronic by lab review    Iron deficiency anemia 06/03/2023     Thought due to gastric erosions; healed 2024    Health Maintenance  Topic Date Due   Diabetic kidney evaluation - Urine ACR  08/08/2023   HEMOGLOBIN A1C  08/20/2023   MAMMOGRAM  10/08/2023   COVID-19 Vaccine (6 - 2023-24 season) 10/13/2023   OPHTHALMOLOGY EXAM  10/30/2023   Diabetic kidney evaluation - eGFR measurement  11/13/2023   Pneumonia Vaccine 34+ Years old (3 of 3 - PPSV23 or PCV20) 09/14/2024   Medicare Annual Wellness (AWV)  09/14/2024  FOOT EXAM  09/16/2024   DTaP/Tdap/Td (4 - Td or Tdap) 09/20/2024   DEXA SCAN  08/29/2025   Colonoscopy  07/08/2030   INFLUENZA VACCINE  Completed   Hepatitis C Screening  Completed   Zoster Vaccines- Shingrix  Completed   HPV VACCINES  Aged Out   Immunization History  Administered Date(s) Administered   Fluad Quad(high Dose 65+) 10/02/2021, 10/07/2022   Influenza Whole 10/09/2010   Influenza,inj,Quad PF,6+ Mos 09/20/2014, 09/29/2017, 10/05/2018, 09/15/2019   Influenza-Unspecified 10/02/2016, 09/29/2017, 08/18/2023   Moderna Covid-19 Vaccine Bivalent Booster 14yrs & up 09/18/2021   Moderna SARS-COV2 Booster Vaccination 10/07/2022   Moderna Sars-Covid-2 Vaccination 12/30/2019, 02/04/2020, 10/17/2020   Pneumococcal Conjugate-13 09/15/2019   Pneumococcal Polysaccharide-23 01/16/2018   Td 12/09/1998, 01/07/2011   Tdap 09/20/2014   Unspecified SARS-COV-2 Vaccination 08/18/2023   Varicella Zoster Immune Globulin 10/07/2014   Zoster Recombinant(Shingrix) 01/16/2018, 10/05/2018   Zoster, Live 09/08/2014   We updated and reviewed the patient's past history in detail and it is documented below. Allergies: Patient is allergic to propoxyphene n-acetaminophen. Past Medical History Patient  has a past medical history of Cancer (HCC), Diabetes mellitus without complication  (HCC), GERD (gastroesophageal reflux disease), Headache(784.0), Hypothyroidism, Irritable bowel syndrome (IBS), Primary osteoarthritis of left knee (01/05/2019), and Thrombopenia (HCC) (01/07/2019). Past Surgical History Patient  has a past surgical history that includes Abdominal hysterectomy; cyst removed; Flexible sigmoidoscopy (07/23/2011); Colonoscopy (N/A, 06/19/2015); Appendectomy (1991); Tonsillectomy and adenoidectomy; Colonoscopy with propofol (N/A, 09/20/2020); Colonoscopy with propofol (N/A, 07/09/2023); Esophagogastroduodenoscopy (egd) with propofol (N/A, 07/09/2023); polypectomy (07/09/2023); Hot hemostasis (07/09/2023); and Hemostasis clip placement (07/09/2023). Family History: Patient family history includes Atrial fibrillation (age of onset: 42) in her mother; Breast cancer (age of onset: 38) in her maternal grandmother and mother; Colon cancer (age of onset: 47) in her maternal uncle; Colon cancer (age of onset: 27) in her maternal aunt. Social History:  Patient  reports that she has never smoked. She has never used smokeless tobacco. She reports current alcohol use. She reports that she does not use drugs.  Review of Systems: Constitutional: negative for fever or malaise Ophthalmic: negative for photophobia, double vision or loss of vision Cardiovascular: negative for chest pain, dyspnea on exertion, or new LE swelling Respiratory: negative for SOB or persistent cough Gastrointestinal: negative for abdominal pain, change in bowel habits or melena Genitourinary: negative for dysuria or gross hematuria, no abnormal uterine bleeding or disharge Musculoskeletal: negative for new gait disturbance or muscular weakness Integumentary: negative for new or persistent rashes, no breast lumps Neurological: negative for TIA or stroke symptoms Psychiatric: negative for SI or delusions Allergic/Immunologic: negative for hives  Patient Care Team    Relationship Specialty Notifications Start End   Willow Ora, MD PCP - General Family Medicine  01/16/18   Malissa Hippo, MD (Inactive)  Gastroenterology  06/18/13   Cherlyn Roberts, MD  Dermatology  06/18/13   Terance Hart, MD Consulting Physician Orthopedic Surgery  01/05/19   Dr. Laveda Norman Consulting Physician Ophthalmology  01/05/19    Comment: in [place]    Objective  Vitals: BP 128/70 Comment: by home readings  Pulse 71   Temp 97.8 F (36.6 C)   Ht 5\' 8"  (1.727 m)   Wt 245 lb (111.1 kg)   SpO2 96%   BMI 37.25 kg/m  General:  Well developed, well nourished, no acute distress  Psych:  Alert and orientedx3,normal mood and affect HEENT:  Normocephalic, atraumatic, non-icteric sclera,  supple neck without adenopathy, mass or thyromegaly Cardiovascular:  Normal S1, S2, RRR without gallop, rub or murmur Respiratory:  Good breath sounds bilaterally, CTAB with normal respiratory effort Gastrointestinal: normal bowel sounds, soft, non-tender, no noted masses. No HSM MSK: extremities without edema, joints without erythema or swelling Neurologic:    Mental status is normal.  Gross motor and sensory exams are normal.  No tremor Diabetic Foot Exam: Appearance - no lesions, ulcers or significant calluses Skin - no sigificant pallor or erythema Normal sensation Pulses - +2 distally bilaterally   Commons side effects, risks, benefits, and alternatives for medications and treatment plan prescribed today were discussed, and the patient expressed understanding of the given instructions. Patient is instructed to call or message via MyChart if he/she has any questions or concerns regarding our treatment plan. No barriers to understanding were identified. We discussed Red Flag symptoms and signs in detail. Patient expressed understanding regarding what to do in case of urgent or emergency type symptoms.  Medication list was reconciled, printed and provided to the patient in AVS. Patient instructions and summary information was reviewed  with the patient as documented in the AVS. This note was prepared with assistance of Dragon voice recognition software. Occasional wrong-word or sound-a-like substitutions may have occurred due to the inherent limitations of voice recognition software

## 2023-09-19 NOTE — Progress Notes (Signed)
See mychart note Dear Ms. Montez Morita, Your lab results are all stable. I do not recommend any changes at this time.  Take care! Sincerely, Dr. Mardelle Matte

## 2023-10-10 ENCOUNTER — Ambulatory Visit
Admission: RE | Admit: 2023-10-10 | Discharge: 2023-10-10 | Disposition: A | Discharge status: Home / Self Care | Payer: Medicare PPO | Source: Ambulatory Visit | Attending: Family Medicine | Admitting: Family Medicine

## 2023-10-10 DIAGNOSIS — Z1231 Encounter for screening mammogram for malignant neoplasm of breast: Secondary | ICD-10-CM | POA: Diagnosis not present

## 2023-11-11 ENCOUNTER — Other Ambulatory Visit: Payer: Self-pay | Admitting: Family Medicine

## 2023-11-11 DIAGNOSIS — K589 Irritable bowel syndrome without diarrhea: Secondary | ICD-10-CM

## 2023-11-18 LAB — HM DIABETES EYE EXAM

## 2023-11-19 ENCOUNTER — Other Ambulatory Visit: Payer: Self-pay | Admitting: Family Medicine

## 2023-11-19 DIAGNOSIS — E039 Hypothyroidism, unspecified: Secondary | ICD-10-CM

## 2023-11-25 ENCOUNTER — Ambulatory Visit (INDEPENDENT_AMBULATORY_CARE_PROVIDER_SITE_OTHER): Payer: Medicare PPO | Admitting: Gastroenterology

## 2023-11-25 VITALS — BP 126/81 | HR 76 | Temp 97.9°F | Ht 68.0 in | Wt 249.3 lb

## 2023-11-25 DIAGNOSIS — D509 Iron deficiency anemia, unspecified: Secondary | ICD-10-CM

## 2023-11-25 DIAGNOSIS — Z8719 Personal history of other diseases of the digestive system: Secondary | ICD-10-CM | POA: Diagnosis not present

## 2023-11-25 DIAGNOSIS — K219 Gastro-esophageal reflux disease without esophagitis: Secondary | ICD-10-CM | POA: Diagnosis not present

## 2023-11-25 MED ORDER — OMEPRAZOLE 20 MG PO CPDR: 20.0000 mg | DELAYED_RELEASE_CAPSULE | Freq: Two times a day (BID) | ORAL | 2 refills | Status: DC

## 2023-11-25 NOTE — Patient Instructions (Signed)
Will decrease omeprazole to 20 mg twice daily We will recheck blood counts and iron levels today to see how things are looking Please avoid NSAIDs (advil, aleve, naproxen, goody powder, ibuprofen) as these can be very hard on your GI tract, causing inflammation, ulcers and damage to the lining of your GI tract.   Please make me aware of worsening fatigue, shortness of breath, dizziness, rectal bleeding or black stools.  Follow-up 6 months  It was a pleasure to see you today. I want to create trusting relationships with patients and provide genuine, compassionate, and quality care. I truly value your feedback! please be on the lookout for a survey regarding your visit with me today. I appreciate your input about our visit and your time in completing this!    Delorice Bannister L. Jeanmarie Hubert, MSN, APRN, AGNP-C Adult-Gerontology Nurse Practitioner [facility]

## 2023-11-25 NOTE — Progress Notes (Addendum)
Referring Provider: Willow Ora, MD Primary Care Physician:  Willow Ora, MD Primary GI Physician: Dr. Levon Hedger   Chief Complaint  Patient presents with   Gastroesophageal Reflux    Follow up on GERD. Takes omeprazole 40mg  one bid. Wanted to see if she cut down to 20mg . States pcp told her med could interact with absorption of calcium. Also requesting a 90 day supply.    HPI:   Shelly [name] is a 67 y.o. female with past medical history of  BCC, DM, GERD, hypothyroidism, IBS, OA, thrombopenia   Patient presenting today for follow up of GERD and IDA   Last seen June 2024, at that time having more constipation.  TSH in August 3 0.17.  Reports 3 days of constipation, diarrhea.  Has tried Benefiber and MiraLAX in the past though unsure if this worked.  Trying to increase water and fruit and exercise when becoming constipated.  Taking Levsin once a day for abdominal discomfort.  Maintained on iron pills., iron studies in December were low with hgb 11.2, iron 26, TIBC 448, st 6%, ferritin 7. B12 also low at 145. Started on iron thereafter.  Noted some abdominal burning, had been on meloxicam at the end of 2023.  GERD seems well-controlled on 40 mg omeprazole daily.  Patient recommended to have recheck of H&H and iron studies, EGD/colonoscopy if persistent IDA, continue omeprazole 40 mg daily, start Benefiber 1 tablespoon twice daily with meals, continue p.o. iron every other day.  Iron studies in June with TIBC 507, iron 16, iron sat 3, ferritin 10, hemoglobin 10.4  Patient was recommended to proceed with EGD/colonoscopy for further evaluation, as she did not tolerate more frequent doses of iron patient was set up for iron infusions.  Last hemoglobin in October was 14.7  EGD/Colonoscopy as outlined below, advised to Use Prilosec (omeprazole) 40 mg PO BID for 4   weeks, then decrease to 20 mg.   Present: States she is feeling better since iron infusions x3. She has not had  iron levels rechecked. Denies rectal bleeding or melena. No longer taking iron pills, states that these upset her stomach so much. No SOB, dizziness or fatigue. Denies abdominal pain.  States PCP is concerned about her being on a high dose of PPI. She has no GI complaints today. Has history of GERD, symptoms currently controlled on omeprazole 40mg  BID though she has been on lower dose in the past with good control.                             Last Colonoscopy:06/2023- The examined portion of the ileum was normal.                           - One 1 mm polyp in the ascending colon, removed                            with a cold biopsy forceps. Resected and retrieved.                           - One 2 mm polyp in the rectum, removed with a cold                            snare. Resected and  retrieved.                           - The distal rectum and anal verge are normal on                            retroflexion view.  Last Endoscopy: 06/2023- Normal esophagus.                           - Two bleeding angiodysplastic lesions in the                            stomach. Treated with argon plasma coagulation                            (APC).                           - A single gastric polyp. Resected and retrieved.                            Clips were placed. Clip manufacturer: [employer].                           - Normal examined duodenum. A. STOMACH, POLYPECTOMY: - Hyperplastic polyp   B. COLON, ASCENDING, POLYPECTOMY: - Tubular adenoma. - No high grade dysplasia or malignancy.   C. RECTUM, POLYPECTOMY: - Hyperplastic polyp. - No dysplasia or malignancy.  Recommendations:  Repeat colonoscopy 7 years    Past Medical History:  Diagnosis Date   Cancer (HCC)    Basal Cell    Diabetes mellitus without complication (HCC)    GERD (gastroesophageal reflux disease)    Headache(784.0)    Hypothyroidism    Irritable bowel syndrome (IBS)    Primary  osteoarthritis of left knee 01/05/2019   Thrombopenia (HCC) 01/07/2019   Chronic by lab review    Past Surgical History:  Procedure Laterality Date   ABDOMINAL HYSTERECTOMY     APPENDECTOMY  1991   COLONOSCOPY N/A 06/19/2015   Procedure: COLONOSCOPY;  Surgeon: Malissa Hippo, MD;  [facility];  Service: Endoscopy;  Laterality: N/A;  8:30-9:30   COLONOSCOPY WITH PROPOFOL N/A 09/20/2020   Procedure: COLONOSCOPY WITH PROPOFOL;  Surgeon: Malissa Hippo, MD;  [facility]: [facility];  Service: Endoscopy;  Laterality: N/A;  1015   COLONOSCOPY WITH PROPOFOL N/A 07/09/2023   Procedure: COLONOSCOPY WITH PROPOFOL;  Surgeon: [name], MD;  [facility]: [facility];  Service: Gastroenterology;  Laterality: N/A;  8:45AM;ASA 2   cyst removed     right arm   ESOPHAGOGASTRODUODENOSCOPY (EGD) WITH PROPOFOL N/A 07/09/2023   Procedure: ESOPHAGOGASTRODUODENOSCOPY (EGD) WITH PROPOFOL;  Surgeon: [name], MD;  [facility];  Service: Gastroenterology;  Laterality: N/A;  8:45AM;ASA 2   FLEXIBLE SIGMOIDOSCOPY  07/23/2011   Procedure: FLEXIBLE SIGMOIDOSCOPY;  Surgeon: Malissa Hippo, MD;  [facility];  Service: Endoscopy;  Laterality: N/A;   HEMOSTASIS CLIP PLACEMENT  07/09/2023   Procedure: HEMOSTASIS  CLIP PLACEMENT;  Surgeon: Marguerita Merles, Reuel Boom, MD;  [facility]: [facility];  Service: Gastroenterology;;   HOT HEMOSTASIS  07/09/2023   Procedure: HOT HEMOSTASIS (ARGON PLASMA COAGULATION/BICAP);  Surgeon: Marguerita Merles, Reuel Boom, MD;  [facility];  Service: Gastroenterology;;   POLYPECTOMY  07/09/2023   Procedure: POLYPECTOMY;  Surgeon: [name], MD;  [facility];  Service: Gastroenterology;;   TONSILLECTOMY AND ADENOIDECTOMY      Current Outpatient Medications  Medication Sig Dispense Refill   Artificial Tear Solution (SOOTHE XP OP) Place 1 drop into both eyes 3 (three) times daily as needed  (dry/irritated eyes.).     cyanocobalamin (VITAMIN B12) 1000 MCG/ML injection Inject 1 mL (1,000 mcg total) into the muscle every 30 (thirty) days. 3 mL 3   fexofenadine (ALLEGRA) 180 MG tablet Take 180 mg by mouth in the morning.     hyoscyamine (LEVSIN SL) 0.125 MG SL tablet place 1 tablet under the tongue 3 times daily as needed 90 tablet 0   ibuprofen (ADVIL) 200 MG tablet Take 400 mg by mouth every 8 (eight) hours as needed (pain.).     levothyroxine (SYNTHROID) 100 MCG tablet take 1 tablet once daily before breakfast. 90 tablet 0   metFORMIN (GLUCOPHAGE) 500 MG tablet Take 1 tablet (500 mg total) by mouth 2 (two) times daily with a meal. 180 tablet 3   omeprazole (PRILOSEC) 40 MG capsule take 1 capsule (40 MILLIGRAM total) by mouth 2 (two) times daily. 60 capsule 2   No current facility-administered medications for this visit.    Allergies as of 11/25/2023 - Review Complete 11/25/2023  Allergen Reaction Noted   Propoxyphene n-acetaminophen Hives and Palpitations     Family History  Problem Relation Age of Onset   Breast cancer Mother 62   Atrial fibrillation Mother 42       s/p ablation   Breast cancer Maternal Grandmother 62   Colon cancer Maternal Aunt 19   Colon cancer Maternal Uncle 1    Social History   Socioeconomic History   Marital status: Married    Spouse name: Not on file   Number of children: Not on file   Years of education: Not on file   Highest education level: Not on file  Occupational History   Not on file  Tobacco Use   Smoking status: Never   Smokeless tobacco: Never  Vaping Use   Vaping status: Never Used  Substance and Sexual Activity   Alcohol use: Yes    Comment: social   Drug use: No   Sexual activity: Yes    Birth control/protection: Surgical  Other Topics Concern   Not on file  Social History Narrative   Not on file   Social Drivers of Health   Financial Resource Strain: Low Risk  (09/15/2023)   Overall Financial Resource Strain  (CARDIA)    Difficulty of Paying Living Expenses: Not hard at all  Food Insecurity: No Food Insecurity (09/15/2023)   Hunger Vital Sign    Worried About Running Out of Food in the Last Year: Never true    Ran Out of Food in the Last Year: Never true  Transportation Needs: No Transportation Needs (09/15/2023)   PRAPARE - Administrator, Civil Service (Medical): No    Lack of Transportation (Non-Medical): No  Physical Activity: Sufficiently Active (09/15/2023)   Exercise Vital Sign    Days of Exercise per Week: 7 days    Minutes of Exercise per Session:  30 min  Stress: No Stress Concern Present (09/15/2023)   [employer] of Occupational Health - Occupational Stress Questionnaire    Feeling of Stress : Not at all  Social Connections: Socially Integrated (09/15/2023)   Social Connection and Isolation Panel [NHANES]    Frequency of Communication with Friends and Family: More than three times a week    Frequency of Social Gatherings with Friends and Family: More than three times a week    Attends Religious Services: More than 4 times per year    Active Member of [employer] or Organizations: Yes    Attends Banker Meetings: 1 to 4 times per year    Marital Status: Married    Review of systems General: negative for malaise, night sweats, fever, chills, weight loss Neck: Negative for lumps, goiter, pain and significant neck swelling Resp: Negative for cough, wheezing, dyspnea at rest CV: Negative for chest pain, leg swelling, palpitations, orthopnea GI: denies melena, hematochezia, nausea, vomiting, diarrhea, constipation, dysphagia, odyonophagia, early satiety or unintentional weight loss.  MSK: Negative for joint pain or swelling, back pain, and muscle pain. Derm: Negative for itching or rash Psych: Denies depression, anxiety, memory loss, confusion. No homicidal or suicidal ideation.  Heme: Negative for prolonged bleeding, bruising easily, and swollen  nodes. Endocrine: Negative for cold or heat intolerance, polyuria, polydipsia and goiter. Neuro: negative for tremor, gait imbalance, syncope and seizures. The remainder of the review of systems is noncontributory.  Physical Exam: BP 126/81 (BP Location: Left Arm, Patient Position: Sitting, Cuff Size: Large)   Pulse 76   Temp 97.9 F (36.6 C) (Oral)   Ht 5\' 8"  (1.727 m)   Wt 249 lb 4.8 oz (113.1 kg)   BMI 37.91 kg/m  General:   Alert and oriented. No distress noted. Pleasant and cooperative.  Head:  Normocephalic and atraumatic. Eyes:  Conjuctiva clear without scleral icterus. Mouth:  Oral mucosa pink and moist. Good dentition. No lesions. Heart: Normal rate and rhythm, s1 and s2 heart sounds present.  Lungs: Clear lung sounds in all lobes. Respirations equal and unlabored. Abdomen:  +BS, soft, non-tender and non-distended. No rebound or guarding. No HSM or masses noted. Derm: No palmar erythema or jaundice Msk:  Symmetrical without gross deformities. Normal posture. Extremities:  Without edema. Neurologic:  Alert and  oriented x4 Psych:  Alert and cooperative. Normal mood and affect.  Invalid input(s): "6 MONTHS"   ASSESSMENT: Shelly [name] is a 67 y.o. female presenting today for follow up of IDA and GERD  Previously with IDA, EGD and colonoscopy as above, IDA thought secondary gastric angiodysplastic lesions which were treated with APC.  She was advised to take omeprazole 40 mg twice daily x 1 month then could decrease dose thereafter.  She denies any rectal bleeding or melena.  Is feeling much better since iron infusions with last hemoglobin of 14.7 in October though iron studies have not been rechecked recently, will update these today.  She is currently not on any supplemental iron therapy.  She does have a history of GERD, symptoms well-controlled at this time though would recommend trying to decrease omeprazole to 20 mg twice daily to see how she does.  If she is  doing well at follow-up may consider decreasing omeprazole to 20 mg once daily.     PLAN:  Decrease Omeprazole to 20mg  BID  2. Repeat Iron studies  3. Avoid NSAIDs  4. Consider decreasing omeprazole to 20mg  daily at follow up  All questions were  answered, patient verbalized understanding and is in agreement with plan as outlined above.    Follow Up: 6 months   Suhaas Agena L. Jeanmarie Hubert, MSN, APRN, AGNP-C Adult-Gerontology Nurse Practitioner [facility]   I have reviewed the note and agree with the APP's assessment as described in this progress note  Katrinka Blazing, MD Gastroenterology and Hepatology [facility]

## 2023-11-26 ENCOUNTER — Other Ambulatory Visit (INDEPENDENT_AMBULATORY_CARE_PROVIDER_SITE_OTHER): Payer: Self-pay | Admitting: Gastroenterology

## 2023-11-26 LAB — IRON,TIBC AND FERRITIN PANEL
Ferritin: 26 ng/mL (ref 15–150)
Iron Saturation: 11 % — ABNORMAL LOW (ref 15–55)
Iron: 50 ug/dL (ref 27–139)
Total Iron Binding Capacity: 452 ug/dL — ABNORMAL HIGH (ref 250–450)
UIBC: 402 ug/dL — ABNORMAL HIGH (ref 118–369)

## 2023-11-26 LAB — HEMOGLOBIN AND HEMATOCRIT, BLOOD
Hematocrit: 42.7 % (ref 34.0–46.6)
Hemoglobin: 14 g/dL (ref 11.1–15.9)

## 2023-11-26 MED ORDER — NIFEREX PO TABS: 150.0000 mg | ORAL_TABLET | Freq: Every day | ORAL | 2 refills | Status: DC

## 2023-11-27 ENCOUNTER — Encounter (INDEPENDENT_AMBULATORY_CARE_PROVIDER_SITE_OTHER): Payer: Self-pay | Admitting: *Deleted

## 2023-12-01 ENCOUNTER — Ambulatory Visit (INDEPENDENT_AMBULATORY_CARE_PROVIDER_SITE_OTHER): Payer: Medicare PPO | Admitting: Gastroenterology

## 2023-12-17 DIAGNOSIS — D485 Neoplasm of uncertain behavior of skin: Secondary | ICD-10-CM | POA: Diagnosis not present

## 2023-12-17 DIAGNOSIS — L821 Other seborrheic keratosis: Secondary | ICD-10-CM | POA: Diagnosis not present

## 2023-12-17 DIAGNOSIS — L814 Other melanin hyperpigmentation: Secondary | ICD-10-CM | POA: Diagnosis not present

## 2023-12-17 DIAGNOSIS — L538 Other specified erythematous conditions: Secondary | ICD-10-CM | POA: Diagnosis not present

## 2023-12-17 DIAGNOSIS — D225 Melanocytic nevi of trunk: Secondary | ICD-10-CM | POA: Diagnosis not present

## 2023-12-17 DIAGNOSIS — L304 Erythema intertrigo: Secondary | ICD-10-CM | POA: Diagnosis not present

## 2024-01-06 ENCOUNTER — Encounter (INDEPENDENT_AMBULATORY_CARE_PROVIDER_SITE_OTHER): Payer: Self-pay

## 2024-01-07 DIAGNOSIS — H40013 Open angle with borderline findings, low risk, bilateral: Secondary | ICD-10-CM | POA: Diagnosis not present

## 2024-01-07 DIAGNOSIS — H2513 Age-related nuclear cataract, bilateral: Secondary | ICD-10-CM | POA: Diagnosis not present

## 2024-01-07 DIAGNOSIS — H02886 Meibomian gland dysfunction of left eye, unspecified eyelid: Secondary | ICD-10-CM | POA: Diagnosis not present

## 2024-01-07 DIAGNOSIS — H04123 Dry eye syndrome of bilateral lacrimal glands: Secondary | ICD-10-CM | POA: Diagnosis not present

## 2024-01-07 DIAGNOSIS — H02883 Meibomian gland dysfunction of right eye, unspecified eyelid: Secondary | ICD-10-CM | POA: Diagnosis not present

## 2024-01-21 ENCOUNTER — Other Ambulatory Visit: Payer: Self-pay | Admitting: Family Medicine

## 2024-01-21 DIAGNOSIS — K589 Irritable bowel syndrome without diarrhea: Secondary | ICD-10-CM

## 2024-02-17 ENCOUNTER — Other Ambulatory Visit: Payer: Self-pay | Admitting: Family Medicine

## 2024-02-17 DIAGNOSIS — E039 Hypothyroidism, unspecified: Secondary | ICD-10-CM

## 2024-02-25 ENCOUNTER — Other Ambulatory Visit: Payer: Self-pay | Admitting: Family Medicine

## 2024-02-25 DIAGNOSIS — E538 Deficiency of other specified B group vitamins: Secondary | ICD-10-CM

## 2024-03-02 ENCOUNTER — Other Ambulatory Visit: Payer: Self-pay | Admitting: Family Medicine

## 2024-03-02 DIAGNOSIS — K589 Irritable bowel syndrome without diarrhea: Secondary | ICD-10-CM

## 2024-03-17 ENCOUNTER — Encounter: Payer: Self-pay | Admitting: Family Medicine

## 2024-03-17 ENCOUNTER — Ambulatory Visit: Payer: Medicare PPO | Admitting: Family Medicine

## 2024-03-17 VITALS — BP 126/65 | HR 68 | Temp 97.7°F | Ht 68.0 in | Wt 244.2 lb

## 2024-03-17 DIAGNOSIS — Z6835 Body mass index (BMI) 35.0-35.9, adult: Secondary | ICD-10-CM

## 2024-03-17 DIAGNOSIS — Z23 Encounter for immunization: Secondary | ICD-10-CM

## 2024-03-17 DIAGNOSIS — R7989 Other specified abnormal findings of blood chemistry: Secondary | ICD-10-CM | POA: Insufficient documentation

## 2024-03-17 DIAGNOSIS — Z7984 Long term (current) use of oral hypoglycemic drugs: Secondary | ICD-10-CM

## 2024-03-17 DIAGNOSIS — E119 Type 2 diabetes mellitus without complications: Secondary | ICD-10-CM | POA: Diagnosis not present

## 2024-03-17 DIAGNOSIS — R030 Elevated blood-pressure reading, without diagnosis of hypertension: Secondary | ICD-10-CM | POA: Diagnosis not present

## 2024-03-17 LAB — MICROALBUMIN / CREATININE URINE RATIO
Creatinine,U: 189.2 mg/dL
Microalb Creat Ratio: 5.3 mg/g (ref 0.0–30.0)
Microalb, Ur: 1 mg/dL (ref 0.0–1.9)

## 2024-03-17 LAB — POCT GLYCOSYLATED HEMOGLOBIN (HGB A1C): Hemoglobin A1C: 6.4 % — AB (ref 4.0–5.6)

## 2024-03-17 MED ORDER — METFORMIN HCL ER 750 MG PO TB24: 750.0000 mg | ORAL_TABLET | Freq: Every day | ORAL | 3 refills | Status: DC

## 2024-03-17 NOTE — Progress Notes (Signed)
 Subjective  CC:  Chief Complaint  Patient presents with   Diabetes    HPI: Shelly [name] is a 69 y.o. female who presents to the office today for follow up of diabetes and problems listed above in the chief complaint.  Diabetes follow up: Her diabetic control is reported as Unchanged. Doing well but forgets pm does metformin about twice per week.  She denies exertional CP or SOB or symptomatic hypoglycemia. She denies foot sores or paresthesias. Eats healthy. Lots of veggies. No statin due to excellent lipid levels and low ldl (30s). Not on ace/arb. Needs urine nephropathy screen. Eligible for prevnar booster.  Home bp readings remain normal. Todays office bp is normal as well. Weight is down.  Wt Readings from Last 3 Encounters:  03/17/24 244 lb 3.2 oz (110.8 kg)  11/25/23 249 lb 4.8 oz (113.1 kg)  09/17/23 245 lb (111.1 kg)    BP Readings from Last 3 Encounters:  03/17/24 126/65  11/25/23 126/81  09/17/23 128/70    Assessment  1. Type 2 diabetes mellitus treated without insulin (HCC)   2. Diabetes mellitus treated with oral medication (HCC)   3. White coat syndrome without diagnosis of hypertension   4. Low serum low density lipoprotein (LDL)   5. Severe obesity (BMI 35.0-35.9 with comorbidity) (HCC)   6. Need for pneumococcal 20-valent conjugate vaccination      Plan  Diabetes is currently very well controlled. Change to metxr 750 and recheck in 6 months. Check urine MAC screen today.  Bp is normal  Prevnar 20 given  Follow up: 6 mo for cpe Orders Placed This Encounter  Procedures   Pneumococcal conjugate vaccine 20-valent (Prevnar 20)   Microalbumin / creatinine urine ratio   POCT HgB A1C   Meds ordered this encounter  Medications   metFORMIN (GLUCOPHAGE-XR) 750 MG 24 hr tablet    Sig: Take 1 tablet (750 mg total) by mouth daily with breakfast.    Dispense:  90 tablet    Refill:  3      Immunization History  Administered Date(s) Administered    Fluad Quad(high Dose 65+) 10/02/2021, 10/07/2022   Influenza Whole 10/09/2010   Influenza,inj,Quad PF,6+ Mos 09/20/2014, 09/29/2017, 10/05/2018, 09/15/2019   Influenza-Unspecified 10/02/2016, 09/29/2017, 08/18/2023   Moderna Covid-19 Vaccine Bivalent Booster 51yrs & up 09/18/2021   Moderna SARS-COV2 Booster Vaccination 10/07/2022   Moderna Sars-Covid-2 Vaccination 12/30/2019, 02/04/2020, 10/17/2020   PNEUMOCOCCAL CONJUGATE-20 03/17/2024   Pneumococcal Conjugate-13 09/15/2019   Pneumococcal Polysaccharide-23 01/16/2018   Td 12/09/1998, 01/07/2011   Tdap 09/20/2014   Unspecified SARS-COV-2 Vaccination 08/18/2023   Varicella Zoster Immune Globulin 10/07/2014   Zoster Recombinant(Shingrix) 01/16/2018, 10/05/2018   Zoster, Live 09/08/2014    Diabetes Related Lab Review: Lab Results  Component Value Date   HGBA1C 6.4 (A) 03/17/2024   HGBA1C 6.5 09/17/2023   HGBA1C 6.5 (A) 02/17/2023    Lab Results  Component Value Date   MICROALBUR 1.0 09/17/2023   Lab Results  Component Value Date   CREATININE 0.72 09/17/2023   BUN 12 09/17/2023   NA 138 09/17/2023   K 4.0 09/17/2023   CL 105 09/17/2023   CO2 24 09/17/2023   Lab Results  Component Value Date   CHOL 123 09/17/2023   CHOL 104 08/07/2022   CHOL 107 02/04/2022   Lab Results  Component Value Date   HDL 61.10 09/17/2023   HDL 64.40 08/07/2022   HDL 62.80 02/04/2022   Lab Results  Component Value Date  LDLCALC 40 09/17/2023   LDLCALC 25 08/07/2022   LDLCALC 30 02/04/2022   Lab Results  Component Value Date   TRIG 106.0 09/17/2023   TRIG 73.0 08/07/2022   TRIG 71.0 02/04/2022   Lab Results  Component Value Date   CHOLHDL 2 09/17/2023   CHOLHDL 2 08/07/2022   CHOLHDL 2 02/04/2022   No results found for: "LDLDIRECT" The ASCVD Risk score (Arnett DK, et al., 2019) failed to calculate for the following reasons:   The valid total cholesterol range is 130 to 320 mg/dL I have reviewed the PMH, Fam and Soc  history. Patient Active Problem List   Diagnosis Date Noted Date Diagnosed   Low serum low density lipoprotein (LDL) 03/17/2024     Priority: High    Statin intolerant. No indication.    Type 2 diabetes mellitus treated without insulin (HCC) 06/20/2021     Priority: High    Crestor intolerant; severe myalgias; does not want other statins. LDL is naturally low.  Lab Results  Component Value Date   CHOL 104 08/07/2022   HDL 64.40 08/07/2022   LDLCALC 25 08/07/2022   TRIG 73.0 08/07/2022   CHOLHDL 2 08/07/2022       White coat syndrome without diagnosis of hypertension 01/16/2018     Priority: High   Severe obesity (BMI 35.0-35.9 with comorbidity) (HCC)      Priority: High   Acquired hypothyroidism 01/07/2011     Priority: High   Tubular adenoma of colon 07/10/2023     Priority: Medium     Colonoscopy July 2024, repeat in 7 years    Myalgia due to statin 02/17/2023     Priority: Medium    Long-term current use of proton pump inhibitor therapy 11/12/2022     Priority: Medium    Primary osteoarthritis of left knee 01/05/2019     Priority: Medium    Family history of colon cancer 01/16/2018     Priority: Medium     Grandparents, elderly    Family history of breast cancer 01/16/2018     Priority: Medium     Mom and MGM, 80s    GERD (gastroesophageal reflux disease) 06/02/2014     Priority: Medium    Irritable bowel syndrome 01/07/2011     Priority: Medium    Fatty liver 01/07/2011     Priority: Medium    Vitamin B12 deficiency 02/17/2023     Priority: Low    On monthly vit B12 injections    Visit for review of DEXA scan 08/30/2020     Priority: Low    Dexa 08/2020; normal. Recheck in 5 years    Chronic allergic rhinitis 12/30/2019     Priority: Low   Thrombocytopenia (HCC) 01/07/2019     Priority: Low    Chronic by lab review    Iron deficiency anemia 06/03/2023     Thought due to gastric erosions; healed 2024     Social History: Patient  reports that  she has never smoked. She has never used smokeless tobacco. She reports current alcohol use. She reports that she does not use drugs.  Review of Systems: Ophthalmic: negative for eye pain, loss of vision or double vision Cardiovascular: negative for chest pain Respiratory: negative for SOB or persistent cough Gastrointestinal: negative for abdominal pain Genitourinary: negative for dysuria or gross hematuria MSK: negative for foot lesions Neurologic: negative for weakness or gait disturbance  Objective  Vitals: BP 126/65   Pulse 68   Temp 97.7 F (  36.5 C)   Ht 5\' 8"  (1.727 m)   Wt 244 lb 3.2 oz (110.8 kg)   SpO2 95%   BMI 37.13 kg/m  General: well appearing, no acute distress  Psych:  Alert and oriented, normal mood and affect HEENT:  Normocephalic, atraumatic, Cardiovascular:  Nl S1 and S2, RRR without murmur, gallop or rub. no edema Respiratory:  Good breath sounds bilaterally, CTAB with normal effort, no rales  Diabetic education: ongoing education regarding chronic disease management for diabetes was given today. We continue to reinforce the ABC's of diabetic management: A1c (<7 or 8 dependent upon patient), tight blood pressure control, and cholesterol management with goal LDL < 100 minimally. We discuss diet strategies, exercise recommendations, medication options and possible side effects. At each visit, we review recommended immunizations and preventive care recommendations for diabetics and stress that good diabetic control can prevent other problems. See below for this patient's data.   Commons side effects, risks, benefits, and alternatives for medications and treatment plan prescribed today were discussed, and the patient expressed understanding of the given instructions. Patient is instructed to call or message via MyChart if he/she has any questions or concerns regarding our treatment plan. No barriers to understanding were identified. We discussed Red Flag symptoms and  signs in detail. Patient expressed understanding regarding what to do in case of urgent or emergency type symptoms.  Medication list was reconciled, printed and provided to the patient in AVS. Patient instructions and summary information was reviewed with the patient as documented in the AVS. This note was prepared with assistance of Dragon voice recognition software. Occasional wrong-word or sound-a-like substitutions may have occurred due to the inherent limitations of voice recognition software

## 2024-03-29 ENCOUNTER — Encounter: Payer: Self-pay | Admitting: Family Medicine

## 2024-03-29 NOTE — Progress Notes (Signed)
 See mychart note Dear Ms. Swango, Your urine test is fine.  Sincerely, Dr. Jonelle Neri

## 2024-03-31 DIAGNOSIS — D2371 Other benign neoplasm of skin of right lower limb, including hip: Secondary | ICD-10-CM | POA: Diagnosis not present

## 2024-03-31 DIAGNOSIS — D2372 Other benign neoplasm of skin of left lower limb, including hip: Secondary | ICD-10-CM | POA: Diagnosis not present

## 2024-03-31 DIAGNOSIS — M792 Neuralgia and neuritis, unspecified: Secondary | ICD-10-CM | POA: Diagnosis not present

## 2024-05-11 DIAGNOSIS — H02886 Meibomian gland dysfunction of left eye, unspecified eyelid: Secondary | ICD-10-CM | POA: Diagnosis not present

## 2024-05-11 DIAGNOSIS — H02883 Meibomian gland dysfunction of right eye, unspecified eyelid: Secondary | ICD-10-CM | POA: Diagnosis not present

## 2024-05-22 ENCOUNTER — Other Ambulatory Visit: Payer: Self-pay | Admitting: Family Medicine

## 2024-05-22 DIAGNOSIS — E039 Hypothyroidism, unspecified: Secondary | ICD-10-CM

## 2024-05-25 ENCOUNTER — Ambulatory Visit (INDEPENDENT_AMBULATORY_CARE_PROVIDER_SITE_OTHER): Payer: Medicare PPO | Admitting: Gastroenterology

## 2024-05-25 ENCOUNTER — Encounter (INDEPENDENT_AMBULATORY_CARE_PROVIDER_SITE_OTHER): Payer: Self-pay | Admitting: Gastroenterology

## 2024-05-25 VITALS — BP 129/69 | HR 75 | Temp 97.5°F | Ht 68.0 in | Wt 252.6 lb

## 2024-05-25 DIAGNOSIS — D509 Iron deficiency anemia, unspecified: Secondary | ICD-10-CM

## 2024-05-25 DIAGNOSIS — Z1321 Encounter for screening for nutritional disorder: Secondary | ICD-10-CM

## 2024-05-25 DIAGNOSIS — K219 Gastro-esophageal reflux disease without esophagitis: Secondary | ICD-10-CM

## 2024-05-25 DIAGNOSIS — Z8719 Personal history of other diseases of the digestive system: Secondary | ICD-10-CM

## 2024-05-25 NOTE — Patient Instructions (Signed)
 We will check blood counts, b12 and iron  levels today I will be in touch once I have these results, we may send you for more iron  infusions if iron  remains low as you did not tolerate iron  pills Continue with iron  rich diet  Follow up 6 months  It was a pleasure to see you today. I want to create trusting relationships with patients and provide genuine, compassionate, and quality care. I truly value your feedback! please be on the lookout for a survey regarding your visit with me today. I appreciate your input about our visit and your time in completing this!    Yunus Stoklosa L. Kashari Chalmers, MSN, APRN, AGNP-C Adult-Gerontology Nurse Practitioner [facility]

## 2024-05-25 NOTE — Progress Notes (Addendum)
 Referring Provider: Luevenia Saha, MD Primary Care Physician:  Luevenia Saha, MD Primary GI Physician: Dr. Sammi Crick   Chief Complaint  Patient presents with   IDA    Patient here today for a follow up on her IDA. No recent Anemia/ Fe labs drawn. Denies any current symptomatic signs of IDA. She is not taking po fe as she says this makes her sick on her stomach. She does eat a diet of iron  rich foods.   HPI:   Shelly [name] is a 68 y.o. female with past medical history of BCC, DM, GERD, hypothyroidism, IBS, OA, thrombopenia   Patient presenting today for:  Follow up of GERD and IDA  Last seen December 2024, at that time feeling better since iron  infusions x3. No recent labs drawn. Not taking Iron  pills as she did not tolerate. GERD well controlled on omeprazole  40mg  BID.   Recommended to decrease omeprazole  20mg  BID, repeat iron  studies, avoid NSAIDs, consider decrease to omeprazole  20mg  daily at follow up  Last labs in December with TIBC 452, Iron  50, Sat 11, ferrtin 26, hgb 14 Recommended to try PO iron  every other day  Present:  She states she got the ferrex iron  but this made her sick to her stomach as wel. No further iron  infusions since her last prior to December. She is feeling somewhat fatigued. Notes she feels better about 2 weeks after her b12 injections and then this wears off and she starts to feel more tired (last injection was may 18th). No sob or dizziness. No blood in stools or black stools. Is trying to do iron  rich diet with plenty of greens which keeps her bowels moving well. She denies any abdominal pain, changes in appetite, nausea, vomiting, constipation or diarrhea.   GERD doing omeprazole  20mg  daily with no breakthrough symptoms. PCP wanted her to try dropping to once daily dosing to see how she did. Denies dysphagia, odynophagia.   Last Colonoscopy:06/2023- The examined portion of the ileum was normal.                           - One 1 mm polyp in  the ascending colon, removed                            with a cold biopsy forceps. Resected and retrieved.                           - One 2 mm polyp in the rectum, removed with a cold                            snare. Resected and retrieved.                           - The distal rectum and anal verge are normal on                            retroflexion view.   Last Endoscopy: 06/2023- Normal esophagus.                           - Two bleeding angiodysplastic lesions in the  stomach. Treated with argon plasma coagulation                            (APC).                           - A single gastric polyp. Resected and retrieved.                            Clips were placed. Clip manufacturer: [employer].                           - Normal examined duodenum. A. STOMACH, POLYPECTOMY: - Hyperplastic polyp   B. COLON, ASCENDING, POLYPECTOMY: - Tubular adenoma. - No high grade dysplasia or malignancy.   C. RECTUM, POLYPECTOMY: - Hyperplastic polyp. - No dysplasia or malignancy.   Recommendations:  Repeat colonoscopy 7 years   Past Medical History:  Diagnosis Date   Cancer (HCC)    Basal Cell    Diabetes mellitus without complication (HCC)    GERD (gastroesophageal reflux disease)    Headache(784.0)    Hypothyroidism    Irritable bowel syndrome (IBS)    Primary osteoarthritis of left knee 01/05/2019   Thrombopenia (HCC) 01/07/2019   Chronic by lab review    Past Surgical History:  Procedure Laterality Date   ABDOMINAL HYSTERECTOMY     APPENDECTOMY  1991   COLONOSCOPY N/A 06/19/2015   Procedure: COLONOSCOPY;  Surgeon: Ruby Corporal, MD;  [facility];  Service: Endoscopy;  Laterality: N/A;  8:30-9:30   COLONOSCOPY WITH PROPOFOL  N/A 09/20/2020   Procedure: COLONOSCOPY WITH PROPOFOL ;  Surgeon: Ruby Corporal, MD;  Location: [facility];  Service: Endoscopy;  Laterality: N/A;  1015   COLONOSCOPY WITH  PROPOFOL  N/A 07/09/2023   Procedure: COLONOSCOPY WITH PROPOFOL ;  Surgeon: Urban Garden, MD;  [facility]: [facility];  Service: Gastroenterology;  Laterality: N/A;  8:45AM;ASA 2   cyst removed     right arm   ESOPHAGOGASTRODUODENOSCOPY (EGD) WITH PROPOFOL  N/A 07/09/2023   Procedure: ESOPHAGOGASTRODUODENOSCOPY (EGD) WITH PROPOFOL ;  Surgeon: Urban Garden, MD;  [facility]: [facility];  Service: Gastroenterology;  Laterality: N/A;  8:45AM;ASA 2   FLEXIBLE SIGMOIDOSCOPY  07/23/2011   Procedure: FLEXIBLE SIGMOIDOSCOPY;  Surgeon: Ruby Corporal, MD;  [facility]: [facility];  Service: Endoscopy;  Laterality: N/A;   HEMOSTASIS CLIP PLACEMENT  07/09/2023   Procedure: HEMOSTASIS CLIP PLACEMENT;  Surgeon: Urban Garden, MD;  Location: [facility];  Service: Gastroenterology;;   HOT HEMOSTASIS  07/09/2023   Procedure: HOT HEMOSTASIS (ARGON PLASMA COAGULATION/BICAP);  Surgeon: Umberto Ganong, Bearl Limes, MD;  Location: [facility];  Service: Gastroenterology;;   POLYPECTOMY  07/09/2023   Procedure: POLYPECTOMY;  Surgeon: Umberto Ganong, Bearl Limes, MD;  Location: [facility];  Service: Gastroenterology;;   TONSILLECTOMY AND ADENOIDECTOMY      Current Outpatient Medications  Medication Sig Dispense Refill   Artificial Tear Solution (SOOTHE XP OP) Place 1 drop into both eyes 3 (three) times daily as needed (dry/irritated eyes.).     cyanocobalamin  (VITAMIN B12) 1000 MCG/ML injection inject 1 MILLILITER (1000 MICROGRAM total) into the muscle every 30 days. 3 mL 1  fexofenadine (ALLEGRA) 180 MG tablet Take 180 mg by mouth in the morning.     hyoscyamine  (LEVSIN  SL) 0.125 MG SL tablet place 1 tablet under the tongue 3 times daily as needed 90 tablet 0   ibuprofen (ADVIL) 200 MG tablet Take 400 mg by mouth every 8 (eight) hours as needed (pain.).     Iron  Combinations (NIFEREX ) TABS Take 150 mg by mouth daily. 30 tablet 2   levothyroxine  (SYNTHROID ) 100 MCG tablet take 1  tablet once daily before breakfast. 90 tablet 0   metFORMIN  (GLUCOPHAGE -XR) 750 MG 24 hr tablet Take 1 tablet (750 mg total) by mouth daily with breakfast. 90 tablet 3   omeprazole  (PRILOSEC) 20 MG capsule Take 1 capsule (20 mg total) by mouth 2 (two) times daily before a meal. 120 capsule 2   No current facility-administered medications for this visit.    Allergies as of 05/25/2024 - Review Complete 03/17/2024  Allergen Reaction Noted   Propoxyphene n-acetaminophen  Hives and Palpitations     Social History   Socioeconomic History   Marital status: Married    Spouse name: Not on file   Number of children: Not on file   Years of education: Not on file   Highest education level: Not on file  Occupational History   Not on file  Tobacco Use   Smoking status: Never   Smokeless tobacco: Never  Vaping Use   Vaping status: Never Used  Substance and Sexual Activity   Alcohol use: Yes    Comment: social   Drug use: No   Sexual activity: Yes    Birth control/protection: Surgical  Other Topics Concern   Not on file  Social History Narrative   Not on file   Social Drivers of Health   Financial Resource Strain: Low Risk  (09/15/2023)   Overall Financial Resource Strain (CARDIA)    Difficulty of Paying Living Expenses: Not hard at all  Food Insecurity: No Food Insecurity (09/15/2023)   Hunger Vital Sign    Worried About Running Out of Food in the Last Year: Never true    Ran Out of Food in the Last Year: Never true  Transportation Needs: No Transportation Needs (09/15/2023)   PRAPARE - Administrator, Civil Service (Medical): No    Lack of Transportation (Non-Medical): No  Physical Activity: Sufficiently Active (09/15/2023)   Exercise Vital Sign    Days of Exercise per Week: 7 days    Minutes of Exercise per Session: 30 min  Stress: No Stress Concern Present (09/15/2023)   [employer] of Occupational Health - Occupational Stress Questionnaire    Feeling of Stress  : Not at all  Social Connections: Socially Integrated (09/15/2023)   Social Connection and Isolation Panel    Frequency of Communication with Friends and Family: More than three times a week    Frequency of Social Gatherings with Friends and Family: More than three times a week    Attends Religious Services: More than 4 times per year    Active Member of [employer] or Organizations: Yes    Attends Banker Meetings: 1 to 4 times per year    Marital Status: Married    Review of systems General: negative for  night sweats, fever, chills, weight loss +fatigue  Neck: Negative for lumps, goiter, pain and significant neck swelling Resp: Negative for cough, wheezing, dyspnea at rest CV: Negative for chest pain, leg swelling, palpitations, orthopnea GI: denies melena, hematochezia, nausea, vomiting, diarrhea, constipation,  dysphagia, odyonophagia, early satiety or unintentional weight loss.  MSK: Negative for joint pain or swelling, back pain, and muscle pain. Derm: Negative for itching or rash Psych: Denies depression, anxiety, memory loss, confusion. No homicidal or suicidal ideation.  Heme: Negative for prolonged bleeding, bruising easily, and swollen nodes. Endocrine: Negative for cold or heat intolerance, polyuria, polydipsia and goiter. Neuro: negative for tremor, gait imbalance, syncope and seizures. The remainder of the review of systems is noncontributory.  Physical Exam: There were no vitals taken for this visit. General:   Alert and oriented. No distress noted. Pleasant and cooperative.  Head:  Normocephalic and atraumatic. Eyes:  Conjuctiva clear without scleral icterus. Mouth:  Oral mucosa pink and moist. Good dentition. No lesions. Heart: Normal rate and rhythm, s1 and s2 heart sounds present.  Lungs: Clear lung sounds in all lobes. Respirations equal and unlabored. Abdomen:  +BS, soft, non-tender and non-distended. No rebound or guarding. No HSM or masses noted. Derm:  No palmar erythema or jaundice Msk:  Symmetrical without gross deformities. Normal posture. Extremities:  Without edema. Neurologic:  Alert and  oriented x4 Psych:  Alert and cooperative. Normal mood and affect.  Invalid input(s): 6 MONTHS   ASSESSMENT: Shelly [name] is a 68 y.o. female presenting today for follow up of GERD and IDA  IDA: EGD and Colonoscopy as outlined above, IDA though secondary to gastric angiodysplastic lesions which were treated with APC. She has not tolerated PO iron  thus far. Felt some improvement after iron  infusions time 3 in late 2024, noting some more fatigue recently though also correlating to this worsening about 2 weeks prior to her B12 injections. Doing iron  rich diet, no rectal bleeding or melena. Will recheck iron  levels today, CBC and also B12 and folate to ensure these are not contributing to her fatigue. Should continue iron  rich diet. Consider hematology referral for further management of IDA/infusions if Iron  levels remain low.   GERD: well managed on omeprazole  20mg  daily, no breakthrough, dysphagia or odynophagia, will continue with low dose PPI, good reflux precautions.   PLAN:  -CBC, B12, Iron  studies -consider repeat iron  infusions/hematology referral if iron  remains low -continue iron  rich diet -continue omeprazole  20mg  daily  All questions were answered, patient verbalized understanding and is in agreement with plan as outlined above.   Follow Up: 6 months   Kamill Fulbright L. Travas Schexnayder, MSN, APRN, AGNP-C Adult-Gerontology Nurse Practitioner [facility]  I have reviewed the note and agree with the APP's assessment as described in this progress note  If refractory iron  deficiency or worsening anemia, we will need to proceed with capsule endoscopy.  Samantha Cress, MD Gastroenterology and Hepatology [facility]

## 2024-05-26 ENCOUNTER — Ambulatory Visit (INDEPENDENT_AMBULATORY_CARE_PROVIDER_SITE_OTHER): Payer: Self-pay | Admitting: Gastroenterology

## 2024-05-26 LAB — IRON,TIBC AND FERRITIN PANEL
Ferritin: 21 ng/mL (ref 15–150)
Iron Saturation: 7 % — CL (ref 15–55)
Iron: 33 ug/dL (ref 27–139)
Total Iron Binding Capacity: 480 ug/dL — ABNORMAL HIGH (ref 250–450)
UIBC: 447 ug/dL — ABNORMAL HIGH (ref 118–369)

## 2024-05-26 LAB — CBC
Hematocrit: 40.4 % (ref 34.0–46.6)
Hemoglobin: 12.6 g/dL (ref 11.1–15.9)
MCH: 25.6 pg — ABNORMAL LOW (ref 26.6–33.0)
MCHC: 31.2 g/dL — ABNORMAL LOW (ref 31.5–35.7)
MCV: 82 fL (ref 79–97)
Platelets: 137 10*3/uL — ABNORMAL LOW (ref 150–450)
RBC: 4.93 x10E6/uL (ref 3.77–5.28)
RDW: 15.6 % — ABNORMAL HIGH (ref 11.7–15.4)
WBC: 6.4 10*3/uL (ref 3.4–10.8)

## 2024-05-26 LAB — B12 AND FOLATE PANEL
Folate: 8.5 ng/mL (ref >3.0)
Vitamin B-12: 550 pg/mL (ref 232–1245)

## 2024-06-07 ENCOUNTER — Inpatient Hospital Stay: Attending: Hematology | Admitting: Hematology

## 2024-06-07 ENCOUNTER — Encounter: Payer: Self-pay | Admitting: Hematology

## 2024-06-07 ENCOUNTER — Inpatient Hospital Stay

## 2024-06-07 VITALS — BP 147/87 | HR 81 | Temp 98.0°F | Resp 16 | Ht 68.0 in | Wt 250.7 lb

## 2024-06-07 DIAGNOSIS — E611 Iron deficiency: Secondary | ICD-10-CM | POA: Insufficient documentation

## 2024-06-07 DIAGNOSIS — Z79899 Other long term (current) drug therapy: Secondary | ICD-10-CM | POA: Diagnosis not present

## 2024-06-07 DIAGNOSIS — D696 Thrombocytopenia, unspecified: Secondary | ICD-10-CM | POA: Insufficient documentation

## 2024-06-07 DIAGNOSIS — Z800 Family history of malignant neoplasm of digestive organs: Secondary | ICD-10-CM | POA: Diagnosis not present

## 2024-06-07 DIAGNOSIS — Z8041 Family history of malignant neoplasm of ovary: Secondary | ICD-10-CM | POA: Insufficient documentation

## 2024-06-07 DIAGNOSIS — D508 Other iron deficiency anemias: Secondary | ICD-10-CM | POA: Diagnosis not present

## 2024-06-07 DIAGNOSIS — Z803 Family history of malignant neoplasm of breast: Secondary | ICD-10-CM | POA: Diagnosis not present

## 2024-06-07 NOTE — Patient Instructions (Signed)
 You were seen and examined today by Dr. Rogers. Dr. Rogers is a hematologist, meaning that he specializes in blood abnormalities. Dr. Rogers discussed your past medical history, family history of cancers/blood conditions and the events that led to you being here today.  You were referred to Dr. Katragadda due to iron  deficiency. We will arrange for you to receive an iron  infusion here in our clinic to help correct this.   Follow-up as scheduled.

## 2024-06-07 NOTE — Progress Notes (Signed)
 [facility] [address] [place] [address]   Clinic Day:  06/07/2024  Referring physician: Jodie Lavern CROME, [name]  Patient Care Team: Shelly [name] as PCP - General (Family Medicine) Shelly Shelly PENNER, [name] (Inactive) (Gastroenterology) Shelly Kocher, [name] (Dermatology) Shelly Lonni SAUNDERS, [name] as Consulting Physician (Orthopedic Surgery) Shelly [name] as Consulting Physician (Ophthalmology)   ASSESSMENT & PLAN:   Assessment:  1.  Iron  deficiency state: - Patient seen at the request of Shelly Boettcher, NP - Last colonoscopy/EGD on 07/09/2023 - Labs on 05/25/2024: Ferritin low at 21.  Previously received Feraheme in July 2024 as she cannot tolerate oral iron . - She is found to have mild intermittent thrombocytopenia for the last several years.  2.  Social/family history: - She is a non-smoker. - Mother had breast cancer.  Sister had ovarian cancer.  Maternal aunt and uncle had colon cancer.  Plan:  1.  Iron  deficiency state: - We talked about parenteral iron  therapy with INFeD 1 g IV x 1.  We discussed rare chance of anaphylactic reaction. - We will schedule her for iron  infusion.  RTC 3 months with repeat CBC, ferritin and iron  panel.  2.  Mild thrombocytopenia: - We discussed the differential diagnosis.  She has questionable fatty liver in 2021 by ultrasound.  I have recommended ultrasound of the spleen to evaluate for splenomegaly. - Will also do nutritional deficiency workup and infectious etiology workup prior to next visit.   Orders Placed This Encounter  Procedures   US  SPLEEN (ABDOMEN LIMITED)    Standing Status:   Future    Expected Date:   09/07/2024    Expiration Date:   06/07/2025    Reason for Exam (SYMPTOM  OR DIAGNOSIS REQUIRED):   thrombocytopenia    Preferred imaging location?:   [facility]   CBC with Differential    Standing Status:   Future    Expected Date:   09/06/2024    Expiration Date:   12/05/2024   Iron  and TIBC  ([facility] ONLY)    Standing Status:   Future    Expected Date:   09/06/2024    Expiration Date:   12/05/2024   Ferritin    Standing Status:   Future    Expected Date:   09/06/2024    Expiration Date:   12/05/2024   Hepatitis C antibody    Standing Status:   Future    Expected Date:   09/06/2024    Expiration Date:   12/05/2024   Hepatitis B core antibody, total    Standing Status:   Future    Expected Date:   09/06/2024    Expiration Date:   12/05/2024   Hepatitis B surface antibody,qualitative    Standing Status:   Future    Expected Date:   09/06/2024    Expiration Date:   12/05/2024   Hepatitis B surface antigen    Standing Status:   Future    Expected Date:   09/06/2024    Expiration Date:   12/05/2024   Immature Platelet Fraction    Standing Status:   Future    Expected Date:   09/06/2024    Expiration Date:   12/05/2024   Methylmalonic acid, serum    Standing Status:   Future    Expected Date:   09/06/2024    Expiration Date:   12/05/2024   Copper, serum    Standing Status:   Future    Expected Date:  09/06/2024    Expiration Date:   12/05/2024      Shelly [name],acting as a scribe for Shelly [name].,have documented all relevant documentation on the behalf of Shelly [name],as directed by  Shelly [name] while in the presence of Shelly [name].   I, Shelly [name], have reviewed the above documentation for accuracy and completeness, and I agree with the above.   Shelly [name]   6/30/20259:05 AM  CHIEF COMPLAINT/PURPOSE OF CONSULT:   Diagnosis: Iron  deficiency state  Current Therapy: Parenteral iron  therapy  HISTORY OF PRESENT ILLNESS:   Shelly [name] is a 68 y.o. female presenting to clinic today for evaluation of iron  deficiency at the request of Shelly Cree, NP.  Patient has a medical history of DM type II, hypothyroidism, fatty liver, GERD, and IBS.   [name] was seen by  gastro on 05/25/24 for follow-up of IDA and labs done the same day. After low iron  remained in lab work, she was referred to me. During this visit, she states she is unable to tolerate oral iron  supplements as they make her feel sick. Shelly [name] was noted to have an iron  infusion in December 2024 per GI note and she had 510 mg of Feraheme at [facility] on 06/17/23.   Her CBC from 05/25/24 showed low MCH at 25.6, low MCHC at 31.2, elevated RDW at 15.6, and low platelets at 137. Vitamin B 12 and folate panel from 05/25/24 found normal B 12 at 550 and normal folate at 8.5. Ferritin and iron  panel from 05/25/24 showed: low iron  saturation at 7, normal ferritin at 21, elevated TIBC at 480, and elevated UIBC at 447.   Her most recent colonoscopy/EGD was on 07/09/23 with Dr. Kendell. The colonoscopy found: one 1 mm polyp in the ascending colon, one 2 mm polyp in the rectum, normal ileum, and normal distal rectum and anal verge. EGD found: two bleeding angiodysplastic lesions in the stomach treated with APC, a single gastric polyp, and a normal duodenum.   Today, she states that she is doing well overall. Her appetite level is at 100%. Her energy level is at 60%.  PAST MEDICAL HISTORY:   Past Medical History: Past Medical History:  Diagnosis Date   Cancer (HCC)    Basal Cell    Diabetes mellitus without complication (HCC)    GERD (gastroesophageal reflux disease)    Headache(784.0)    Hypothyroidism    Irritable bowel syndrome (IBS)    Primary osteoarthritis of left knee 01/05/2019   Thrombopenia (HCC) 01/07/2019   Chronic by lab review    Surgical History: Past Surgical History:  Procedure Laterality Date   ABDOMINAL HYSTERECTOMY     APPENDECTOMY  1991   COLONOSCOPY N/A 06/19/2015   Procedure: COLONOSCOPY;  Surgeon: Shelly [name] Rivet, [name];  [facility];  Service: Endoscopy;  Laterality: N/A;  8:30-9:30   COLONOSCOPY WITH PROPOFOL  N/A 09/20/2020   Procedure: COLONOSCOPY WITH PROPOFOL ;  Surgeon: [name] Shelly RAYMOND, [name];  [facility];  Service: Endoscopy;  Laterality: N/A;  1015   COLONOSCOPY WITH PROPOFOL  N/A 07/09/2023   Procedure: COLONOSCOPY WITH PROPOFOL ;  Surgeon: Shelly Angelia Sieving, [name];  [facility];  Service: Gastroenterology;  Laterality: N/A;  8:45AM;ASA 2   cyst removed     right arm   ESOPHAGOGASTRODUODENOSCOPY (EGD) WITH PROPOFOL  N/A 07/09/2023   Procedure: ESOPHAGOGASTRODUODENOSCOPY (EGD) WITH PROPOFOL ;  Surgeon: Shelly Angelia Sieving, [name];  [facility];  Service: Gastroenterology;  Laterality: N/A;  8:45AM;ASA 2   FLEXIBLE SIGMOIDOSCOPY  07/23/2011   Procedure: FLEXIBLE SIGMOIDOSCOPY;  Surgeon: Shelly [name] Rivet, [name];  [facility];  Service: Endoscopy;  Laterality: N/A;   HEMOSTASIS CLIP PLACEMENT  07/09/2023   Procedure: HEMOSTASIS CLIP PLACEMENT;  Surgeon: Shelly Angelia Sieving, [name];  [facility];  Service: Gastroenterology;;   HOT HEMOSTASIS  07/09/2023   Procedure: HOT HEMOSTASIS (ARGON PLASMA COAGULATION/BICAP);  Surgeon: Shelly Angelia, Sieving, [name];  [facility];  Service: Gastroenterology;;   POLYPECTOMY  07/09/2023   Procedure: POLYPECTOMY;  Surgeon: Shelly Angelia, Sieving, [name];  [facility];  Service: Gastroenterology;;   TONSILLECTOMY AND ADENOIDECTOMY      Social History: Social History   Socioeconomic History   Marital status: Married    Spouse name: Not on file   Number of children: Not on file   Years of education: Not on file   Highest education level: Not on file  Occupational History   Not on file  Tobacco Use   Smoking status: Never   Smokeless tobacco: Never  Vaping Use   Vaping status: Never Used  Substance and Sexual Activity   Alcohol use: Yes    Comment: social   Drug use: No   Sexual activity: Yes    Birth control/protection: Surgical  Other Topics Concern   Not on file  Social History Narrative   Not on file   Social Drivers of Health    Financial Resource Strain: Low Risk  (09/15/2023)   Overall Financial Resource Strain (CARDIA)    Difficulty of Paying Living Expenses: Not hard at all  Food Insecurity: No Food Insecurity (06/07/2024)   Hunger Vital Sign    Worried About Running Out of Food in the Last Year: Never true    Ran Out of Food in the Last Year: Never true  Transportation Needs: No Transportation Needs (06/07/2024)   PRAPARE - Administrator, Civil Service (Medical): No    Lack of Transportation (Non-Medical): No  Physical Activity: Sufficiently Active (09/15/2023)   Exercise Vital Sign    Days of Exercise per Week: 7 days    Minutes of Exercise per Session: 30 min  Stress: No Stress Concern Present (09/15/2023)   [employer] of Occupational Health - Occupational Stress Questionnaire    Feeling of Stress : Not at all  Social Connections: Socially Integrated (09/15/2023)   Social Connection and Isolation Panel    Frequency of Communication with Friends and Family: More than three times a week    Frequency of Social Gatherings with Friends and Family: More than three times a week    Attends Religious Services: More than 4 times per year    Active Member of [employer] or Organizations: Yes    Attends Banker Meetings: 1 to 4 times per year    Marital Status: Married  Catering manager Violence: Not At Risk (06/07/2024)   Humiliation, Afraid, Rape, and Kick questionnaire    Fear of Current or Ex-Partner: No    Emotionally Abused: No    Physically Abused: No    Sexually Abused: No    Family History: Family History  Problem Relation Age of Onset   Breast cancer Mother 48   Atrial fibrillation Mother 29       s/p ablation   Breast cancer Maternal Grandmother 68   Colon cancer Maternal Aunt 80   Colon cancer Maternal Uncle 79    Current Medications:  Current Outpatient Medications:  Artificial Tear Solution (SOOTHE XP OP), Place 1 drop into both eyes 3 (three) times daily as  needed (dry/irritated eyes.)., Disp: , Rfl:    cyanocobalamin  (VITAMIN B12) 1000 MCG/ML injection, inject 1 MILLILITER (1000 MICROGRAM total) into the muscle every 30 days., Disp: 3 mL, Rfl: 1   fexofenadine (ALLEGRA) 180 MG tablet, Take 180 mg by mouth in the morning., Disp: , Rfl:    hyoscyamine  (LEVSIN  SL) 0.125 MG SL tablet, place 1 tablet under the tongue 3 times daily as needed, Disp: 90 tablet, Rfl: 0   ibuprofen (ADVIL) 200 MG tablet, Take 400 mg by mouth every 8 (eight) hours as needed (pain.)., Disp: , Rfl:    levothyroxine  (SYNTHROID ) 100 MCG tablet, take 1 tablet once daily before breakfast., Disp: 90 tablet, Rfl: 0   metFORMIN  (GLUCOPHAGE -XR) 750 MG 24 hr tablet, Take 1 tablet (750 mg total) by mouth daily with breakfast., Disp: 90 tablet, Rfl: 3   omeprazole  (PRILOSEC) 20 MG capsule, Take 1 capsule (20 mg total) by mouth 2 (two) times daily before a meal. (Patient taking differently: Take 20 mg by mouth daily.), Disp: 120 capsule, Rfl: 2   ULTICARE TUBERCULIN SAFETY SYR 25G X 1 1 ML MISC, for 30 Days, Disp: , Rfl:    Allergies: Allergies  Allergen Reactions   Propoxyphene N-Acetaminophen  Hives and Palpitations    REVIEW OF SYSTEMS:   Review of Systems  Constitutional:  Negative for chills, fatigue and fever.  HENT:   Negative for lump/mass, mouth sores, nosebleeds, sore throat and trouble swallowing.   Eyes:  Negative for eye problems.  Respiratory:  Positive for cough. Negative for shortness of breath.   Cardiovascular:  Negative for chest pain, leg swelling and palpitations.  Gastrointestinal:  Positive for diarrhea and nausea. Negative for abdominal pain, constipation and vomiting.  Genitourinary:  Negative for bladder incontinence, difficulty urinating, dysuria, frequency, hematuria and nocturia.   Musculoskeletal:  Negative for arthralgias, back pain, flank pain, myalgias and neck pain.  Skin:  Negative for itching and rash.  Neurological:  Negative for dizziness,  headaches and numbness.  Hematological:  Does not bruise/bleed easily.  Psychiatric/Behavioral:  Positive for sleep disturbance. Negative for depression and suicidal ideas. The patient is not nervous/anxious.   All other systems reviewed and are negative.    VITALS:   Blood pressure (!) 147/87, pulse 81, temperature 98 F (36.7 C), temperature source Oral, resp. rate 16, height 5' 8 (1.727 m), weight 250 lb 10.6 oz (113.7 kg), SpO2 92%.  Wt Readings from Last 3 Encounters:  06/07/24 250 lb 10.6 oz (113.7 kg)  05/25/24 252 lb 9.6 oz (114.6 kg)  03/17/24 244 lb 3.2 oz (110.8 kg)    Body mass index is 38.11 kg/m.   PHYSICAL EXAM:   Physical Exam Vitals and nursing note reviewed. Exam conducted with a chaperone present.  Constitutional:      Appearance: Normal appearance.   Cardiovascular:     Rate and Rhythm: Normal rate and regular rhythm.     Pulses: Normal pulses.     Heart sounds: Normal heart sounds.  Pulmonary:     Effort: Pulmonary effort is normal.     Breath sounds: Normal breath sounds.  Abdominal:     Palpations: Abdomen is soft. There is no hepatomegaly, splenomegaly or mass.     Tenderness: There is no abdominal tenderness.   Musculoskeletal:     Right lower leg: No edema.     Left lower leg: No edema.  Lymphadenopathy:  Cervical: No cervical adenopathy.     Right cervical: No superficial, deep or posterior cervical adenopathy.    Left cervical: No superficial, deep or posterior cervical adenopathy.     Upper Body:     Right upper body: No supraclavicular or axillary adenopathy.     Left upper body: No supraclavicular or axillary adenopathy.   Neurological:     General: No focal deficit present.     Mental Status: She is alert and oriented to person, place, and time.   Psychiatric:        Mood and Affect: Mood normal.        Behavior: Behavior normal.     LABS:   CBC    Component Value Date/Time   WBC 6.4 05/25/2024 1102   WBC 5.8  09/17/2023 1050   RBC 4.93 05/25/2024 1102   RBC 5.12 (H) 09/17/2023 1050   HGB 12.6 05/25/2024 1102   HCT 40.4 05/25/2024 1102   PLT 137 (L) 05/25/2024 1102   MCV 82 05/25/2024 1102   MCH 25.6 (L) 05/25/2024 1102   MCH 26.3 09/18/2020 1140   MCHC 31.2 (L) 05/25/2024 1102   MCHC 32.9 09/17/2023 1050   RDW 15.6 (H) 05/25/2024 1102   LYMPHSABS 2.0 09/17/2023 1050   MONOABS 0.6 09/17/2023 1050   EOSABS 0.2 09/17/2023 1050   BASOSABS 0.0 09/17/2023 1050    CMP    Component Value Date/Time   NA 138 09/17/2023 1050   NA 140 03/23/2014 0000   K 4.0 09/17/2023 1050   CL 105 09/17/2023 1050   CO2 24 09/17/2023 1050   GLUCOSE 118 (H) 09/17/2023 1050   BUN 12 09/17/2023 1050   BUN 14 03/23/2014 0000   CREATININE 0.72 09/17/2023 1050   CALCIUM  9.0 09/17/2023 1050   PROT 6.3 09/17/2023 1050   ALBUMIN 3.8 09/17/2023 1050   AST 58 (H) 09/17/2023 1050   ALT 54 (H) 09/17/2023 1050   ALKPHOS 106 09/17/2023 1050   BILITOT 0.8 09/17/2023 1050   GFRNONAA >60 09/18/2020 1140    No results found for: CEA1, CEA / No results found for: CEA1, CEA No results found for: PSA1 No results found for: CAN199 No results found for: CAN125  No results found for: TOTALPROTELP, ALBUMINELP, A1GS, A2GS, BETS, BETA2SER, GAMS, MSPIKE, SPEI Lab Results  Component Value Date   TIBC 480 (H) 05/25/2024   TIBC 452 (H) 11/25/2023   TIBC 507 (H) 06/02/2023   FERRITIN 21 05/25/2024   FERRITIN 26 11/25/2023   FERRITIN 10 (L) 06/02/2023   IRONPCTSAT 7 (LL) 05/25/2024   IRONPCTSAT 11 (L) 11/25/2023   IRONPCTSAT 3 (LL) 06/02/2023   No results found for: LDH   STUDIES:   No results found.

## 2024-06-21 ENCOUNTER — Inpatient Hospital Stay: Attending: Hematology

## 2024-06-21 VITALS — BP 123/72 | HR 74 | Temp 97.5°F | Resp 17 | Ht 68.0 in | Wt 251.0 lb

## 2024-06-21 DIAGNOSIS — D696 Thrombocytopenia, unspecified: Secondary | ICD-10-CM | POA: Insufficient documentation

## 2024-06-21 DIAGNOSIS — E611 Iron deficiency: Secondary | ICD-10-CM | POA: Insufficient documentation

## 2024-06-21 DIAGNOSIS — D509 Iron deficiency anemia, unspecified: Secondary | ICD-10-CM

## 2024-06-21 MED ORDER — METHYLPREDNISOLONE SODIUM SUCC 125 MG IJ SOLR
125.0000 mg | Freq: Once | INTRAMUSCULAR | Status: AC
  Administered 2024-06-21: 125 mg via INTRAVENOUS
  Filled 2024-06-21: qty 2

## 2024-06-21 MED ORDER — ACETAMINOPHEN 325 MG PO TABS
650.0000 mg | ORAL_TABLET | Freq: Once | ORAL | Status: AC
  Administered 2024-06-21: 650 mg via ORAL
  Filled 2024-06-21: qty 2

## 2024-06-21 MED ORDER — FAMOTIDINE IN NACL 20-0.9 MG/50ML-% IV SOLN
20.0000 mg | Freq: Once | INTRAVENOUS | Status: AC
  Administered 2024-06-21: 20 mg via INTRAVENOUS
  Filled 2024-06-21: qty 50

## 2024-06-21 MED ORDER — SODIUM CHLORIDE 0.9 % IV SOLN: Freq: Once | INTRAVENOUS | Status: AC

## 2024-06-21 MED ORDER — SODIUM CHLORIDE 0.9 % IV SOLN
50.0000 mg | Freq: Once | INTRAVENOUS | Status: AC
  Administered 2024-06-21: 50 mg via INTRAVENOUS
  Filled 2024-06-21: qty 1

## 2024-06-21 MED ORDER — SODIUM CHLORIDE 0.9 % IV SOLN
950.0000 mg | Freq: Once | INTRAVENOUS | Status: AC
  Administered 2024-06-21: 950 mg via INTRAVENOUS
  Filled 2024-06-21: qty 19

## 2024-06-21 MED ORDER — CETIRIZINE HCL 10 MG/ML IV SOLN
10.0000 mg | Freq: Once | INTRAVENOUS | Status: AC
  Administered 2024-06-21: 10 mg via INTRAVENOUS
  Filled 2024-06-21: qty 1

## 2024-06-21 NOTE — Progress Notes (Signed)
Patient presents today for iron infusion.  Patient is in satisfactory condition with no new complaints voiced.  Vital signs are stable.  IV placed in L arm.  IV flushed well with good blood return noted.   We will proceed with infusion per provider orders.    Patient tolerated infusion well with no complaints voiced.  Patient left ambulatory in stable condition.  Vital signs stable at discharge.  Follow up as scheduled.    

## 2024-06-21 NOTE — Patient Instructions (Signed)
 [facility] - A DEPT OF [facility]  Discharge Instructions: Thank you for choosing [facility] to provide your oncology and hematology care.  If you have a lab appointment with the Cancer Center - please note that after April 8th, 2024, all labs will be drawn in the cancer center.  You do not have to check in or register with the main entrance as you have in the past but will complete your check-in in the cancer center.  Wear comfortable clothing and clothing appropriate for easy access to any Portacath or PICC line.   We strive to give you quality time with your provider. You may need to reschedule your appointment if you arrive late (15 or more minutes).  Arriving late affects you and other patients whose appointments are after yours.  Also, if you miss three or more appointments without notifying the office, you may be dismissed from the clinic at the provider's discretion.      For prescription refill requests, have your pharmacy contact our office and allow 72 hours for refills to be completed.    Today you received the following chemotherapy and/or immunotherapy agents Infed. Iron Dextran Injection What is this medication? IRON DEXTRAN (EYE ern DEX tran) treats low levels of iron in your body. Iron is a mineral that plays an important role in making red blood cells, which carry oxygen from your lungs to the rest of your body. This medicine may be used for other purposes; ask your health care provider or pharmacist if you have questions. COMMON BRAND NAME(S): Dexferrum, INFeD What should I tell my care team before I take this medication? They need to know if you have any of these conditions: Anemia not caused by low iron levels Heart disease High levels of iron in the blood Kidney disease Liver disease An unusual or allergic reaction to iron, other medications, foods, dyes, or preservatives Pregnant or trying to get  pregnant Breastfeeding How should I use this medication? This medication is injected into a vein or a muscle. It is given by your care team in a hospital or clinic setting. Talk to your care team about the use of this medication in children. While it may be prescribed for children as young as 4 months for selected conditions, precautions do apply. Overdosage: If you think you have taken too much of this medicine contact a poison control center or emergency room at once. NOTE: This medicine is only for you. Do not share this medicine with others. What if I miss a dose? Keep appointments for follow-up doses. It is important not to miss your dose. Call your care team if you are unable to keep an appointment. What may interact with this medication? Do not take this medication with any of the following: Deferoxamine Dimercaprol Other iron products This medication may also interact with the following: Chloramphenicol Deferasirox This list may not describe all possible interactions. Give your health care provider a list of all the medicines, herbs, non-prescription drugs, or dietary supplements you use. Also tell them if you smoke, drink alcohol, or use illegal drugs. Some items may interact with your medicine. What should I watch for while using this medication? Visit your care team for regular checks on your progress. Tell your care team if your symptoms do not start to get better or if they get worse. You may need blood work while taking this medication. You may need to eat more foods that contain  iron. Talk to your care team. Foods that contain iron include whole grains/cereals, dried fruits, beans, peas, leafy green vegetables, and organ meats (liver, kidney). Long-term use of this medication may increase your risk of some cancers. Talk to your care team about your risk of cancer. What side effects may I notice from receiving this medication? Side effects that you should report to your care  team as soon as possible: Allergic reactions--skin rash, itching, hives, swelling of the face, lips, tongue, or throat Low blood pressure--dizziness, feeling faint or lightheaded, blurry vision Shortness of breath Side effects that usually do not require medical attention (report to your care team if they continue or are bothersome): Flushing Headache Joint pain Muscle pain Nausea Pain, redness, or irritation at injection site This list may not describe all possible side effects. Call your doctor for medical advice about side effects. You may report side effects to FDA at 1-800-FDA-1088. Where should I keep my medication? This medication is given in a hospital or clinic. It will not be stored at home. NOTE: This sheet is a summary. It may not cover all possible information. If you have questions about this medicine, talk to your doctor, pharmacist, or health care provider.  2024 Elsevier/Gold Standard (2023-07-16 00:00:00)      To help prevent nausea and vomiting after your treatment, we encourage you to take your nausea medication as directed.  BELOW ARE SYMPTOMS THAT SHOULD BE REPORTED IMMEDIATELY: *FEVER GREATER THAN 100.4 F (38 C) OR HIGHER *CHILLS OR SWEATING *NAUSEA AND VOMITING THAT IS NOT CONTROLLED WITH YOUR NAUSEA MEDICATION *UNUSUAL SHORTNESS OF BREATH *UNUSUAL BRUISING OR BLEEDING *URINARY PROBLEMS (pain or burning when urinating, or frequent urination) *BOWEL PROBLEMS (unusual diarrhea, constipation, pain near the anus) TENDERNESS IN MOUTH AND THROAT WITH OR WITHOUT PRESENCE OF ULCERS (sore throat, sores in mouth, or a toothache) UNUSUAL RASH, SWELLING OR PAIN  UNUSUAL VAGINAL DISCHARGE OR ITCHING   Items with * indicate a potential emergency and should be followed up as soon as possible or go to the Emergency Department if any problems should occur.  Please show the CHEMOTHERAPY ALERT CARD or IMMUNOTHERAPY ALERT CARD at check-in to the Emergency Department and triage  nurse.  Should you have questions after your visit or need to cancel or reschedule your appointment, please contact [facility] [place] - A DEPT OF [facility] [phone]  and follow the prompts.  Office hours are 8:00 a.m. to 4:30 p.m. Monday - Friday. Please note that voicemails left after 4:00 p.m. may not be returned until the following business day.  We are closed weekends and major holidays. You have access to a nurse at all times for urgent questions. Please call the main number to the clinic [phone] and follow the prompts.  For any non-urgent questions, you may also contact your provider using MyChart. We now offer e-Visits for anyone 19 and older to request care online for non-urgent symptoms. For details visit [email or web address].   Also download the MyChart app! Go to the app store, search "MyChart", open the app, select [place], and log in with your MyChart username and password.

## 2024-08-17 ENCOUNTER — Other Ambulatory Visit: Payer: Self-pay | Admitting: Family Medicine

## 2024-08-17 DIAGNOSIS — K589 Irritable bowel syndrome without diarrhea: Secondary | ICD-10-CM

## 2024-08-24 ENCOUNTER — Other Ambulatory Visit: Payer: Self-pay | Admitting: Family Medicine

## 2024-08-24 DIAGNOSIS — E538 Deficiency of other specified B group vitamins: Secondary | ICD-10-CM

## 2024-08-27 ENCOUNTER — Other Ambulatory Visit: Payer: Self-pay | Admitting: Family Medicine

## 2024-08-27 DIAGNOSIS — E039 Hypothyroidism, unspecified: Secondary | ICD-10-CM

## 2024-08-27 MED ORDER — LEVOTHYROXINE SODIUM 100 MCG PO TABS: 100.0000 ug | ORAL_TABLET | Freq: Every day | ORAL | 3 refills | Status: AC

## 2024-08-27 NOTE — Telephone Encounter (Signed)
 Copied from CRM [id number]. Topic: Clinical - Medication Refill >> Aug 27, 2024  9:27 AM Emylou G wrote: Medication: levothyroxine  (SYNTHROID ) 100 MCG tablet  Has the patient contacted their pharmacy? Yes (Agent: If no, request that the patient contact the pharmacy for the refill. If patient does not wish to contact the pharmacy document the reason why and proceed with request.) (Agent: If yes, when and what did the pharmacy advise?) said to call us   This is the patient's preferred pharmacy:  [facility] - [place], [place] - [address] [place] [address] Phone: [phone] Fax: [phone]  Is this the correct pharmacy for this prescription? Yes If no, delete pharmacy and type the correct one.   Has the prescription been filled recently? No  Is the patient out of the medication? Yes  Has the patient been seen for an appointment in the last year OR does the patient have an upcoming appointment? Yes  Can we respond through MyChart? Yes  Agent: Please be advised that Rx refills may take up to 3 business days. We ask that you follow-up with your pharmacy.

## 2024-08-30 ENCOUNTER — Inpatient Hospital Stay: Attending: Hematology

## 2024-08-30 ENCOUNTER — Ambulatory Visit (HOSPITAL_COMMUNITY)
Admission: RE | Admit: 2024-08-30 | Discharge: 2024-08-30 | Disposition: A | Discharge status: Home / Self Care | Source: Ambulatory Visit | Attending: Oncology | Admitting: Oncology

## 2024-08-30 DIAGNOSIS — D509 Iron deficiency anemia, unspecified: Secondary | ICD-10-CM | POA: Diagnosis not present

## 2024-08-30 DIAGNOSIS — Z800 Family history of malignant neoplasm of digestive organs: Secondary | ICD-10-CM | POA: Diagnosis not present

## 2024-08-30 DIAGNOSIS — Z79899 Other long term (current) drug therapy: Secondary | ICD-10-CM | POA: Diagnosis not present

## 2024-08-30 DIAGNOSIS — D696 Thrombocytopenia, unspecified: Secondary | ICD-10-CM | POA: Insufficient documentation

## 2024-08-30 DIAGNOSIS — Z803 Family history of malignant neoplasm of breast: Secondary | ICD-10-CM | POA: Diagnosis not present

## 2024-08-30 DIAGNOSIS — Z8041 Family history of malignant neoplasm of ovary: Secondary | ICD-10-CM | POA: Insufficient documentation

## 2024-08-30 DIAGNOSIS — K317 Polyp of stomach and duodenum: Secondary | ICD-10-CM | POA: Insufficient documentation

## 2024-08-30 DIAGNOSIS — D508 Other iron deficiency anemias: Secondary | ICD-10-CM

## 2024-08-30 DIAGNOSIS — K635 Polyp of colon: Secondary | ICD-10-CM | POA: Diagnosis not present

## 2024-08-30 LAB — CBC WITH DIFFERENTIAL/PLATELET
Abs Immature Granulocytes: 0.03 K/uL (ref 0.00–0.07)
Basophils Absolute: 0.1 K/uL (ref 0.0–0.1)
Basophils Relative: 1 %
Eosinophils Absolute: 0.2 K/uL (ref 0.0–0.5)
Eosinophils Relative: 4 %
HCT: 45 % (ref 36.0–46.0)
Hemoglobin: 15.2 g/dL — ABNORMAL HIGH (ref 12.0–15.0)
Immature Granulocytes: 1 %
Lymphocytes Relative: 40 %
Lymphs Abs: 2.4 K/uL (ref 0.7–4.0)
MCH: 30.4 pg (ref 26.0–34.0)
MCHC: 33.8 g/dL (ref 30.0–36.0)
MCV: 90 fL (ref 80.0–100.0)
Monocytes Absolute: 0.6 K/uL (ref 0.1–1.0)
Monocytes Relative: 10 %
Neutro Abs: 2.6 K/uL (ref 1.7–7.7)
Neutrophils Relative %: 44 %
Platelets: 96 K/uL — ABNORMAL LOW (ref 150–400)
RBC: 5 MIL/uL (ref 3.87–5.11)
RDW: 18.6 % — ABNORMAL HIGH (ref 11.5–15.5)
WBC: 5.8 K/uL (ref 4.0–10.5)
nRBC: 0 % (ref 0.0–0.2)

## 2024-08-30 LAB — FERRITIN: Ferritin: 35 ng/mL (ref 11–307)

## 2024-08-30 LAB — HEPATITIS C ANTIBODY: HCV Ab: NONREACTIVE

## 2024-08-30 LAB — IRON AND TIBC
Iron: 112 ug/dL (ref 28–170)
Saturation Ratios: 27 % (ref 10.4–31.8)
TIBC: 421 ug/dL (ref 250–450)
UIBC: 309 ug/dL

## 2024-08-30 LAB — HEPATITIS B SURFACE ANTIBODY,QUALITATIVE: Hep B S Ab: NONREACTIVE

## 2024-08-30 LAB — HEPATITIS B SURFACE ANTIGEN: Hepatitis B Surface Ag: NONREACTIVE

## 2024-08-30 LAB — IMMATURE PLATELET FRACTION: Immature Platelet Fraction: 2.8 % (ref 1.2–8.6)

## 2024-08-31 LAB — COPPER, SERUM: Copper: 91 ug/dL (ref 80–158)

## 2024-08-31 LAB — HEPATITIS B CORE ANTIBODY, TOTAL: HEP B CORE AB: NEGATIVE

## 2024-09-01 NOTE — Progress Notes (Signed)
 Shelly [name]                                          MRN: [id number]   09/01/2024   The [facility] Quality Team Specialist reviewed this patient medical record for the purposes of chart review for care gap closure. The following were reviewed: chart review for care gap closure-kidney health evaluation for diabetes:eGFR  and uACR. No eGFR.     [facility]I Quality Team

## 2024-09-03 LAB — METHYLMALONIC ACID, SERUM: Methylmalonic Acid, Quantitative: 91 nmol/L (ref 0–378)

## 2024-09-06 NOTE — Progress Notes (Unsigned)
 [facility] [address], [place] [address]   CLINIC:  Medical Oncology/Hematology  PCP:  Jodie Lavern CROME, MD [address] [place] [address] [phone]   REASON FOR VISIT:  Follow-up for iron  deficiency state + mild thrombocytopenia  CURRENT THERAPY: Intermittent IV iron   INTERVAL HISTORY:   Shelly [name] 68 y.o. female returns for routine follow-up of iron  deficiency state and mild thrombocytopenia. She was last seen by Dr. Rogers on 06/07/2024. She received INFeD  x 1 g on 06/21/2024.  At today's visit, she reports feeling fairly well. She tolerated INFeD  fairly well apart from some upset stomach, and reports feeling improved energy after IV iron . She does have some mild recurrent fatigue. No recent rectal bleeding or melena. She reports nosebleeds 3-4 times weekly on left side, lasting < 5 minutes with dripping blood. No ice pica, headaches, lightheadedness, syncope, chest pain, or dyspnea on exertion. She has 50% energy and 50% appetite. She endorses that she is maintaining a stable weight.  ASSESSMENT & PLAN:  1.   Iron  deficiency state: - Patient seen at the request of Mitzie Boettcher, NP - Mild anemia in 2023 and 2024, with persistent iron  deficiency (ferritin <50) since at least December 2023) - Last colonoscopy/EGD on 07/09/2023: Angiodysplastic lesions with bleeding x 2 in gastric body, s/p APC.  Gastric polyp x 1 (resected and clipped).  Colon polyps x 2. - She is unable to tolerate oral iron . - Requires intermittent IV iron , most recently with INFeD  x 1 in July 2025. - Denies any recent rectal bleeding or melena, but does report frequent epistaxis - Most recent labs (08/30/2024): Hgb 15.2.  Ferritin 35, iron  saturation 27%. - PLAN:  Recommend IV iron  with INFeD  x 1  - Repeat labs with RTC 4 months  -- Referral to ENT for nosebleeds - Mild erythrocytosis noted - attention at next visit.  2.  Mild thrombocytopenia: - Mild  thrombocytopenia since 2015 - US  abdomen (09/25/2020): Probable fatty infiltration of the liver. - US  spleen (08/30/2024): No evidence of splenomegaly. -- She self-administers monthly B12 at home - Hematology workup: Normal B12 and folate.  Normal copper .  Normal MMA. Hepatitis B and hepatitis C negative. Low-normal immature platelet fraction 2.8% - Most recent CBC/D (08/30/2024) with progressive thrombocytopenia, platelets now down to 96 - PLAN:  We will check additional labs to include SPEP, immunofixation, light chains, flow cytometry, ANA, RF, anti-CCP, platelet antibody panel - Continued surveillance of platelets.  If persistent platelets <100 without other cause, we will check bone marrow biopsy.  Would also consider repeat liver imaging.  Otherwise, suspect mild chronic ITP or thrombocytopenia related to fatty liver disease.  3.  Social/family history: - She is a non-smoker. - Mother had breast cancer.  Sister had ovarian cancer.  Maternal aunt and uncle had colon cancer.  PLAN SUMMARY: >> Please place referral to ENT for recurrent nosebleeds >> Labs today = SPEP, immunofixation, light chains, flow cytometry, ANA, RF, anti-CCP, platelet antibody panel >> INFeD  x1 >> Labs in 4 months = CBC/D, ferritin, iron /TIBC >> OFFICE visit in 4 months     REVIEW OF SYSTEMS:   Review of Systems  Constitutional:  Positive for fatigue. Negative for appetite change, chills, diaphoresis, fever and unexpected weight change.  HENT:   Negative for lump/mass and nosebleeds.   Eyes:  Negative for eye problems.  Respiratory:  Negative for cough, hemoptysis and shortness of breath.   Cardiovascular:  Negative for chest pain, leg swelling  and palpitations.  Gastrointestinal:  Positive for constipation and diarrhea. Negative for abdominal pain, blood in stool, nausea and vomiting.  Genitourinary:  Negative for hematuria.   Skin: Negative.   Neurological:  Negative for dizziness, headaches and  light-headedness.  Hematological:  Does not bruise/bleed easily.  Psychiatric/Behavioral:  Positive for sleep disturbance.      PHYSICAL EXAM:  ECOG PERFORMANCE STATUS: 1 - Symptomatic but completely ambulatory  Vitals:   09/07/24 1002  BP: 132/78  Pulse: 77  Resp: 18  Temp: 98.4 F (36.9 C)  SpO2: 94%   Filed Weights   09/07/24 1002  Weight: 249 lb (112.9 kg)   Physical Exam Constitutional:      Appearance: Normal appearance. She is obese.  Cardiovascular:     Heart sounds: Normal heart sounds.  Pulmonary:     Breath sounds: Normal breath sounds.  Neurological:     General: No focal deficit present.     Mental Status: Mental status is at baseline.  Psychiatric:        Behavior: Behavior normal. Behavior is cooperative.     PAST MEDICAL/SURGICAL HISTORY:  Past Medical History:  Diagnosis Date   Cancer (HCC)    Basal Cell    Diabetes mellitus without complication (HCC)    GERD (gastroesophageal reflux disease)    Headache(784.0)    Hypothyroidism    Irritable bowel syndrome (IBS)    Primary osteoarthritis of left knee 01/05/2019   Thrombopenia 01/07/2019   Chronic by lab review   Past Surgical History:  Procedure Laterality Date   ABDOMINAL HYSTERECTOMY     APPENDECTOMY  1991   COLONOSCOPY N/A 06/19/2015   Procedure: COLONOSCOPY;  Surgeon: Claudis RAYMOND Rivet, MD;  [facility];  Service: Endoscopy;  Laterality: N/A;  8:30-9:30   COLONOSCOPY WITH PROPOFOL  N/A 09/20/2020   Procedure: COLONOSCOPY WITH PROPOFOL ;  Surgeon: Rivet Claudis RAYMOND, MD;  [facility]: [facility];  Service: Endoscopy;  Laterality: N/A;  1015   COLONOSCOPY WITH PROPOFOL  N/A 07/09/2023   Procedure: COLONOSCOPY WITH PROPOFOL ;  Surgeon: Eartha Angelia Sieving, MD;  [facility]: [facility];  Service: Gastroenterology;  Laterality: N/A;  8:45AM;ASA 2   cyst removed     right arm   ESOPHAGOGASTRODUODENOSCOPY (EGD) WITH PROPOFOL  N/A 07/09/2023   Procedure: ESOPHAGOGASTRODUODENOSCOPY (EGD)  WITH PROPOFOL ;  Surgeon: Eartha Angelia Sieving, MD;  [facility];  Service: Gastroenterology;  Laterality: N/A;  8:45AM;ASA 2   FLEXIBLE SIGMOIDOSCOPY  07/23/2011   Procedure: FLEXIBLE SIGMOIDOSCOPY;  Surgeon: Claudis RAYMOND Rivet, MD;  [facility]: [facility];  Service: Endoscopy;  Laterality: N/A;   HEMOSTASIS CLIP PLACEMENT  07/09/2023   Procedure: HEMOSTASIS CLIP PLACEMENT;  Surgeon: Eartha Angelia Sieving, MD;  [facility];  Service: Gastroenterology;;   HOT HEMOSTASIS  07/09/2023   Procedure: HOT HEMOSTASIS (ARGON PLASMA COAGULATION/BICAP);  Surgeon: Eartha Angelia, Sieving, MD;  [facility];  Service: Gastroenterology;;   POLYPECTOMY  07/09/2023   Procedure: POLYPECTOMY;  Surgeon: Eartha Angelia Sieving, MD;  [facility];  Service: Gastroenterology;;   TONSILLECTOMY AND ADENOIDECTOMY      SOCIAL HISTORY:  Social History   Socioeconomic History   Marital status: Married    Spouse name: Not on file   Number of children: Not on file   Years of education: Not on file   Highest education level: Not on file  Occupational History   Not on file  Tobacco Use   Smoking status: Never   Smokeless tobacco: Never  Vaping Use   Vaping status: Never Used  Substance and Sexual Activity   Alcohol use: Yes    Comment: social   Drug use: No   Sexual activity: Yes    Birth control/protection: Surgical  Other Topics Concern   Not on file  Social History Narrative   Not on file   Social Drivers of Health   Financial Resource Strain: Low Risk  (09/15/2023)   Overall Financial Resource Strain (CARDIA)    Difficulty of Paying Living Expenses: Not hard at all  Food Insecurity: No Food Insecurity (06/07/2024)   Hunger Vital Sign    Worried About Running Out of Food in the Last Year: Never true    Ran Out of Food in the Last Year: Never true  Transportation Needs: No Transportation Needs (06/07/2024)   PRAPARE - Scientist, research (physical sciences) (Medical): No    Lack of Transportation (Non-Medical): No  Physical Activity: Sufficiently Active (09/15/2023)   Exercise Vital Sign    Days of Exercise per Week: 7 days    Minutes of Exercise per Session: 30 min  Stress: No Stress Concern Present (09/15/2023)   [employer] of Occupational Health - Occupational Stress Questionnaire    Feeling of Stress : Not at all  Social Connections: Socially Integrated (09/15/2023)   Social Connection and Isolation Panel    Frequency of Communication with Friends and Family: More than three times a week    Frequency of Social Gatherings with Friends and Family: More than three times a week    Attends Religious Services: More than 4 times per year    Active Member of [employer] or Organizations: Yes    Attends Banker Meetings: 1 to 4 times per year    Marital Status: Married  Catering manager Violence: Not At Risk (06/07/2024)   Humiliation, Afraid, Rape, and Kick questionnaire    Fear of Current or Ex-Partner: No    Emotionally Abused: No    Physically Abused: No    Sexually Abused: No    FAMILY HISTORY:  Family History  Problem Relation Age of Onset   Breast cancer Mother 28   Atrial fibrillation Mother 31       s/p ablation   Breast cancer Maternal Grandmother 19   Colon cancer Maternal Aunt 80   Colon cancer Maternal Uncle 79    CURRENT MEDICATIONS:  Outpatient Encounter Medications as of 09/07/2024  Medication Sig   Artificial Tear Solution (SOOTHE XP OP) Place 1 drop into both eyes 3 (three) times daily as needed (dry/irritated eyes.).   cyanocobalamin  (VITAMIN B12) 1000 MCG/ML injection inject 1 milliliter (1000 microgram total) into the muscle every 30 days.   fexofenadine (ALLEGRA) 180 MG tablet Take 180 mg by mouth in the morning.   hyoscyamine  (LEVSIN  SL) 0.125 MG SL tablet place 1 tablet under the tongue 3 times daily as needed   ibuprofen (ADVIL) 200 MG tablet Take 400 mg by mouth every 8 (eight)  hours as needed (pain.).   levothyroxine  (SYNTHROID ) 100 MCG tablet Take 1 tablet (100 mcg total) by mouth daily before breakfast.   metFORMIN  (GLUCOPHAGE -XR) 750 MG 24 hr tablet Take 1 tablet (750 mg total) by mouth daily with breakfast.   omeprazole  (PRILOSEC) 20 MG capsule Take 1 capsule (20 mg total) by mouth 2 (two) times daily before a meal. (Patient taking differently: Take 20 mg by mouth daily.)   Tuberculin-Allergy Syringes (ULTICARE TUBERCULIN SAFETY SYR) 25G X 1 1 ML MISC  use to inject cyanocobalamin  monthly.   No facility-administered encounter medications on file as of 09/07/2024.    ALLERGIES:  Allergies  Allergen Reactions   Propoxyphene N-Acetaminophen  Hives and Palpitations    LABORATORY DATA:  I have reviewed the labs as listed.  CBC    Component Value Date/Time   WBC 5.8 08/30/2024 0853   RBC 5.00 08/30/2024 0853   HGB 15.2 (H) 08/30/2024 0853   HGB 12.6 05/25/2024 1102   HCT 45.0 08/30/2024 0853   HCT 40.4 05/25/2024 1102   PLT 96 (L) 08/30/2024 0853   PLT 137 (L) 05/25/2024 1102   MCV 90.0 08/30/2024 0853   MCV 82 05/25/2024 1102   MCH 30.4 08/30/2024 0853   MCHC 33.8 08/30/2024 0853   RDW 18.6 (H) 08/30/2024 0853   RDW 15.6 (H) 05/25/2024 1102   LYMPHSABS 2.4 08/30/2024 0853   MONOABS 0.6 08/30/2024 0853   EOSABS 0.2 08/30/2024 0853   BASOSABS 0.1 08/30/2024 0853      Latest Ref Rng & Units 09/17/2023   10:50 AM 11/12/2022   10:03 AM 08/07/2022   10:25 AM  CMP  Glucose 70 - 99 mg/dL 881  871  878   BUN 6 - 23 mg/dL 12  13  12    Creatinine 0.40 - 1.20 mg/dL 9.27  9.23  9.25   Sodium 135 - 145 mEq/L 138  141  138   Potassium 3.5 - 5.1 mEq/L 4.0  4.9  4.0   Chloride 96 - 112 mEq/L 105  107  106   CO2 19 - 32 mEq/L 24  26  23    Calcium  8.4 - 10.5 mg/dL 9.0  8.5  8.6   Total Protein 6.0 - 8.3 g/dL 6.3  6.3  6.9   Total Bilirubin 0.2 - 1.2 mg/dL 0.8  0.6  0.6   Alkaline Phos 39 - 117 U/L 106  91  85   AST 0 - 37 U/L 58  41  38   ALT 0 - 35 U/L 54   37  36     DIAGNOSTIC IMAGING:  I have independently reviewed the relevant imaging and discussed with the patient.   WRAP UP:  All questions were answered. The patient knows to call the clinic with any problems, questions or concerns.  Medical decision making: Moderate  Time spent on visit: I spent 20 minutes counseling the patient face to face. The total time spent in the appointment was 30 minutes and more than 50% was on counseling.  Pleasant Shelly [name] Barefoot, PA-C  09/07/24 10:43 AM

## 2024-09-07 ENCOUNTER — Inpatient Hospital Stay

## 2024-09-07 ENCOUNTER — Encounter (INDEPENDENT_AMBULATORY_CARE_PROVIDER_SITE_OTHER): Payer: Self-pay

## 2024-09-07 ENCOUNTER — Inpatient Hospital Stay: Admitting: Physician Assistant

## 2024-09-07 VITALS — BP 132/78 | HR 77 | Temp 98.4°F | Resp 18 | Wt 249.0 lb

## 2024-09-07 DIAGNOSIS — D696 Thrombocytopenia, unspecified: Secondary | ICD-10-CM | POA: Diagnosis not present

## 2024-09-07 DIAGNOSIS — D500 Iron deficiency anemia secondary to blood loss (chronic): Secondary | ICD-10-CM

## 2024-09-07 LAB — CBC WITH DIFFERENTIAL/PLATELET
Abs Immature Granulocytes: 0.02 K/uL (ref 0.00–0.07)
Basophils Absolute: 0.1 K/uL (ref 0.0–0.1)
Basophils Relative: 1 %
Eosinophils Absolute: 0.2 K/uL (ref 0.0–0.5)
Eosinophils Relative: 3 %
HCT: 45.3 % (ref 36.0–46.0)
Hemoglobin: 15.4 g/dL — ABNORMAL HIGH (ref 12.0–15.0)
Immature Granulocytes: 0 %
Lymphocytes Relative: 36 %
Lymphs Abs: 2.1 K/uL (ref 0.7–4.0)
MCH: 30.9 pg (ref 26.0–34.0)
MCHC: 34 g/dL (ref 30.0–36.0)
MCV: 90.8 fL (ref 80.0–100.0)
Monocytes Absolute: 0.7 K/uL (ref 0.1–1.0)
Monocytes Relative: 12 %
Neutro Abs: 2.9 K/uL (ref 1.7–7.7)
Neutrophils Relative %: 48 %
Platelets: 113 K/uL — ABNORMAL LOW (ref 150–400)
RBC: 4.99 MIL/uL (ref 3.87–5.11)
RDW: 17.3 % — ABNORMAL HIGH (ref 11.5–15.5)
WBC: 5.9 K/uL (ref 4.0–10.5)
nRBC: 0 % (ref 0.0–0.2)

## 2024-09-07 NOTE — Patient Instructions (Signed)
 [facility] at [facility] **VISIT SUMMARY & IMPORTANT INSTRUCTIONS **   You were seen today by Pleasant Barefoot PA-C for your follow-up visit.    IRON  DEFICIENCY You are not anemic, but your iron  levels remain low despite recent IV iron . This may be due to blood loss from nosebleeds and/or possible bleeding from your stomach or intestines. We will schedule you for another dose of IV iron . Will refer you to ENT for your nosebleeds.  LOW PLATELETS Your low platelets may be related to your fatty liver disease, but we will check additional test to rule out other causes. We will check labs today.  FOLLOW-UP APPOINTMENT: 4 months  ** Thank you for trusting me with your healthcare!  I strive to provide all of my patients with quality care at each visit.  If you receive a survey for this visit, I would be so grateful to you for taking the time to provide feedback.  Thank you in advance!  ~ Baljit Liebert                                        Dr. Mickiel Davonna Pleasant Barefoot, PA-C          Delon Hope, NP   - - - - - - - - - - - - - - - - - -    Thank you for choosing [facility] at [facility] to provide your oncology and hematology care.  To afford each patient quality time with our provider, please arrive at least 15 minutes before your scheduled appointment time.   If you have a lab appointment with the Cancer Center please come in thru the Main Entrance and check in at the main information desk.  You need to re-schedule your appointment should you arrive 10 or more minutes late.  We strive to give you quality time with our providers, and arriving late affects you and other patients whose appointments are after yours.  Also, if you no show three or more times for appointments you may be dismissed from the clinic at the providers discretion.     Again, thank you for choosing [facility].  Our hope is that these requests will  decrease the amount of time that you wait before being seen by our physicians.       _____________________________________________________________  Should you have questions after your visit to [facility], please contact our office at [phone] and follow the prompts.  Our office hours are 8:00 a.m. and 4:30 p.m. Monday - Friday.  Please note that voicemails left after 4:00 p.m. may not be returned until the following business day.  We are closed weekends and major holidays.  You do have access to a nurse 24-7, just call the main number to the clinic [phone] and do not press any options, hold on the line and a nurse will answer the phone.    For prescription refill requests, have your pharmacy contact our office and allow 72 hours.

## 2024-09-07 NOTE — Addendum Note (Signed)
 Addended by: LAMON HERTER on: 09/07/2024 10:55 AM   Modules accepted: Orders

## 2024-09-08 LAB — KAPPA/LAMBDA LIGHT CHAINS
Kappa free light chain: 27 mg/L — ABNORMAL HIGH (ref 3.3–19.4)
Kappa, lambda light chain ratio: 1.32 (ref 0.26–1.65)
Lambda free light chains: 20.5 mg/L (ref 5.7–26.3)

## 2024-09-08 LAB — RHEUMATOID FACTOR: Rheumatoid fact SerPl-aCnc: <10 [IU]/mL (ref <14.0)

## 2024-09-08 LAB — ANA: Anti Nuclear Antibody (ANA): POSITIVE — AB

## 2024-09-08 LAB — PLATELET ANTIBODY PROFILE
Glycoprotein IV Antibody: NEGATIVE
HLA Ab Ser Ql EIA: NEGATIVE
IB/IX Antibody: NEGATIVE
IIB/IIIA Antibody: NEGATIVE

## 2024-09-08 LAB — CYCLIC CITRUL PEPTIDE ANTIBODY, IGG/IGA: CCP Antibodies IgG/IgA: 13 U (ref 0–19)

## 2024-09-09 ENCOUNTER — Inpatient Hospital Stay: Attending: Physician Assistant

## 2024-09-09 VITALS — BP 128/58 | HR 71 | Temp 96.5°F | Resp 18

## 2024-09-09 DIAGNOSIS — D509 Iron deficiency anemia, unspecified: Secondary | ICD-10-CM | POA: Insufficient documentation

## 2024-09-09 DIAGNOSIS — D696 Thrombocytopenia, unspecified: Secondary | ICD-10-CM | POA: Insufficient documentation

## 2024-09-09 LAB — PROTEIN ELECTROPHORESIS, SERUM
A/G Ratio: 1.1 (ref 0.7–1.7)
Albumin ELP: 3.3 g/dL (ref 2.9–4.4)
Alpha-1-Globulin: 0.3 g/dL (ref 0.0–0.4)
Alpha-2-Globulin: 0.6 g/dL (ref 0.4–1.0)
Beta Globulin: 1.2 g/dL (ref 0.7–1.3)
Gamma Globulin: 0.9 g/dL (ref 0.4–1.8)
Globulin, Total: 3 g/dL (ref 2.2–3.9)
Total Protein ELP: 6.3 g/dL (ref 6.0–8.5)

## 2024-09-09 LAB — IMMUNOFIXATION ELECTROPHORESIS
IgA: 488 mg/dL — ABNORMAL HIGH (ref 87–352)
IgG (Immunoglobin G), Serum: 1005 mg/dL (ref 586–1602)
IgM (Immunoglobulin M), Srm: 142 mg/dL (ref 26–217)
Total Protein ELP: 6.5 g/dL (ref 6.0–8.5)

## 2024-09-09 LAB — SURGICAL PATHOLOGY

## 2024-09-09 MED ORDER — ACETAMINOPHEN 325 MG PO TABS
650.0000 mg | ORAL_TABLET | Freq: Once | ORAL | Status: AC
  Administered 2024-09-09: 650 mg via ORAL
  Filled 2024-09-09: qty 2

## 2024-09-09 MED ORDER — SODIUM CHLORIDE 0.9 % IV SOLN: Freq: Once | INTRAVENOUS | Status: AC

## 2024-09-09 MED ORDER — METHYLPREDNISOLONE SODIUM SUCC 125 MG IJ SOLR
125.0000 mg | Freq: Once | INTRAMUSCULAR | Status: AC
  Administered 2024-09-09: 125 mg via INTRAVENOUS
  Filled 2024-09-09: qty 2

## 2024-09-09 MED ORDER — SODIUM CHLORIDE 0.9 % IV SOLN
1000.0000 mg | Freq: Once | INTRAVENOUS | Status: AC
  Administered 2024-09-09: 1000 mg via INTRAVENOUS
  Filled 2024-09-09: qty 20

## 2024-09-09 MED ORDER — FAMOTIDINE IN NACL 20-0.9 MG/50ML-% IV SOLN
20.0000 mg | Freq: Once | INTRAVENOUS | Status: AC
  Administered 2024-09-09: 20 mg via INTRAVENOUS
  Filled 2024-09-09: qty 50

## 2024-09-09 MED ORDER — CETIRIZINE HCL 10 MG/ML IV SOLN
10.0000 mg | Freq: Once | INTRAVENOUS | Status: AC
  Administered 2024-09-09: 10 mg via INTRAVENOUS
  Filled 2024-09-09: qty 1

## 2024-09-09 NOTE — Progress Notes (Signed)
 Patient tolerated iron  infusion with no complaints voiced.  Peripheral IV site clean and dry with good blood return noted before and after infusion.  Pt observed for 30 minutes post iron  infusion without any complications. VSS with discharge and left in satisfactory condition with no s/s of distress noted. All follow ups as scheduled.  Adamariz Gillott

## 2024-09-09 NOTE — Patient Instructions (Signed)
 Iron Dextran Injection What is this medication? IRON DEXTRAN (EYE ern DEX tran) treats low levels of iron in your body. Iron is a mineral that plays an important role in making red blood cells, which carry oxygen from your lungs to the rest of your body. This medicine may be used for other purposes; ask your health care provider or pharmacist if you have questions. COMMON BRAND NAME(S): Dexferrum, INFeD What should I tell my care team before I take this medication? They need to know if you have any of these conditions: Anemia not caused by low iron levels Heart disease High levels of iron in the blood Kidney disease Liver disease An unusual or allergic reaction to iron, other medications, foods, dyes, or preservatives Pregnant or trying to get pregnant Breastfeeding How should I use this medication? This medication is injected into a vein or a muscle. It is given by your care team in a hospital or clinic setting. Talk to your care team about the use of this medication in children. While it may be prescribed for children as young as 4 months for selected conditions, precautions do apply. Overdosage: If you think you have taken too much of this medicine contact a poison control center or emergency room at once. NOTE: This medicine is only for you. Do not share this medicine with others. What if I miss a dose? Keep appointments for follow-up doses. It is important not to miss your dose. Call your care team if you are unable to keep an appointment. What may interact with this medication? Do not take this medication with any of the following: Deferoxamine Dimercaprol Other iron products This medication may also interact with the following: Chloramphenicol Deferasirox This list may not describe all possible interactions. Give your health care provider a list of all the medicines, herbs, non-prescription drugs, or dietary supplements you use. Also tell them if you smoke, drink alcohol, or use  illegal drugs. Some items may interact with your medicine. What should I watch for while using this medication? Visit your care team for regular checks on your progress. Tell your care team if your symptoms do not start to get better or if they get worse. You may need blood work while taking this medication. You may need to eat more foods that contain iron. Talk to your care team. Foods that contain iron include whole grains/cereals, dried fruits, beans, peas, leafy green vegetables, and organ meats (liver, kidney). Long-term use of this medication may increase your risk of some cancers. Talk to your care team about your risk of cancer. What side effects may I notice from receiving this medication? Side effects that you should report to your care team as soon as possible: Allergic reactions--skin rash, itching, hives, swelling of the face, lips, tongue, or throat Low blood pressure--dizziness, feeling faint or lightheaded, blurry vision Shortness of breath Side effects that usually do not require medical attention (report to your care team if they continue or are bothersome): Flushing Headache Joint pain Muscle pain Nausea Pain, redness, or irritation at injection site This list may not describe all possible side effects. Call your doctor for medical advice about side effects. You may report side effects to FDA at 1-800-FDA-1088. Where should I keep my medication? This medication is given in a hospital or clinic. It will not be stored at home. NOTE: This sheet is a summary. It may not cover all possible information. If you have questions about this medicine, talk to your doctor, pharmacist, or health  care provider.  2024 Elsevier/Gold Standard (2023-07-16 00:00:00)

## 2024-09-13 LAB — FLOW CYTOMETRY

## 2024-09-16 ENCOUNTER — Ambulatory Visit: Payer: Self-pay | Admitting: Physician Assistant

## 2024-09-16 NOTE — Progress Notes (Signed)
 I have reviewed labs obtained on 09/07/2024 for workup of thrombocytopenia: - (Prior testing showed normal B12, folate, copper , MMA with hepatitis B/C- and low-normal immature platelet fraction 2.8%) - Flow cytometry was normal - Platelet antibodies negative - ANA positive.  Rheumatoid factor and anti-CCP negative. - SPEP negative.  Immunofixation with polyclonal increase in immunoglobulins.  FLC ratio normal.  MyChart message sent to patient with explanation of these results. No changes to plan at this time.  We will discuss in more detail at follow-up visit in 4 months, and we will consider repeat liver imaging and/or bone marrow biopsy if she has persistent thrombocytopenia without obvious explanation.  At present, differential diagnosis favors thrombocytopenia secondary to fatty liver disease versus mild chronic ITP versus early MDS.  [name], PA-C 09/16/24 11:34 AM

## 2024-09-20 ENCOUNTER — Ambulatory Visit: Payer: Medicare PPO

## 2024-09-20 VITALS — Ht 68.0 in | Wt 245.0 lb

## 2024-09-20 DIAGNOSIS — Z1231 Encounter for screening mammogram for malignant neoplasm of breast: Secondary | ICD-10-CM | POA: Diagnosis not present

## 2024-09-20 DIAGNOSIS — Z0000 Encounter for general adult medical examination without abnormal findings: Secondary | ICD-10-CM | POA: Diagnosis not present

## 2024-09-20 NOTE — Progress Notes (Signed)
 Subjective:   Shelly [name] is a 68 y.o. who presents for a Medicare Wellness preventive visit.  As a reminder, Annual Wellness Visits don't include a physical exam, and some assessments may be limited, especially if this visit is performed virtually. We may recommend an in-person follow-up visit with your provider if needed.  Visit Complete: Virtual I connected with  Shelly [name] on 09/20/24 by a video and audio enabled telemedicine application and verified that I am speaking with the correct person using two identifiers.  Patient Location: Home  Provider Location: Office/Clinic  I discussed the limitations of evaluation and management by telemedicine. The patient expressed understanding and agreed to proceed.  Vital Signs: Because this visit was a virtual/telehealth visit, some criteria may be missing or patient reported. Any vitals not documented were not able to be obtained and vitals that have been documented are patient reported.    Persons Participating in Visit: Patient.  AWV Questionnaire: Yes: Patient Medicare AWV questionnaire was completed by the patient on 09/19/24; I have confirmed that all information answered by patient is correct and no changes since this date.  Cardiac Risk Factors include: diabetes mellitus;advanced age (>79men, >103 women);obesity (BMI >30kg/m2)     Objective:    Today's Vitals   09/20/24 1035  Weight: 245 lb (111.1 kg)  Height: 5' 8 (1.727 m)   Body mass index is 37.25 kg/m.     09/20/2024   10:39 AM 09/07/2024   10:06 AM 06/07/2024    8:20 AM 09/15/2023   11:36 AM 07/09/2023   11:39 AM 09/09/2022   11:11 AM 08/23/2021    9:37 AM  Advanced Directives  Does Patient Have a Medical Advance Directive? No No No No No No No  Would patient like information on creating a medical advance directive? No - Patient declined No - Patient declined No - Patient declined No - Patient declined No - Patient declined Yes  (MAU/Ambulatory/Procedural Areas - Information given) Yes (MAU/Ambulatory/Procedural Areas - Information given)    Current Medications (verified) Outpatient Encounter Medications as of 09/20/2024  Medication Sig   Artificial Tear Solution (SOOTHE XP OP) Place 1 drop into both eyes 3 (three) times daily as needed (dry/irritated eyes.).   cyanocobalamin  (VITAMIN B12) 1000 MCG/ML injection inject 1 milliliter (1000 microgram total) into the muscle every 30 days.   fexofenadine (ALLEGRA) 180 MG tablet Take 180 mg by mouth in the morning.   hyoscyamine  (LEVSIN  SL) 0.125 MG SL tablet place 1 tablet under the tongue 3 times daily as needed   ibuprofen (ADVIL) 200 MG tablet Take 400 mg by mouth every 8 (eight) hours as needed (pain.).   levothyroxine  (SYNTHROID ) 100 MCG tablet Take 1 tablet (100 mcg total) by mouth daily before breakfast.   metFORMIN  (GLUCOPHAGE -XR) 750 MG 24 hr tablet Take 1 tablet (750 mg total) by mouth daily with breakfast.   omeprazole  (PRILOSEC) 20 MG capsule Take 1 capsule (20 mg total) by mouth 2 (two) times daily before a meal.   Tuberculin-Allergy Syringes ([employer]ICARE TUBERCULIN SAFETY SYR) 25G X 1 1 ML MISC use to inject cyanocobalamin  monthly.   No facility-administered encounter medications on file as of 09/20/2024.    Allergies (verified) Propoxyphene n-acetaminophen    History: Past Medical History:  Diagnosis Date   Cancer (HCC)    Basal Cell    Diabetes mellitus without complication (HCC)    GERD (gastroesophageal reflux disease)    Headache(784.0)    Hypothyroidism    Irritable bowel syndrome (IBS)  Primary osteoarthritis of left knee 01/05/2019   Thrombopenia 01/07/2019   Chronic by lab review   Past Surgical History:  Procedure Laterality Date   ABDOMINAL HYSTERECTOMY     APPENDECTOMY  1991   COLONOSCOPY N/A 06/19/2015   Procedure: COLONOSCOPY;  Surgeon: Claudis RAYMOND Rivet, MD;  [facility]: [facility];  Service: Endoscopy;  Laterality: N/A;  8:30-9:30    COLONOSCOPY WITH PROPOFOL  N/A 09/20/2020   Procedure: COLONOSCOPY WITH PROPOFOL ;  Surgeon: Rivet Claudis RAYMOND, MD;  Location: [facility];  Service: Endoscopy;  Laterality: N/A;  1015   COLONOSCOPY WITH PROPOFOL  N/A 07/09/2023   Procedure: COLONOSCOPY WITH PROPOFOL ;  Surgeon: Eartha Angelia Sieving, MD;  Location: [facility];  Service: Gastroenterology;  Laterality: N/A;  8:45AM;ASA 2   cyst removed     right arm   ESOPHAGOGASTRODUODENOSCOPY (EGD) WITH PROPOFOL  N/A 07/09/2023   Procedure: ESOPHAGOGASTRODUODENOSCOPY (EGD) WITH PROPOFOL ;  Surgeon: Eartha Angelia Sieving, MD;  Location: [facility];  Service: Gastroenterology;  Laterality: N/A;  8:45AM;ASA 2   FLEXIBLE SIGMOIDOSCOPY  07/23/2011   Procedure: FLEXIBLE SIGMOIDOSCOPY;  Surgeon: Claudis RAYMOND Rivet, MD;  Location: [facility];  Service: Endoscopy;  Laterality: N/A;   HEMOSTASIS CLIP PLACEMENT  07/09/2023   Procedure: HEMOSTASIS CLIP PLACEMENT;  Surgeon: Eartha Angelia Sieving, MD;  Location: [facility];  Service: Gastroenterology;;   HOT HEMOSTASIS  07/09/2023   Procedure: HOT HEMOSTASIS (ARGON PLASMA COAGULATION/BICAP);  Surgeon: Eartha Angelia, Sieving, MD;  Location: [facility];  Service: Gastroenterology;;   POLYPECTOMY  07/09/2023   Procedure: POLYPECTOMY;  Surgeon: Eartha Angelia, Sieving, MD;  [facility]: [facility];  Service: Gastroenterology;;   TONSILLECTOMY AND ADENOIDECTOMY     Family History  Problem Relation Age of Onset   Breast cancer Mother 73   Atrial fibrillation Mother 63       s/p ablation   Breast cancer Maternal Grandmother 86   Colon cancer Maternal Aunt 80   Colon cancer Maternal Uncle 25   Social History   Socioeconomic History   Marital status: Married    Spouse name: Not on file   Number of children: Not on file   Years of education: Not on file   Highest education level: Bachelor's degree (e.g., BA, AB, BS)  Occupational History   Not on file  Tobacco Use   Smoking  status: Never   Smokeless tobacco: Never  Vaping Use   Vaping status: Never Used  Substance and Sexual Activity   Alcohol use: Not Currently    Comment: social   Drug use: No   Sexual activity: Yes    Birth control/protection: Surgical  Other Topics Concern   Not on file  Social History Narrative   Not on file   Social Drivers of Health   Financial Resource Strain: Low Risk  (09/19/2024)   Overall Financial Resource Strain (CARDIA)    Difficulty of Paying Living Expenses: Not hard at all  Food Insecurity: No Food Insecurity (09/19/2024)   Hunger Vital Sign    Worried About Running Out of Food in the Last Year: Never true    Ran Out of Food in the Last Year: Never true  Transportation Needs: No Transportation Needs (09/19/2024)   PRAPARE - Administrator, Civil Service (Medical): No    Lack of Transportation (Non-Medical): No  Physical Activity: Insufficiently Active (09/19/2024)   Exercise Vital Sign    Days of Exercise per Week: 2 days    Minutes of Exercise per Session: 30  min  Stress: Stress Concern Present (09/19/2024)   [employer] of Occupational Health - Occupational Stress Questionnaire    Feeling of Stress: To some extent  Social Connections: Socially Integrated (09/19/2024)   Social Connection and Isolation Panel    Frequency of Communication with Friends and Family: More than three times a week    Frequency of Social Gatherings with Friends and Family: More than three times a week    Attends Religious Services: More than 4 times per year    Active Member of [employer] or Organizations: Yes    Attends Engineer, structural: More than 4 times per year    Marital Status: Married    Tobacco Counseling Counseling given: Not Answered    Clinical Intake:  Pre-visit preparation completed: Yes  Pain : No/denies pain     BMI - recorded: 37.25 Nutritional Status: BMI > 30  Obese Diabetes: Yes CBG done?: No Did pt. bring in CBG monitor  from home?: No  Lab Results  Component Value Date   HGBA1C 6.4 (A) 03/17/2024   HGBA1C 6.5 09/17/2023   HGBA1C 6.5 (A) 02/17/2023     How often do you need to have someone help you when you read instructions, pamphlets, or other written materials from your doctor or pharmacy?: 1 - Never  Interpreter Needed?: No  Information entered by :: Ellouise Haws, LPN   Activities of Daily Living     09/19/2024    6:29 PM  In your present state of health, do you have any difficulty performing the following activities:  Hearing? 0  Vision? 0  Difficulty concentrating or making decisions? 0  Walking or climbing stairs? 0  Dressing or bathing? 0  Doing errands, shopping? 0  Preparing Food and eating ? N  Using the Toilet? N  In the past six months, have you accidently leaked urine? Y  Comment wears a pad  Do you have problems with loss of bowel control? N  Managing your Medications? N  Managing your Finances? N  Housekeeping or managing your Housekeeping? N    Patient Care Team: Jodie Lavern CROME, MD as PCP - General (Family Medicine) Golda Claudis PENNER, MD (Inactive) (Gastroenterology) Ivin Kocher, MD (Dermatology) Elsa Lonni SAUNDERS, MD as Consulting Physician (Orthopedic Surgery) Dr. Nivia as Consulting Physician (Ophthalmology)  I have updated your Care Teams any recent Medical Services you may have received from other providers in the past year.     Assessment:   This is a routine wellness examination for Shelly [name].  Hearing/Vision screen Hearing Screening - Comments:: Pt denies any hearing issues  Vision Screening - Comments:: Wears rx glasses - up to date with routine eye exams with may be switching to Dr Octavia for further eye care was going to [facility]    Goals Addressed   None    Depression Screen     09/20/2024   10:38 AM 09/07/2024   10:05 AM 06/21/2024    8:27 AM 06/07/2024    8:17 AM 03/17/2024   10:43 AM 09/17/2023   10:18 AM 09/15/2023   11:39 AM  PHQ  2/9 Scores  PHQ - 2 Score 0 0 0 0 0 0 0  PHQ- 9 Score      1     Fall Risk     09/19/2024    6:29 PM 03/17/2024   10:43 AM 09/17/2023   10:18 AM 09/15/2023   11:36 AM 02/17/2023    9:37 AM  Fall Risk   Falls  in the past year? 0 0 0 0 0  Number falls in past yr: 0 0 0 0 0  Injury with Fall? 0 0 0 0 0  Risk for fall due to :  No Fall Risks No Fall Risks Impaired vision No Fall Risks  Follow up  Falls evaluation completed Falls evaluation completed Falls prevention discussed Falls evaluation completed    MEDICARE RISK AT HOME:  Medicare Risk at Home Any stairs in or around the home?: (Patient-Rptd) Yes If so, are there any without handrails?: (Patient-Rptd) No Home free of loose throw rugs in walkways, pet beds, electrical cords, etc?: (Patient-Rptd) Yes Adequate lighting in your home to reduce risk of falls?: (Patient-Rptd) Yes Life alert?: (Patient-Rptd) No Use of a cane, walker or w/c?: (Patient-Rptd) No Grab bars in the bathroom?: (Patient-Rptd) No Shower chair or bench in shower?: (Patient-Rptd) No Elevated toilet seat or a handicapped toilet?: (Patient-Rptd) Yes  TIMED UP AND GO:  Was the test performed?  No  Cognitive Function: 6CIT completed        09/20/2024   10:39 AM 09/15/2023   11:58 AM 09/09/2022   11:14 AM 08/23/2021    9:40 AM  6CIT Screen  What Year? 0 points 0 points 0 points 0 points  What month? 0 points 0 points 0 points 0 points  What time? 0 points 0 points 0 points 0 points  Count back from 20 0 points 0 points 0 points 0 points  Months in reverse 0 points 0 points 0 points 0 points  Repeat phrase 0 points 0 points 0 points 0 points  Total Score 0 points 0 points 0 points 0 points    Immunizations Immunization History  Administered Date(s) Administered   Fluad Quad(high Dose 65+) 10/02/2021, 10/07/2022   Influenza Whole 10/09/2010   Influenza,inj,Quad PF,6+ Mos 09/20/2014, 09/29/2017, 10/05/2018, 09/15/2019   Influenza-Unspecified 10/02/2016,  09/29/2017, 08/18/2023   Moderna Covid-19 Vaccine Bivalent Booster 68yrs & up 09/18/2021   Moderna SARS-COV2 Booster Vaccination 10/07/2022   Moderna Sars-Covid-2 Vaccination 12/30/2019, 02/04/2020, 10/17/2020   PNEUMOCOCCAL CONJUGATE-20 03/17/2024   Pneumococcal Conjugate-13 09/15/2019   Pneumococcal Polysaccharide-23 01/16/2018   Td 12/09/1998, 01/07/2011   Tdap 09/20/2014   Unspecified SARS-COV-2 Vaccination 08/18/2023   Varicella Zoster Immune Globulin 10/07/2014   Zoster Recombinant(Shingrix ) 01/16/2018, 10/05/2018   Zoster, Live 09/08/2014    Screening Tests Health Maintenance  Topic Date Due   COVID-19 Vaccine (6 - 2025-26 season) 08/09/2024   Diabetic kidney evaluation - eGFR measurement  09/16/2024   DTaP/Tdap/Td (4 - Td or Tdap) 09/20/2024   FOOT EXAM  09/16/2024   HEMOGLOBIN A1C  09/16/2024   Mammogram  10/09/2024   Influenza Vaccine  11/04/2024 (Originally 07/09/2024)   Diabetic kidney evaluation - Urine ACR  03/17/2025   OPHTHALMOLOGY EXAM  05/11/2025   DEXA SCAN  08/29/2025   Medicare Annual Wellness (AWV)  09/20/2025   Colonoscopy  07/08/2030   Pneumococcal Vaccine: 50+ Years  Completed   Hepatitis C Screening  Completed   Zoster Vaccines- Shingrix   Completed   Meningococcal B Vaccine  Aged Out    Health Maintenance Items Addressed: Vaccines Due: discussed, Diabetic Foot Exam recommended  Additional Screening:  Vision Screening: Recommended annual ophthalmology exams for early detection of glaucoma and other disorders of the eye. Is the patient up to date with their annual eye exam?  Yes  Who is the provider or what is the name of the office in which the patient attends annual eye exams? [facility] will  be switching to Dr Octavia for further eye care   Dental Screening: Recommended annual dental exams for proper oral hygiene  Community Resource Referral / Chronic Care Management: CRR required this visit?  No   CCM required this visit?  No   Plan:    I  have personally reviewed and noted the following in the patient's chart:   Medical and social history Use of alcohol, tobacco or illicit drugs  Current medications and supplements including opioid prescriptions. Patient is not currently taking opioid prescriptions. Functional ability and status Nutritional status Physical activity Advanced directives List of other physicians Hospitalizations, surgeries, and ER visits in previous 12 months Vitals Screenings to include cognitive, depression, and falls Referrals and appointments  In addition, I have reviewed and discussed with patient certain preventive protocols, quality metrics, and best practice recommendations. A written personalized care plan for preventive services as well as general preventive health recommendations were provided to patient.   Ellouise VEAR Haws, LPN   [id number]   After Visit Summary: (MyChart) Due to this being a telephonic visit, the after visit summary with patients personalized plan was offered to patient via MyChart   Notes: Nothing significant to report at this time.

## 2024-09-20 NOTE — Patient Instructions (Signed)
 Shelly [name],  Thank you for taking the time for your Medicare Wellness Visit. I appreciate your continued commitment to your health goals. Please review the care plan we discussed, and feel free to reach out if I can assist you further.  Medicare recommends these wellness visits once per year to help you and your care team stay ahead of potential health issues. These visits are designed to focus on prevention, allowing your provider to concentrate on managing your acute and chronic conditions during your regular appointments.  Please note that Annual Wellness Visits do not include a physical exam. Some assessments may be limited, especially if the visit was conducted virtually. If needed, we may recommend a separate in-person follow-up with your provider.  Ongoing Care Seeing your primary care provider every 3 to 6 months helps us  monitor your health and provide consistent, personalized care.   Referrals If a referral was made during today's visit and you haven't received any updates within two weeks, please contact the referred provider directly to check on the status.  Recommended Screenings:  Health Maintenance  Topic Date Due   COVID-19 Vaccine (6 - 2025-26 season) 08/09/2024   Medicare Annual Wellness Visit  09/14/2024   Yearly kidney function blood test for diabetes  09/16/2024   DTaP/Tdap/Td vaccine (4 - Td or Tdap) 09/20/2024   Complete foot exam   09/16/2024   Hemoglobin A1C  09/16/2024   Breast Cancer Screening  10/09/2024   Flu Shot  11/04/2024*   Yearly kidney health urinalysis for diabetes  03/17/2025   Eye exam for diabetics  05/11/2025   DEXA scan (bone density measurement)  08/29/2025   Colon Cancer Screening  07/08/2030   Pneumococcal Vaccine for age over 84  Completed   Hepatitis C Screening  Completed   Zoster (Shingles) Vaccine  Completed   Meningitis B Vaccine  Aged Out  *Topic was postponed. The date shown is not the original due date.       09/07/2024    10:06 AM  Advanced Directives  Does Patient Have a Medical Advance Directive? No  Would patient like information on creating a medical advance directive? No - Patient declined   Advance Care Planning is important because it: Ensures you receive medical care that aligns with your values, goals, and preferences. Provides guidance to your family and loved ones, reducing the emotional burden of decision-making during critical moments.  Vision: Annual vision screenings are recommended for early detection of glaucoma, cataracts, and diabetic retinopathy. These exams can also reveal signs of chronic conditions such as diabetes and high blood pressure.  Dental: Annual dental screenings help detect early signs of oral cancer, gum disease, and other conditions linked to overall health, including heart disease and diabetes.  Please see the attached documents for additional preventive care recommendations.

## 2024-09-22 ENCOUNTER — Encounter (INDEPENDENT_AMBULATORY_CARE_PROVIDER_SITE_OTHER): Payer: Self-pay | Admitting: Gastroenterology

## 2024-09-24 ENCOUNTER — Ambulatory Visit: Admitting: Family Medicine

## 2024-09-24 ENCOUNTER — Encounter: Payer: Self-pay | Admitting: Family Medicine

## 2024-09-24 VITALS — BP 136/78 | HR 68 | Temp 97.7°F | Ht 68.0 in | Wt 249.0 lb

## 2024-09-24 DIAGNOSIS — Z800 Family history of malignant neoplasm of digestive organs: Secondary | ICD-10-CM

## 2024-09-24 DIAGNOSIS — Z0000 Encounter for general adult medical examination without abnormal findings: Secondary | ICD-10-CM

## 2024-09-24 DIAGNOSIS — E039 Hypothyroidism, unspecified: Secondary | ICD-10-CM

## 2024-09-24 DIAGNOSIS — E538 Deficiency of other specified B group vitamins: Secondary | ICD-10-CM | POA: Diagnosis not present

## 2024-09-24 DIAGNOSIS — K760 Fatty (change of) liver, not elsewhere classified: Secondary | ICD-10-CM | POA: Diagnosis not present

## 2024-09-24 DIAGNOSIS — R030 Elevated blood-pressure reading, without diagnosis of hypertension: Secondary | ICD-10-CM

## 2024-09-24 DIAGNOSIS — R7989 Other specified abnormal findings of blood chemistry: Secondary | ICD-10-CM | POA: Diagnosis not present

## 2024-09-24 DIAGNOSIS — D126 Benign neoplasm of colon, unspecified: Secondary | ICD-10-CM

## 2024-09-24 DIAGNOSIS — E119 Type 2 diabetes mellitus without complications: Secondary | ICD-10-CM | POA: Diagnosis not present

## 2024-09-24 DIAGNOSIS — Z6835 Body mass index (BMI) 35.0-35.9, adult: Secondary | ICD-10-CM

## 2024-09-24 DIAGNOSIS — N3281 Overactive bladder: Secondary | ICD-10-CM | POA: Diagnosis not present

## 2024-09-24 DIAGNOSIS — N3946 Mixed incontinence: Secondary | ICD-10-CM | POA: Diagnosis not present

## 2024-09-24 DIAGNOSIS — T466X5A Adverse effect of antihyperlipidemic and antiarteriosclerotic drugs, initial encounter: Secondary | ICD-10-CM

## 2024-09-24 DIAGNOSIS — Z7984 Long term (current) use of oral hypoglycemic drugs: Secondary | ICD-10-CM

## 2024-09-24 DIAGNOSIS — Z0001 Encounter for general adult medical examination with abnormal findings: Secondary | ICD-10-CM

## 2024-09-24 LAB — LIPID PANEL
Cholesterol: 179 mg/dL (ref 0–200)
HDL: 56.8 mg/dL (ref >39.00)
LDL Cholesterol: 93 mg/dL (ref 0–99)
NonHDL: 122.03
Total CHOL/HDL Ratio: 3
Triglycerides: 144 mg/dL (ref 0.0–149.0)
VLDL: 28.8 mg/dL (ref 0.0–40.0)

## 2024-09-24 LAB — COMPREHENSIVE METABOLIC PANEL WITH GFR
ALT: 128 U/L — ABNORMAL HIGH (ref 0–35)
AST: 98 U/L — ABNORMAL HIGH (ref 0–37)
Albumin: 3.9 g/dL (ref 3.5–5.2)
Alkaline Phosphatase: 181 U/L — ABNORMAL HIGH (ref 39–117)
BUN: 9 mg/dL (ref 6–23)
CO2: 27 meq/L (ref 19–32)
Calcium: 9.2 mg/dL (ref 8.4–10.5)
Chloride: 102 meq/L (ref 96–112)
Creatinine, Ser: 0.69 mg/dL (ref 0.40–1.20)
GFR: 87.76 mL/min (ref >60.00)
Glucose, Bld: 217 mg/dL — ABNORMAL HIGH (ref 70–99)
Potassium: 4.6 meq/L (ref 3.5–5.1)
Sodium: 138 meq/L (ref 135–145)
Total Bilirubin: 1.7 mg/dL — ABNORMAL HIGH (ref 0.2–1.2)
Total Protein: 6.8 g/dL (ref 6.0–8.3)

## 2024-09-24 LAB — CBC WITH DIFFERENTIAL/PLATELET
Basophils Absolute: 0 K/uL (ref 0.0–0.1)
Basophils Relative: 0.9 % (ref 0.0–3.0)
Eosinophils Absolute: 0.1 K/uL (ref 0.0–0.7)
Eosinophils Relative: 2.7 % (ref 0.0–5.0)
HCT: 45.5 % (ref 36.0–46.0)
Hemoglobin: 15.3 g/dL — ABNORMAL HIGH (ref 12.0–15.0)
Lymphocytes Relative: 35.6 % (ref 12.0–46.0)
Lymphs Abs: 1.8 K/uL (ref 0.7–4.0)
MCHC: 33.6 g/dL (ref 30.0–36.0)
MCV: 94.1 fl (ref 78.0–100.0)
Monocytes Absolute: 0.5 K/uL (ref 0.1–1.0)
Monocytes Relative: 9.6 % (ref 3.0–12.0)
Neutro Abs: 2.6 K/uL (ref 1.4–7.7)
Neutrophils Relative %: 51.2 % (ref 43.0–77.0)
Platelets: 99 K/uL — ABNORMAL LOW (ref 150.0–400.0)
RBC: 4.83 Mil/uL (ref 3.87–5.11)
RDW: 17.2 % — ABNORMAL HIGH (ref 11.5–15.5)
WBC: 5.2 K/uL (ref 4.0–10.5)

## 2024-09-24 LAB — HEMOGLOBIN A1C: Hgb A1c MFr Bld: 8.9 % — ABNORMAL HIGH (ref 4.6–6.5)

## 2024-09-24 LAB — VITAMIN D 25 HYDROXY (VIT D DEFICIENCY, FRACTURES): VITD: 21.45 ng/mL — ABNORMAL LOW (ref 30.00–100.00)

## 2024-09-24 LAB — VITAMIN B12: Vitamin B-12: 568 pg/mL (ref 211–911)

## 2024-09-24 LAB — TSH: TSH: 3.38 u[IU]/mL (ref 0.35–5.50)

## 2024-09-24 MED ORDER — FLUTICASONE PROPIONATE 50 MCG/ACT NA SUSP: 1.0000 | Freq: Every day | NASAL | 6 refills | Status: AC

## 2024-09-24 MED ORDER — SOLIFENACIN SUCCINATE 10 MG PO TABS: 10.0000 mg | ORAL_TABLET | Freq: Every day | ORAL | 3 refills | Status: AC

## 2024-09-27 ENCOUNTER — Ambulatory Visit: Payer: Self-pay | Admitting: Family Medicine

## 2024-09-27 NOTE — Progress Notes (Signed)
 Please call patient: lab results show that her diabetes is now very uncontrolled: sugar was 217 and A1c is up to 8.9.  I recommend scheduling an appointment so we can discuss improving diabetic management with medications and diet. Vit D is low as well. Can take vit D otc 2000 units daily. Also, her liver enzymes are elevated.  The hematologist is working up her low platelets. Please ask her to schedule a visit with me within the next few weeks so I can reassess these problems.

## 2024-09-27 NOTE — Progress Notes (Signed)
 Subjective  Chief Complaint  Patient presents with   Annual Exam    Pt here for Annual Exam and is currently fasting    Diabetes    HPI: Shelly [name] is a 68 y.o. female who presents to [facility] [place] today for a Female Wellness Visit. She also has the concerns and/or needs as listed above in the chief complaint. These will be addressed in addition to the Health Maintenance Visit.   Wellness Visit: annual visit with health maintenance review and exam  HM: mammo scheduled. CRC screen current. Dexa up to date. Imms up to date  Chronic disease f/u and/or acute problem visit: (deemed necessary to be done in addition to the wellness visit): Discussed the use of AI scribe software for clinical note transcription with the patient, who gave verbal consent to proceed.  History of Present Illness Shelly [name] is a 68 year old female with hypertension and diabetes who presents with concerns about incontinence and a persistent cough.  Urinary incontinence - Increased urinary incontinence with both urgency and stress components - Sudden urge to urinate, requiring immediate access to a bathroom - Incontinence occurs with coughing, bending over, or jumping - No dysuria or hematuria - Nocturia present, with awakening every night to urinate  Cough and upper respiratory symptoms - Persistent cough that fluctuates in severity - Cough improved earlier in the week but worsened after prolonged outdoor activity - Daily use of Allegra for allergies and a nasal spray - Allergic symptoms include postnasal drainage and morning throat clearing  Cutaneous lesion - New skin lesion on the face with concern for possible skin cancer - Currently under the care of a physician for this lesion  Chest discomfort - Chest discomfort began last week after lifting a heavy pot of soup - Pain is movement-associated and varies in intensity day to day - at right  costochondral junction. No substernal pain or exertional sxs   Hypertension and stress management - Blood pressure not regularly monitored at home - Attempts to manage stress by turning off the TV and spending time outdoors when upset    Assessment  1. Encounter for well adult exam with abnormal findings   2. Type 2 diabetes mellitus treated without insulin (HCC)   3. Acquired hypothyroidism   4. Low serum low density lipoprotein (LDL)   5. Severe obesity (BMI 35.0-35.9 with comorbidity) (HCC)   6. White coat syndrome without diagnosis of hypertension   7. Family history of colon cancer   8. Fatty liver   9. Vitamin B12 deficiency   10. Myalgia due to statin   11. Tubular adenoma of colon   12. Overactive bladder   13. Mixed stress and urge urinary incontinence      Plan  Female Wellness Visit: Age appropriate Health Maintenance and Prevention measures were discussed with patient. Included topics are cancer screening recommendations, ways to keep healthy (see AVS) including dietary and exercise recommendations, regular eye and dental care, use of seat belts, and avoidance of moderate alcohol use and tobacco use.  BMI: discussed patient's BMI and encouraged positive lifestyle modifications to help get to or maintain a target BMI. HM needs and immunizations were addressed and ordered. See below for orders. See HM and immunization section for updates. Routine labs and screening tests ordered including cmp, cbc and lipids where appropriate. Discussed recommendations regarding Vit D and calcium  supplementation (see AVS)  Chronic disease management visit and/or acute problem visit: Assessment and  Plan Assessment & Plan Mixed urinary incontinence (stress and urge) Increased urgency and stress incontinence with urgency without infection signs and stress incontinence with coughing or bending. Decreased estrogen levels and overactive bladder discussed as contributing factors. Mental state  impact on bladder function explained. Medication options for overactive bladder discussed, starting with generics due to insurance coverage. - Prescribe medication for overactive bladder, vesicare - Instruct on performing Kegel exercises to strengthen pelvic floor muscles - Advise on frequent urination to prevent bladder overfilling  Iron  deficiency anemia (etiology under evaluation) Iron  deficiency anemia under evaluation with recent iron  infusions. Referral to ENT for evaluation of epistaxis as a potential source of blood loss. Gastroenterologist involved in ongoing evaluation. Potential further testing discussed if iron  levels remain low. - Continue follow-up with gastroenterologist and ENT for further evaluation  Type 2 diabetes mellitus without complications No reported symptoms of diabetes. Monitoring through blood work. - Obtain blood work to monitor diabetes control - on metformin  xr 750 daily  Allergic rhinitis Persistent cough and throat clearing, likely due to allergies, worsening with outdoor exposure. Currently taking Allegra. Discussed switching to Zyrtec  if symptoms persist and adding nasal spray for symptom relief. - Consider switching from Allegra to Zyrtec  if symptoms persist - Prescribe nasal spray for symptom relief  Costochondritis Chest discomfort, likely costochondritis, with tenderness at the costosternal junction. Symptoms may be related to recent physical activity. Condition is self-limiting and not concerning. - Recommend anti-inflammatory medication for pain management  Adult Wellness Visit Visit included discussion of multiple health concerns and personal issues. General health and wellness discussed. - Check blood pressure occasionally at home and report numbers - Obtain blood work to ensure all parameters are within normal limits    Follow up: 6 mo for htn and dm recheck Orders Placed This Encounter  Procedures   TSH   VITAMIN D 25 Hydroxy (Vit-D  Deficiency, Fractures)   CBC with Differential/Platelet   Comprehensive metabolic panel with GFR   Lipid panel   Vitamin B12   Hemoglobin A1c   Meds ordered this encounter  Medications   solifenacin (VESICARE) 10 MG tablet    Sig: Take 1 tablet (10 mg total) by mouth daily.    Dispense:  90 tablet    Refill:  3   fluticasone (FLONASE) 50 MCG/ACT nasal spray    Sig: Place 1 spray into both nostrils daily.    Dispense:  16 g    Refill:  6      Body mass index is 37.86 kg/m. Wt Readings from Last 3 Encounters:  09/24/24 249 lb (112.9 kg)  09/20/24 245 lb (111.1 kg)  09/07/24 249 lb (112.9 kg)     Patient Active Problem List   Diagnosis Date Noted   Low serum low density lipoprotein (LDL) 03/17/2024    Priority: High    Statin intolerant. No indication.    Iron  deficiency anemia 06/03/2023    Priority: High    Thought due to gastric erosions; healed 2024    Type 2 diabetes mellitus treated without insulin (HCC) 06/20/2021    Priority: High    Crestor  intolerant; severe myalgias; does not want other statins. LDL is naturally low.  Lab Results  Component Value Date   CHOL 104 08/07/2022   HDL 64.40 08/07/2022   LDLCALC 25 08/07/2022   TRIG 73.0 08/07/2022   CHOLHDL 2 08/07/2022       White coat syndrome without diagnosis of hypertension 01/16/2018    Priority: High   Severe obesity (  BMI 35.0-35.9 with comorbidity) (HCC)     Priority: High   Acquired hypothyroidism 01/07/2011    Priority: High   Overactive bladder 09/24/2024    Priority: Medium     Vesicare 09/2024    Mixed stress and urge urinary incontinence 09/24/2024    Priority: Medium    Tubular adenoma of colon 07/10/2023    Priority: Medium     Colonoscopy July 2024, repeat in 7 years    Myalgia due to statin 02/17/2023    Priority: Medium    Long-term current use of proton pump inhibitor therapy 11/12/2022    Priority: Medium    Primary osteoarthritis of left knee 01/05/2019    Priority:  Medium    Family history of colon cancer 01/16/2018    Priority: Medium     Grandparents, elderly    Family history of breast cancer 01/16/2018    Priority: Medium     Mom and MGM, 80s    GERD (gastroesophageal reflux disease) 06/02/2014    Priority: Medium    Irritable bowel syndrome 01/07/2011    Priority: Medium    Fatty liver 01/07/2011    Priority: Medium    Vitamin B12 deficiency 02/17/2023    Priority: Low    On monthly vit B12 injections    Encounter for vitamin deficiency screening 08/30/2020    Priority: Low    Dexa 08/2020; normal. Recheck in 5 years    Chronic allergic rhinitis 12/30/2019    Priority: Low   Thrombocytopenia 01/07/2019    Priority: Low    Chronic by lab review    Health Maintenance  Topic Date Due   DTaP/Tdap/Td (4 - Td or Tdap) 09/20/2024   Mammogram  10/09/2024   COVID-19 Vaccine (6 - 2025-26 season) 10/10/2024 (Originally 08/09/2024)   Influenza Vaccine  11/04/2024 (Originally 07/09/2024)   Diabetic kidney evaluation - Urine ACR  03/17/2025   HEMOGLOBIN A1C  03/25/2025   OPHTHALMOLOGY EXAM  05/11/2025   DEXA SCAN  08/29/2025   Medicare Annual Wellness (AWV)  09/20/2025   Diabetic kidney evaluation - eGFR measurement  09/24/2025   FOOT EXAM  09/24/2025   Colonoscopy  07/08/2030   Pneumococcal Vaccine: 50+ Years  Completed   Hepatitis C Screening  Completed   Zoster Vaccines- Shingrix   Completed   Meningococcal B Vaccine  Aged Out   Immunization History  Administered Date(s) Administered   Fluad Quad(high Dose 65+) 10/02/2021, 10/07/2022   Influenza Whole 10/09/2010   Influenza,inj,Quad PF,6+ Mos 09/20/2014, 09/29/2017, 10/05/2018, 09/15/2019   Influenza-Unspecified 10/02/2016, 09/29/2017, 08/18/2023   Moderna Covid-19 Vaccine Bivalent Booster 33yrs & up 09/18/2021   Moderna SARS-COV2 Booster Vaccination 10/07/2022   Moderna Sars-Covid-2 Vaccination 12/30/2019, 02/04/2020, 10/17/2020   PNEUMOCOCCAL CONJUGATE-20 03/17/2024    Pneumococcal Conjugate-13 09/15/2019   Pneumococcal Polysaccharide-23 01/16/2018   Td 12/09/1998, 01/07/2011   Tdap 09/20/2014   Unspecified SARS-COV-2 Vaccination 08/18/2023   Varicella Zoster Immune Globulin 10/07/2014   Zoster Recombinant(Shingrix ) 01/16/2018, 10/05/2018   Zoster, Live 09/08/2014   We updated and reviewed the patient's past history in detail and it is documented below. Allergies: Patient is allergic to propoxyphene n-acetaminophen . Past Medical History Patient  has a past medical history of Cancer (HCC), Diabetes mellitus without complication (HCC), GERD (gastroesophageal reflux disease), Headache(784.0), Hypothyroidism, Irritable bowel syndrome (IBS), Primary osteoarthritis of left knee (01/05/2019), and Thrombopenia (01/07/2019). Past Surgical History Patient  has a past surgical history that includes Abdominal hysterectomy; cyst removed; Flexible sigmoidoscopy (07/23/2011); Colonoscopy (N/A, 06/19/2015); Appendectomy (1991); Tonsillectomy and adenoidectomy; Colonoscopy  with propofol  (N/A, 09/20/2020); Colonoscopy with propofol  (N/A, 07/09/2023); Esophagogastroduodenoscopy (egd) with propofol  (N/A, 07/09/2023); polypectomy (07/09/2023); Hot hemostasis (07/09/2023); and Hemostasis clip placement (07/09/2023). Family History: Patient family history includes Atrial fibrillation (age of onset: 24) in her mother; Breast cancer (age of onset: 81) in her maternal grandmother and mother; Colon cancer (age of onset: 82) in her maternal uncle; Colon cancer (age of onset: 45) in her maternal aunt. Social History:  Patient  reports that she has never smoked. She has never used smokeless tobacco. She reports that she does not currently use alcohol. She reports that she does not use drugs.  Review of Systems: Constitutional: negative for fever or malaise Ophthalmic: negative for photophobia, double vision or loss of vision Cardiovascular: negative for chest pain, dyspnea on exertion, or new  LE swelling Respiratory: negative for SOB or persistent cough Gastrointestinal: negative for abdominal pain, change in bowel habits or melena Genitourinary: negative for dysuria or gross hematuria, no abnormal uterine bleeding or disharge Musculoskeletal: negative for new gait disturbance or muscular weakness Integumentary: negative for new or persistent rashes, no breast lumps Neurological: negative for TIA or stroke symptoms Psychiatric: negative for SI or delusions Allergic/Immunologic: negative for hives  Patient Care Team    Relationship Specialty Notifications Start End  Jodie Lavern CROME, MD PCP - General Family Medicine  01/16/18   Golda Claudis PENNER, MD (Inactive)  Gastroenterology  06/18/13   Ivin Kocher, MD  Dermatology  06/18/13   Elsa Lonni SAUNDERS, MD Consulting Physician Orthopedic Surgery  01/05/19   Dr. Nivia Consulting Physician Ophthalmology  01/05/19    Comment: in [place]    Objective  Vitals: BP 136/78   Pulse 68   Temp 97.7 F (36.5 C)   Ht 5' 8 (1.727 m)   Wt 249 lb (112.9 kg)   SpO2 98%   BMI 37.86 kg/m  General:  Well developed, well nourished, no acute distress  Psych:  Alert and orientedx3,normal mood and affect HEENT:  Normocephalic, atraumatic, non-icteric sclera,  supple neck without adenopathy, mass or thyromegaly Cardiovascular:  Normal S1, S2, RRR without gallop, rub or murmur Respiratory:  Good breath sounds bilaterally, CTAB with normal respiratory effort Gastrointestinal: normal bowel sounds, soft, non-tender, no noted masses. No HSM MSK: extremities without edema, joints without erythema or swelling Neurologic:    Mental status is normal.  Gross motor and sensory exams are normal.  No tremor  Commons side effects, risks, benefits, and alternatives for medications and treatment plan prescribed today were discussed, and the patient expressed understanding of the given instructions. Patient is instructed to call or message via MyChart if he/she  has any questions or concerns regarding our treatment plan. No barriers to understanding were identified. We discussed Red Flag symptoms and signs in detail. Patient expressed understanding regarding what to do in case of urgent or emergency type symptoms.  Medication list was reconciled, printed and provided to the patient in AVS. Patient instructions and summary information was reviewed with the patient as documented in the AVS. This note was prepared with assistance of Dragon voice recognition software. Occasional wrong-word or sound-a-like substitutions may have occurred due to the inherent limitations of voice recognition software

## 2024-09-27 NOTE — Patient Instructions (Signed)
 Please return in 6 months to recheck diabetes and blood pressure   I will release your lab results to you on your MyChart account with further instructions. You may see the results before I do, but when I review them I will send you a message with my report or have my assistant call you if things need to be discussed. Please reply to my message with any questions. Thank you!   If you have any questions or concerns, please don't hesitate to send me a message via MyChart or call the office at [phone]. Thank you for visiting with us  today! It's our pleasure caring for you.    VISIT SUMMARY: Today, you were seen for concerns about urinary incontinence, a persistent cough, a new skin lesion, chest discomfort, and a history of iron  deficiency and epistaxis. We also reviewed your hypertension and diabetes management.  YOUR PLAN: -MIXED URINARY INCONTINENCE (STRESS AND URGE): You have both urgency and stress incontinence, meaning you experience a sudden urge to urinate and leakage when you cough or bend over. This can be due to decreased estrogen levels and an overactive bladder. We will start you on medication for overactive bladder, and you should perform Kegel exercises to strengthen your pelvic floor muscles. Try to urinate frequently to prevent your bladder from overfilling.  -IRON  DEFICIENCY ANEMIA (ETIOLOGY UNDER EVALUATION): You have low iron  levels, which can cause fatigue and weakness. We are continuing to evaluate the cause, including a referral to an ENT specialist to check for any sources of blood loss. Please continue your follow-ups with the gastroenterologist and ENT specialist.  -TYPE 2 DIABETES MELLITUS WITHOUT COMPLICATIONS: Your diabetes is currently without complications. We will monitor your condition through regular blood work to ensure it remains under control.  -ALLERGIC RHINITIS: Your persistent cough and throat clearing are likely due to allergies. If your symptoms persist,  consider switching from Allegra to Zyrtec . We will also prescribe a nasal spray to help relieve your symptoms.  -COSTOCHONDRITIS: Your chest discomfort is likely due to costochondritis, which is inflammation of the cartilage in your chest. This condition is usually self-limiting and not a cause for concern. You can take anti-inflammatory medication to manage the pain.  -ADULT WELLNESS VISIT: We discussed your overall health and wellness, including your blood pressure and general health maintenance. Please check your blood pressure occasionally at home and report the numbers. We will also obtain blood work to ensure all parameters are within normal limits.  INSTRUCTIONS: Please follow up with your gastroenterologist and ENT specialist for further evaluation of your iron  deficiency anemia. Check your blood pressure occasionally at home and report the numbers. Obtain blood work to monitor your diabetes and general health parameters.                      Contains text generated by Abridge.                                 Contains text generated by Abridge.

## 2024-09-29 ENCOUNTER — Ambulatory Visit: Admitting: Family Medicine

## 2024-09-29 ENCOUNTER — Encounter: Payer: Self-pay | Admitting: Family Medicine

## 2024-09-29 VITALS — BP 124/80 | HR 76 | Temp 97.7°F | Ht 66.0 in | Wt 248.6 lb

## 2024-09-29 DIAGNOSIS — E559 Vitamin D deficiency, unspecified: Secondary | ICD-10-CM

## 2024-09-29 DIAGNOSIS — K760 Fatty (change of) liver, not elsewhere classified: Secondary | ICD-10-CM | POA: Diagnosis not present

## 2024-09-29 DIAGNOSIS — E1165 Type 2 diabetes mellitus with hyperglycemia: Secondary | ICD-10-CM

## 2024-09-29 DIAGNOSIS — R7989 Other specified abnormal findings of blood chemistry: Secondary | ICD-10-CM | POA: Diagnosis not present

## 2024-09-29 DIAGNOSIS — Z7984 Long term (current) use of oral hypoglycemic drugs: Secondary | ICD-10-CM

## 2024-09-29 MED ORDER — METFORMIN HCL ER 750 MG PO TB24: 1500.0000 mg | ORAL_TABLET | Freq: Every day | ORAL | 3 refills | Status: AC

## 2024-09-29 NOTE — Progress Notes (Signed)
 Subjective  CC:  Chief Complaint  Patient presents with   Diabetes   vitamin d deficiency    HPI: Shelly [name] is a 68 y.o. female who presents to the office today for follow up of diabetes and problems listed above in the chief complaint.  Discussed the use of AI scribe software for clinical note transcription with the patient, who gave verbal consent to proceed.  History of Present Illness Shelly [name] is a 68 year old female with diabetes who presents with elevated blood sugar levels.  Hyperglycemia and diabetes management - Recent HbA1c level of 8.9% - Elevated blood glucose attributed to increased consumption of sweets and stress eating - Currently taking a low dose of metformin  once daily without significant side effects - Previously took 500 mg of metformin  twice daily now on 750 XR daily - Has started reducing intake of sweets - Frequent thirst and urination - Decreased appetite - Altered taste perception, with sweet foods described as 'almost too sweet' - No longer craves foods as previously, except for ice cream  Gastrointestinal symptoms - History of stomach issues considered baseline for her - No significant gastrointestinal side effects from metformin   Hepatic laboratory abnormalities - Liver function tests have been elevated but stable - No alcohol consumption - Minimal use of Tylenol  - Questions whether liver issues could be related to iron  infusions  Dietary modifications - Managing husband's heart issues has led to dietary changes - Reduction in salt intake    Wt Readings from Last 3 Encounters:  09/29/24 248 lb 9.6 oz (112.8 kg)  09/24/24 249 lb (112.9 kg)  09/20/24 245 lb (111.1 kg)    BP Readings from Last 3 Encounters:  09/29/24 124/80  09/24/24 136/78  09/09/24 (!) 128/58    Assessment  1. Uncontrolled type 2 diabetes mellitus with hyperglycemia (HCC)   2. Low serum low density lipoprotein (LDL)   3. Fatty  liver   4. Elevated LFTs   5. Vitamin D deficiency   6. Morbid obesity (HCC)      Plan  Assessment and Plan Assessment & Plan Type 2 diabetes mellitus with hyperglycemia Poorly controlled with an HbA1c of 8.9% due to increased sugar intake from stress and social events. Currently on a low dose of metformin , which is well-tolerated. Discussed GLP-1 receptor agonists like Mounjaro or Ozempic for metabolic control, weight loss, and reducing food cravings. SGLT2 inhibitors were considered but may not be ideal due to bladder concerns. Prefers to try increasing metformin  dosage and dietary modifications first. - Increase metformin  to 1500 mg daily - Advise dietary modifications to reduce sugar intake and processed carbohydrates - Encourage monitoring of symptoms such as thirst and frequent urination - Schedule follow-up appointment in three months to reassess diabetes control and repeat blood tests - Discuss potential for earlier follow-up if symptoms worsen  Morbid obesity due to excess calories Exacerbated by recent stress eating and social events. Discussed the role of GLP-1 receptor agonists in aiding weight loss and reducing food cravings. Motivated to improve diet. - Advise dietary modifications focusing on reducing sugar and processed carbohydrates - Encourage consumption of vegetables, healthy grains, and proteins - Discuss potential benefits of GLP-1 receptor agonists for weight management if dietary changes are insufficient  Fatty liver disease with abnormal liver function tests Elevated liver function tests, possibly due to fatty infiltration. Denies significant alcohol use and takes minimal Tylenol . Liver function tests have been elevated. Discussed the potential impact of improved diabetes control and  dietary changes on liver health. - Monitor liver function tests in three months  Vitamin D deficiency Vitamin D deficiency noted. Discussed the importance of vitamin D supplementation  for overall health. - Recommend vitamin D supplementation    Follow up: 3 months to recheck diabetes and elevated LFTs No orders of the defined types were placed in this encounter.  Meds ordered this encounter  Medications   metFORMIN  (GLUCOPHAGE -XR) 750 MG 24 hr tablet    Sig: Take 2 tablets (1,500 mg total) by mouth daily with breakfast.    Dispense:  180 tablet    Refill:  3      Immunization History  Administered Date(s) Administered   Fluad Quad(high Dose 65+) 10/02/2021, 10/07/2022   Fluad Trivalent(High Dose 65+) 09/25/2024   Influenza Whole 10/09/2010   Influenza,inj,Quad PF,6+ Mos 09/20/2014, 09/29/2017, 10/05/2018, 09/15/2019   Influenza-Unspecified 10/02/2016, 09/29/2017, 08/18/2023   Moderna Covid-19 Vaccine Bivalent Booster 17yrs & up 09/18/2021, 09/25/2024   Moderna SARS-COV2 Booster Vaccination 10/07/2022   Moderna Sars-Covid-2 Vaccination 12/30/2019, 02/04/2020, 10/17/2020   PNEUMOCOCCAL CONJUGATE-20 03/17/2024   Pneumococcal Conjugate-13 09/15/2019   Pneumococcal Polysaccharide-23 01/16/2018   Td 12/09/1998, 01/07/2011   Tdap 09/20/2014   Unspecified SARS-COV-2 Vaccination 08/18/2023   Varicella Zoster Immune Globulin 10/07/2014   Zoster Recombinant(Shingrix ) 01/16/2018, 10/05/2018   Zoster, Live 09/08/2014    Diabetes Related Lab Review: Lab Results  Component Value Date   HGBA1C 8.9 (H) 09/24/2024   HGBA1C 6.4 (A) 03/17/2024   HGBA1C 6.5 09/17/2023    Lab Results  Component Value Date   MICROALBUR 1.0 03/17/2024   Lab Results  Component Value Date   CREATININE 0.69 09/24/2024   BUN 9 09/24/2024   NA 138 09/24/2024   K 4.6 09/24/2024   CL 102 09/24/2024   CO2 27 09/24/2024   Lab Results  Component Value Date   CHOL 179 09/24/2024   CHOL 123 09/17/2023   CHOL 104 08/07/2022   Lab Results  Component Value Date   HDL 56.80 09/24/2024   HDL 61.10 09/17/2023   HDL 64.40 08/07/2022   Lab Results  Component Value Date   LDLCALC 93  09/24/2024   LDLCALC 40 09/17/2023   LDLCALC 25 08/07/2022   Lab Results  Component Value Date   TRIG 144.0 09/24/2024   TRIG 106.0 09/17/2023   TRIG 73.0 08/07/2022   Lab Results  Component Value Date   CHOLHDL 3 09/24/2024   CHOLHDL 2 09/17/2023   CHOLHDL 2 08/07/2022   No results found for: LDLDIRECT The 10-year ASCVD risk score (Arnett DK, et al., 2019) is: 16%   Values used to calculate the score:     Age: 73 years     Clincally relevant sex: Female     Is Non-Hispanic African American: No     Diabetic: Yes     Tobacco smoker: No     Systolic Blood Pressure: 124 mmHg     Is BP treated: No     HDL Cholesterol: 56.8 mg/dL     Total Cholesterol: 179 mg/dL I have reviewed the PMH, Fam and Soc history. Patient Active Problem List   Diagnosis Date Noted   Low serum low density lipoprotein (LDL) 03/17/2024    Priority: High    Statin intolerant. No indication.    Iron  deficiency anemia 06/03/2023    Priority: High    Thought due to gastric erosions; healed 2024    Type 2 diabetes mellitus treated without insulin (HCC) 06/20/2021    Priority: High  Crestor  intolerant; severe myalgias; does not want other statins. LDL is naturally low.  Lab Results  Component Value Date   CHOL 104 08/07/2022   HDL 64.40 08/07/2022   LDLCALC 25 08/07/2022   TRIG 73.0 08/07/2022   CHOLHDL 2 08/07/2022       White coat syndrome without diagnosis of hypertension 01/16/2018    Priority: High   Morbid obesity (HCC)     Priority: High   Acquired hypothyroidism 01/07/2011    Priority: High   Overactive bladder 09/24/2024    Priority: Medium     Vesicare 09/2024    Mixed stress and urge urinary incontinence 09/24/2024    Priority: Medium    Tubular adenoma of colon 07/10/2023    Priority: Medium     Colonoscopy July 2024, repeat in 7 years    Myalgia due to statin 02/17/2023    Priority: Medium    Long-term current use of proton pump inhibitor therapy 11/12/2022     Priority: Medium    Primary osteoarthritis of left knee 01/05/2019    Priority: Medium    Family history of colon cancer 01/16/2018    Priority: Medium     Grandparents, elderly    Family history of breast cancer 01/16/2018    Priority: Medium     Mom and MGM, 80s    GERD (gastroesophageal reflux disease) 06/02/2014    Priority: Medium    Irritable bowel syndrome 01/07/2011    Priority: Medium    Fatty liver 01/07/2011    Priority: Medium    Vitamin B12 deficiency 02/17/2023    Priority: Low    On monthly vit B12 injections    Encounter for vitamin deficiency screening 08/30/2020    Priority: Low    Dexa 08/2020; normal. Recheck in 5 years    Chronic allergic rhinitis 12/30/2019    Priority: Low   Thrombocytopenia 01/07/2019    Priority: Low    Chronic by lab review; seeing hematology: 09/2024  Mild thrombocytopenia since 2015 - US  abdomen (09/25/2020): Probable fatty infiltration of the liver. - US  spleen (08/30/2024): No evidence of splenomegaly. -- She self-administers monthly B12 at home - Hematology workup: Normal B12 and folate.  Normal copper .  Normal MMA. Hepatitis B and hepatitis C negative. Low-normal immature platelet fraction 2.8% - Most recent CBC/D (08/30/2024) with progressive thrombocytopenia, platelets now down to 96 - PLAN:  We will check additional labs to include SPEP, immunofixation, light chains, flow cytometry, ANA, RF, anti-CCP, platelet antibody panel - Continued surveillance of platelets.  If persistent platelets <100 without other cause, we will check bone marrow biopsy.  Would also consider repeat liver imaging.  Otherwise, suspect mild chronic ITP or thrombocytopenia related to fatty liver disease.    Vitamin D deficiency 09/29/2024   Elevated LFTs 09/29/2024    Social History: Patient  reports that she has never smoked. She has never used smokeless tobacco. She reports that she does not currently use alcohol. She reports that she does not use  drugs.  Review of Systems: Ophthalmic: negative for eye pain, loss of vision or double vision Cardiovascular: negative for chest pain Respiratory: negative for SOB or persistent cough Gastrointestinal: negative for abdominal pain Genitourinary: negative for dysuria or gross hematuria MSK: negative for foot lesions Neurologic: negative for weakness or gait disturbance  Objective  Vitals: BP 124/80   Pulse 76   Temp 97.7 F (36.5 C)   Ht 5' 6 (1.676 m)   Wt 248 lb 9.6 oz (112.8 kg)  SpO2 94%   BMI 40.13 kg/m  General: well appearing, no acute distress  Psych:  Alert and oriented, normal mood and affect  Diabetic education: ongoing education regarding chronic disease management for diabetes was given today. We continue to reinforce the ABC's of diabetic management: A1c (<7 or 8 dependent upon patient), tight blood pressure control, and cholesterol management with goal LDL < 100 minimally. We discuss diet strategies, exercise recommendations, medication options and possible side effects. At each visit, we review recommended immunizations and preventive care recommendations for diabetics and stress that good diabetic control can prevent other problems. See below for this patient's data. Commons side effects, risks, benefits, and alternatives for medications and treatment plan prescribed today were discussed, and the patient expressed understanding of the given instructions. Patient is instructed to call or message via MyChart if he/she has any questions or concerns regarding our treatment plan. No barriers to understanding were identified. We discussed Red Flag symptoms and signs in detail. Patient expressed understanding regarding what to do in case of urgent or emergency type symptoms.  Medication list was reconciled, printed and provided to the patient in AVS. Patient instructions and summary information was reviewed with the patient as documented in the AVS. This note was prepared with  assistance of Dragon voice recognition software. Occasional wrong-word or sound-a-like substitutions may have occurred due to the inherent limitations of voice recognition software

## 2024-09-29 NOTE — Patient Instructions (Signed)
 Please return in 3 months for diabetes follow up   If you have any questions or concerns, please don't hesitate to send me a message via MyChart or call the office at [phone]. Thank you for visiting with us  today! It's our pleasure caring for you.    VISIT SUMMARY: Today, we discussed your elevated blood sugar levels and overall diabetes management. We also reviewed your gastrointestinal symptoms, liver function, and dietary habits. Additionally, we talked about your weight and vitamin D levels.  YOUR PLAN: -TYPE 2 DIABETES MELLITUS WITH HYPERGLYCEMIA: This means your blood sugar levels are higher than normal, which can be due to increased sugar intake and stress. We will increase your metformin  dosage to 1500 mg daily and recommend dietary changes to reduce sugar and processed carbohydrates. Please monitor symptoms like thirst and frequent urination. We will reassess your diabetes control and repeat blood tests in three months, but contact us  sooner if your symptoms worsen.  -MORBID OBESITY DUE TO EXCESS CALORIES: This means you have a high body weight due to consuming more calories than your body needs, which has been worsened by stress eating. We recommend dietary changes to reduce sugar and processed carbohydrates, and to increase your intake of vegetables, healthy grains, and proteins. We also discussed the potential benefits of medications that can help with weight loss if dietary changes are not enough.  -FATTY LIVER DISEASE WITH ABNORMAL LIVER FUNCTION TESTS: This means there is excess fat in your liver, which can affect its function. Improving your diabetes control and making dietary changes can help improve your liver health. We will monitor your liver function tests again in three months.  -VITAMIN D DEFICIENCY: This means you have low levels of vitamin D, which is important for your overall health. We recommend taking vitamin D supplements.  INSTRUCTIONS: Please follow up in three  months to reassess your diabetes control and repeat blood tests. Contact us  sooner if your symptoms worsen.                      Contains text generated by [employer]bridge.                                 Contains text generated by [employer]bridge.

## 2024-10-05 ENCOUNTER — Encounter: Payer: Self-pay | Admitting: Family Medicine

## 2024-10-12 ENCOUNTER — Ambulatory Visit
Admission: RE | Admit: 2024-10-12 | Discharge: 2024-10-12 | Disposition: A | Source: Ambulatory Visit | Attending: Family Medicine | Admitting: Family Medicine

## 2024-10-12 DIAGNOSIS — Z1231 Encounter for screening mammogram for malignant neoplasm of breast: Secondary | ICD-10-CM | POA: Diagnosis not present

## 2024-10-20 ENCOUNTER — Encounter (INDEPENDENT_AMBULATORY_CARE_PROVIDER_SITE_OTHER): Payer: Self-pay | Admitting: Gastroenterology

## 2024-11-02 ENCOUNTER — Encounter (INDEPENDENT_AMBULATORY_CARE_PROVIDER_SITE_OTHER): Payer: Self-pay | Admitting: Otolaryngology

## 2024-11-02 ENCOUNTER — Ambulatory Visit (INDEPENDENT_AMBULATORY_CARE_PROVIDER_SITE_OTHER): Admitting: Otolaryngology

## 2024-11-02 VITALS — BP 134/82 | HR 84 | Temp 97.9°F | Ht 68.0 in | Wt 245.0 lb

## 2024-11-02 DIAGNOSIS — R040 Epistaxis: Secondary | ICD-10-CM | POA: Diagnosis not present

## 2024-11-02 NOTE — Progress Notes (Signed)
 CC: Recurrent left epistaxis  Discussed the use of AI scribe software for clinical note transcription with the patient, who gave verbal consent to proceed.  History of Present Illness Shelly [name] is a 68 year old female who presents with recurrent epistaxis on the left side.  She has been experiencing recurrent epistaxis on the left side intermittently for about a year. The frequency varies, with some days having two episodes and then going a month without any. The bleeding occurs both anteriorly and posteriorly, sometimes without her immediate awareness.  She has a history of low iron  levels and has undergone four iron  infusions. A previous workup included a colonoscopy and upper endoscopy, which revealed two bleeding lesions in her stomach that were repaired, but this did not resolve her low iron  levels.  She denies use of blood thinners and has no history of nasal or sinus surgery. She recalls a significant nasal injury during her teenage years while playing basketball. Her last episode of epistaxis occurred last Friday.    Past Medical History:  Diagnosis Date   Cancer (HCC)    Basal Cell    Diabetes mellitus without complication (HCC)    GERD (gastroesophageal reflux disease)    Headache(784.0)    Hypothyroidism    Irritable bowel syndrome (IBS)    Primary osteoarthritis of left knee 01/05/2019   Thrombopenia 01/07/2019   Chronic by lab review    Past Surgical History:  Procedure Laterality Date   ABDOMINAL HYSTERECTOMY     APPENDECTOMY  1991   COLONOSCOPY N/A 06/19/2015   Procedure: COLONOSCOPY;  Surgeon: Claudis RAYMOND Rivet, MD;  [facility]: [facility];  Service: Endoscopy;  Laterality: N/A;  8:30-9:30   COLONOSCOPY WITH PROPOFOL  N/A 09/20/2020   Procedure: COLONOSCOPY WITH PROPOFOL ;  Surgeon: Rivet Claudis RAYMOND, MD;  Location: [facility];  Service: Endoscopy;  Laterality: N/A;  1015   COLONOSCOPY WITH PROPOFOL  N/A 07/09/2023   Procedure: COLONOSCOPY WITH  PROPOFOL ;  Surgeon: Eartha Angelia Sieving, MD;  Location: [facility];  Service: Gastroenterology;  Laterality: N/A;  8:45AM;ASA 2   cyst removed     right arm   ESOPHAGOGASTRODUODENOSCOPY (EGD) WITH PROPOFOL  N/A 07/09/2023   Procedure: ESOPHAGOGASTRODUODENOSCOPY (EGD) WITH PROPOFOL ;  Surgeon: Eartha Angelia Sieving, MD;  Location: [facility];  Service: Gastroenterology;  Laterality: N/A;  8:45AM;ASA 2   FLEXIBLE SIGMOIDOSCOPY  07/23/2011   Procedure: FLEXIBLE SIGMOIDOSCOPY;  Surgeon: Claudis RAYMOND Rivet, MD;  [facility]: [facility];  Service: Endoscopy;  Laterality: N/A;   HEMOSTASIS CLIP PLACEMENT  07/09/2023   Procedure: HEMOSTASIS CLIP PLACEMENT;  Surgeon: Eartha Angelia Sieving, MD;  Location: [facility];  Service: Gastroenterology;;   HOT HEMOSTASIS  07/09/2023   Procedure: HOT HEMOSTASIS (ARGON PLASMA COAGULATION/BICAP);  Surgeon: Eartha Angelia, Sieving, MD;  Location: [facility];  Service: Gastroenterology;;   POLYPECTOMY  07/09/2023   Procedure: POLYPECTOMY;  Surgeon: Eartha Angelia Sieving, MD;  [facility]: [facility];  Service: Gastroenterology;;   TONSILLECTOMY AND ADENOIDECTOMY      Family History  Problem Relation Age of Onset   Breast cancer Mother 27   Atrial fibrillation Mother 23       s/p ablation   Breast cancer Maternal Grandmother 41   Colon cancer Maternal Aunt 80   Colon cancer Maternal Uncle 38    Social History:  reports that she has never smoked. She has never used smokeless tobacco. She reports that she does not currently use alcohol. She reports that she does not use  drugs.  Allergies:  Allergies  Allergen Reactions   Propoxyphene N-Acetaminophen  Hives and Palpitations    Prior to Admission medications   Medication Sig Start Date End Date Taking? Authorizing Provider  Artificial Tear Solution (SOOTHE XP OP) Place 1 drop into both eyes 3 (three) times daily as needed (dry/irritated eyes.).   Yes [provider]   cyanocobalamin  (VITAMIN B12) 1000 MCG/ML injection inject 1 milliliter (1000 microgram total) into the muscle every 30 days. 08/24/24  Yes Jodie Lavern CROME, MD  fexofenadine (ALLEGRA) 180 MG tablet Take 180 mg by mouth in the morning.   Yes [provider]  fluticasone (FLONASE) 50 MCG/ACT nasal spray Place 1 spray into both nostrils daily. 09/24/24  Yes Jodie Lavern CROME, MD  hyoscyamine  (LEVSIN  SL) 0.125 MG SL tablet place 1 tablet under the tongue 3 times daily as needed 08/17/24  Yes Jodie Lavern CROME, MD  ibuprofen (ADVIL) 200 MG tablet Take 400 mg by mouth every 8 (eight) hours as needed (pain.).   Yes [provider]  levothyroxine  (SYNTHROID ) 100 MCG tablet Take 1 tablet (100 mcg total) by mouth daily before breakfast. 08/27/24  Yes Jodie Lavern CROME, MD  metFORMIN  (GLUCOPHAGE -XR) 750 MG 24 hr tablet Take 2 tablets (1,500 mg total) by mouth daily with breakfast. 09/29/24  Yes Jodie Lavern CROME, MD  omeprazole  (PRILOSEC) 20 MG capsule Take 1 capsule (20 mg total) by mouth 2 (two) times daily before a meal. 11/25/23  Yes Carlan, Chelsea L, NP  solifenacin (VESICARE) 10 MG tablet Take 1 tablet (10 mg total) by mouth daily. 09/24/24  Yes Jodie Lavern CROME, MD  Tuberculin-Allergy Syringes [place] TUBERCULIN SAFETY SYR) 25G X 1 1 ML MISC use to inject cyanocobalamin  monthly. 08/25/24  Yes Jodie Lavern CROME, MD    Blood pressure 134/82, pulse 84, temperature 97.9 F (36.6 C), temperature source Oral, height 5' 8 (1.727 m), weight 245 lb (111.1 kg), SpO2 94%. Exam: General: Communicates without difficulty, well nourished, no acute distress. Head: Normocephalic, no evidence injury, no tenderness, facial buttresses intact without stepoff. Face/sinus: No tenderness to palpation and percussion. Facial movement is normal and symmetric. Eyes: PERRL, EOMI. No scleral icterus, conjunctivae clear. Neuro: CN II exam reveals vision grossly intact.  No nystagmus at any point of gaze. Ears: Auricles well  formed without lesions.  Ear canals are intact without mass or lesion.  No erythema or edema is appreciated.  The TMs are intact without fluid. Nose: External evaluation reveals normal support and skin without lesions.  Dorsum is intact.  Anterior rhinoscopy reveals hypervascular areas on the left nasal septum.  Oral:  Oral cavity and oropharynx are intact, symmetric, without erythema or edema.  Mucosa is moist without lesions. Neck: Full range of motion without pain.  There is no significant lymphadenopathy.  No masses palpable.  Thyroid  bed within normal limits to palpation.  Parotid glands and submandibular glands equal bilaterally without mass.  Trachea is midline. Neuro:  CN 2-12 grossly intact.   Procedure:  Endoscopic control of recurrent left epistaxis. Indication:  Recurrent epistaxis  Description:  The left nasal cavity is sprayed with topical xylocaine  and neo-synephrine.  After adequate anesthesia is achieved, the nasal cavity is examined with a 0 rigid endoscope.  A suction catheter is inserted in parallel with the 0 endoscope, and it is used to suction blood clots from the nasal cavity.  Several hypervascular areas are noted on the anterior and superior portion of the septum. A silver nitrate stick is inserted in  parallel with the 0 endoscope.  It is used to repeatedly cauterized the hypervascular areas.  Good hemostasis is achieved.  The patient tolerated the procedure well.    Assessment and Plan Assessment & Plan Recurrent left-sided epistaxis Intermittent left-sided epistaxis for approximately one year, with episodes occurring randomly. Bleeding occurs both anteriorly and posteriorly. No history of nasal or sinus surgery, but a significant nasal trauma from a sports injury in 11. No current use of anticoagulants.  - Endoscopic cauterization of the left nasal septum. - Advised use of acetaminophen  or ibuprofen for discomfort post-procedure. - Instructed on pinching the nose at the  correct location for 10 minutes if bleeding occurs. - Recommended Afrin nasal spray to constrict blood vessels if bleeding occurs. - Advised use of a humidifier in the bedroom during winter. - Scheduled follow-up appointment in one month to assess healing and check for further bleeding.   Piper Hassebrock W Rosario Kushner 11/02/2024, 2:34 PM

## 2024-11-10 DIAGNOSIS — H02886 Meibomian gland dysfunction of left eye, unspecified eyelid: Secondary | ICD-10-CM | POA: Diagnosis not present

## 2024-11-10 DIAGNOSIS — H02883 Meibomian gland dysfunction of right eye, unspecified eyelid: Secondary | ICD-10-CM | POA: Diagnosis not present

## 2024-11-22 ENCOUNTER — Other Ambulatory Visit: Payer: Self-pay | Admitting: Family Medicine

## 2024-11-22 DIAGNOSIS — K589 Irritable bowel syndrome without diarrhea: Secondary | ICD-10-CM

## 2024-12-06 ENCOUNTER — Other Ambulatory Visit (INDEPENDENT_AMBULATORY_CARE_PROVIDER_SITE_OTHER): Payer: Self-pay | Admitting: Gastroenterology

## 2024-12-31 ENCOUNTER — Ambulatory Visit (INDEPENDENT_AMBULATORY_CARE_PROVIDER_SITE_OTHER): Admitting: Otolaryngology

## 2024-12-31 VITALS — BP 126/81 | HR 80

## 2024-12-31 DIAGNOSIS — R040 Epistaxis: Secondary | ICD-10-CM

## 2024-12-31 NOTE — Progress Notes (Signed)
 Patient ID: Shelly [name], female   DOB: Jul 18, 1956, 69 y.o.   MRN: [id number]  Procedure:  Endoscopic control of recurrent left epistaxis  Indication: The patient is a 69 year old female who returns today for her follow-up evaluation.  She was last seen in November 2025.  At that time, she was complaining of recurrent left epistaxis.  She was treated with endoscopic cauterization of her left nasal septum.  The patient presents today reporting improvement in the frequency and severity of her epistaxis.  She still has occasional bleeding on the left side.  She denies any recent nasal trauma.  Description:  The left nasal cavity is sprayed with topical xylocaine  and neo-synephrine.  After adequate anesthesia is achieved, the nasal cavity is examined with a 0 rigid endoscope.  A suction catheter is inserted in parallel with the 0 endoscope, and it is used to suction blood clots from the left nasal cavity.  Hypervascular areas are again noted at the anterior and superior aspect of the nasal septum.  A silver nitrate stick is inserted in parallel with the 0 endoscope.  It is used to repeatedly cauterized the hypervascular areas.  Good hemostasis is achieved.  The patient tolerated the procedure well.  Assessment: 1.  Hypervascular areas are again noted on the left anterior and superior nasal septum.  The severity has decreased. 2.  No suspicious mass or lesion is noted.  Plan: 1. Endoscopic cauterization of the left anterior and superior nasal septum. 2. The nasal endoscopy findings are reviewed with the patient. 3. Nasal ointment/humidifier to treat the nasal dryness. 4. The patient is encouraged to call with any questions or concerns.

## 2025-01-11 ENCOUNTER — Inpatient Hospital Stay: Attending: Physician Assistant

## 2025-01-11 DIAGNOSIS — D500 Iron deficiency anemia secondary to blood loss (chronic): Secondary | ICD-10-CM

## 2025-01-11 DIAGNOSIS — D696 Thrombocytopenia, unspecified: Secondary | ICD-10-CM

## 2025-01-11 LAB — CBC WITH DIFFERENTIAL/PLATELET
Abs Immature Granulocytes: 0.03 10*3/uL (ref 0.00–0.07)
Basophils Absolute: 0.1 10*3/uL (ref 0.0–0.1)
Basophils Relative: 1 %
Eosinophils Absolute: 0.2 10*3/uL (ref 0.0–0.5)
Eosinophils Relative: 3 %
HCT: 47.3 % — ABNORMAL HIGH (ref 36.0–46.0)
Hemoglobin: 16.1 g/dL — ABNORMAL HIGH (ref 12.0–15.0)
Immature Granulocytes: 1 %
Lymphocytes Relative: 40 %
Lymphs Abs: 2.4 10*3/uL (ref 0.7–4.0)
MCH: 32.3 pg (ref 26.0–34.0)
MCHC: 34 g/dL (ref 30.0–36.0)
MCV: 94.8 fL (ref 80.0–100.0)
Monocytes Absolute: 0.6 10*3/uL (ref 0.1–1.0)
Monocytes Relative: 10 %
Neutro Abs: 2.8 10*3/uL (ref 1.7–7.7)
Neutrophils Relative %: 45 %
Platelets: 100 10*3/uL — ABNORMAL LOW (ref 150–400)
RBC: 4.99 MIL/uL (ref 3.87–5.11)
RDW: 14.2 % (ref 11.5–15.5)
WBC: 6.1 10*3/uL (ref 4.0–10.5)
nRBC: 0 % (ref 0.0–0.2)

## 2025-01-11 LAB — IRON AND TIBC
Iron: 151 ug/dL (ref 28–170)
Saturation Ratios: 40 % — ABNORMAL HIGH (ref 10.4–31.8)
TIBC: 381 ug/dL (ref 250–450)
UIBC: 230 ug/dL

## 2025-01-11 LAB — FERRITIN: Ferritin: 297 ng/mL (ref 11–307)

## 2025-01-18 ENCOUNTER — Inpatient Hospital Stay: Admitting: Physician Assistant

## 2025-03-25 ENCOUNTER — Ambulatory Visit: Admitting: Family Medicine

## 2025-09-21 ENCOUNTER — Ambulatory Visit

## 2025-09-26 ENCOUNTER — Encounter: Admitting: Family Medicine
